# Patient Record
Sex: Female | Born: 1935 | Race: White | Hispanic: No | Marital: Married | Smoking: Never smoker
Health system: Southern US, Community
[De-identification: ages and names within clinical notes are randomized; demographics above are authoritative.]

## PROBLEM LIST (undated history)

## (undated) DIAGNOSIS — J449 Chronic obstructive pulmonary disease, unspecified: Secondary | ICD-10-CM

## (undated) DIAGNOSIS — C4490 Unspecified malignant neoplasm of skin, unspecified: Secondary | ICD-10-CM

## (undated) DIAGNOSIS — K649 Unspecified hemorrhoids: Secondary | ICD-10-CM

## (undated) DIAGNOSIS — K8050 Calculus of bile duct without cholangitis or cholecystitis without obstruction: Secondary | ICD-10-CM

## (undated) DIAGNOSIS — C50919 Malignant neoplasm of unspecified site of unspecified female breast: Secondary | ICD-10-CM

## (undated) DIAGNOSIS — D696 Thrombocytopenia, unspecified: Secondary | ICD-10-CM

## (undated) DIAGNOSIS — D649 Anemia, unspecified: Secondary | ICD-10-CM

## (undated) DIAGNOSIS — I4891 Unspecified atrial fibrillation: Secondary | ICD-10-CM

## (undated) DIAGNOSIS — Z923 Personal history of irradiation: Secondary | ICD-10-CM

## (undated) DIAGNOSIS — E538 Deficiency of other specified B group vitamins: Secondary | ICD-10-CM

## (undated) DIAGNOSIS — R2681 Unsteadiness on feet: Secondary | ICD-10-CM

## (undated) DIAGNOSIS — K225 Diverticulum of esophagus, acquired: Secondary | ICD-10-CM

## (undated) DIAGNOSIS — H269 Unspecified cataract: Secondary | ICD-10-CM

## (undated) DIAGNOSIS — M810 Age-related osteoporosis without current pathological fracture: Secondary | ICD-10-CM

## (undated) DIAGNOSIS — D72810 Lymphocytopenia: Secondary | ICD-10-CM

## (undated) DIAGNOSIS — C50911 Malignant neoplasm of unspecified site of right female breast: Secondary | ICD-10-CM

## (undated) DIAGNOSIS — D61818 Other pancytopenia: Secondary | ICD-10-CM

## (undated) DIAGNOSIS — J383 Other diseases of vocal cords: Secondary | ICD-10-CM

## (undated) DIAGNOSIS — M1990 Unspecified osteoarthritis, unspecified site: Secondary | ICD-10-CM

## (undated) HISTORY — DX: Unspecified cataract: H26.9

## (undated) HISTORY — DX: Diverticulum of esophagus, acquired: K22.5

## (undated) HISTORY — DX: Other diseases of vocal cords: J38.3

## (undated) HISTORY — DX: Malignant neoplasm of unspecified site of unspecified female breast: C50.919

## (undated) HISTORY — PX: BREAST EXCISIONAL BIOPSY: SUR124

## (undated) HISTORY — DX: Age-related osteoporosis without current pathological fracture: M81.0

## (undated) HISTORY — DX: Unspecified malignant neoplasm of skin, unspecified: C44.90

## (undated) HISTORY — DX: Thrombocytopenia, unspecified: D69.6

## (undated) HISTORY — DX: Other pancytopenia: D61.818

## (undated) HISTORY — PX: ROTATOR CUFF REPAIR: SHX139

## (undated) HISTORY — DX: Unspecified atrial fibrillation: I48.91

## (undated) HISTORY — PX: REPLACEMENT TOTAL KNEE: SUR1224

## (undated) HISTORY — DX: Deficiency of other specified B group vitamins: E53.8

## (undated) HISTORY — DX: Unspecified osteoarthritis, unspecified site: M19.90

## (undated) HISTORY — DX: Unspecified hemorrhoids: K64.9

## (undated) HISTORY — DX: Lymphocytopenia: D72.810

## (undated) HISTORY — DX: Malignant neoplasm of unspecified site of right female breast: C50.911

## (undated) HISTORY — DX: Anemia, unspecified: D64.9

---

## 2003-10-20 ENCOUNTER — Other Ambulatory Visit: Payer: Self-pay

## 2004-11-07 ENCOUNTER — Ambulatory Visit: Payer: Self-pay | Admitting: Unknown Physician Specialty

## 2005-07-04 ENCOUNTER — Ambulatory Visit: Payer: Self-pay | Admitting: Internal Medicine

## 2005-10-08 ENCOUNTER — Ambulatory Visit: Payer: Self-pay | Admitting: Unknown Physician Specialty

## 2006-07-31 ENCOUNTER — Ambulatory Visit: Payer: Self-pay | Admitting: Internal Medicine

## 2006-08-18 ENCOUNTER — Ambulatory Visit: Payer: Self-pay | Admitting: Ophthalmology

## 2006-09-17 ENCOUNTER — Ambulatory Visit: Payer: Self-pay | Admitting: Ophthalmology

## 2006-12-04 ENCOUNTER — Ambulatory Visit: Payer: Self-pay | Admitting: Internal Medicine

## 2006-12-06 ENCOUNTER — Emergency Department: Payer: Self-pay | Admitting: Emergency Medicine

## 2006-12-06 ENCOUNTER — Other Ambulatory Visit: Payer: Self-pay

## 2006-12-11 ENCOUNTER — Ambulatory Visit: Payer: Self-pay | Admitting: Internal Medicine

## 2007-01-01 ENCOUNTER — Other Ambulatory Visit: Payer: Self-pay

## 2007-01-01 ENCOUNTER — Inpatient Hospital Stay: Payer: Self-pay | Admitting: Rheumatology

## 2007-06-22 DIAGNOSIS — C4492 Squamous cell carcinoma of skin, unspecified: Secondary | ICD-10-CM

## 2007-06-22 HISTORY — DX: Squamous cell carcinoma of skin, unspecified: C44.92

## 2007-08-03 ENCOUNTER — Ambulatory Visit: Payer: Self-pay | Admitting: Internal Medicine

## 2007-09-01 ENCOUNTER — Ambulatory Visit: Payer: Self-pay

## 2007-12-14 ENCOUNTER — Ambulatory Visit: Payer: Self-pay | Admitting: Internal Medicine

## 2007-12-31 ENCOUNTER — Ambulatory Visit: Payer: Self-pay | Admitting: General Practice

## 2007-12-31 ENCOUNTER — Other Ambulatory Visit: Payer: Self-pay

## 2008-01-13 ENCOUNTER — Ambulatory Visit: Payer: Self-pay | Admitting: General Practice

## 2008-04-08 ENCOUNTER — Ambulatory Visit: Payer: Self-pay | Admitting: General Practice

## 2008-05-17 DIAGNOSIS — L570 Actinic keratosis: Secondary | ICD-10-CM

## 2008-05-17 HISTORY — DX: Actinic keratosis: L57.0

## 2008-08-03 ENCOUNTER — Ambulatory Visit: Payer: Self-pay | Admitting: Internal Medicine

## 2009-02-06 ENCOUNTER — Ambulatory Visit: Payer: Self-pay | Admitting: Pain Medicine

## 2009-02-21 ENCOUNTER — Ambulatory Visit: Payer: Self-pay | Admitting: Pain Medicine

## 2009-03-07 ENCOUNTER — Ambulatory Visit: Payer: Self-pay | Admitting: Physician Assistant

## 2009-04-26 ENCOUNTER — Ambulatory Visit: Payer: Self-pay | Admitting: Unknown Physician Specialty

## 2009-05-30 ENCOUNTER — Ambulatory Visit: Payer: Self-pay | Admitting: Pain Medicine

## 2009-06-14 ENCOUNTER — Ambulatory Visit: Payer: Self-pay | Admitting: Physician Assistant

## 2009-08-08 DIAGNOSIS — C4491 Basal cell carcinoma of skin, unspecified: Secondary | ICD-10-CM

## 2009-08-08 HISTORY — DX: Basal cell carcinoma of skin, unspecified: C44.91

## 2009-09-06 ENCOUNTER — Ambulatory Visit: Payer: Self-pay | Admitting: Internal Medicine

## 2009-09-19 ENCOUNTER — Ambulatory Visit: Payer: Self-pay | Admitting: Internal Medicine

## 2009-10-02 ENCOUNTER — Ambulatory Visit: Payer: Self-pay | Admitting: Pain Medicine

## 2009-10-17 ENCOUNTER — Ambulatory Visit: Payer: Self-pay | Admitting: Pain Medicine

## 2009-10-24 ENCOUNTER — Ambulatory Visit: Payer: Self-pay | Admitting: Pain Medicine

## 2009-11-07 ENCOUNTER — Ambulatory Visit: Payer: Self-pay | Admitting: Pain Medicine

## 2009-11-07 ENCOUNTER — Other Ambulatory Visit: Payer: Self-pay | Admitting: Pain Medicine

## 2009-11-27 ENCOUNTER — Ambulatory Visit: Payer: Self-pay | Admitting: Pain Medicine

## 2010-01-21 HISTORY — PX: OTHER SURGICAL HISTORY: SHX169

## 2010-02-09 ENCOUNTER — Encounter: Payer: Self-pay | Admitting: Internal Medicine

## 2010-02-14 ENCOUNTER — Ambulatory Visit: Payer: Self-pay | Admitting: Internal Medicine

## 2010-02-21 ENCOUNTER — Encounter: Payer: Self-pay | Admitting: Internal Medicine

## 2010-03-02 ENCOUNTER — Emergency Department: Payer: Self-pay | Admitting: Emergency Medicine

## 2010-03-06 ENCOUNTER — Ambulatory Visit: Payer: Self-pay | Admitting: Internal Medicine

## 2010-03-14 ENCOUNTER — Emergency Department: Payer: Self-pay | Admitting: Emergency Medicine

## 2010-03-23 ENCOUNTER — Encounter: Payer: Self-pay | Admitting: Internal Medicine

## 2010-04-23 ENCOUNTER — Encounter: Payer: Self-pay | Admitting: Internal Medicine

## 2010-09-26 ENCOUNTER — Ambulatory Visit: Payer: Self-pay | Admitting: Internal Medicine

## 2010-12-17 ENCOUNTER — Encounter: Payer: Self-pay | Admitting: Internal Medicine

## 2010-12-23 ENCOUNTER — Encounter: Payer: Self-pay | Admitting: Internal Medicine

## 2011-01-23 ENCOUNTER — Ambulatory Visit: Payer: Self-pay | Admitting: Unknown Physician Specialty

## 2011-03-21 ENCOUNTER — Ambulatory Visit: Payer: Self-pay

## 2011-10-29 DIAGNOSIS — I4891 Unspecified atrial fibrillation: Secondary | ICD-10-CM | POA: Insufficient documentation

## 2011-12-05 ENCOUNTER — Ambulatory Visit: Payer: Self-pay | Admitting: Internal Medicine

## 2012-04-16 ENCOUNTER — Ambulatory Visit: Payer: Self-pay | Admitting: General Practice

## 2012-06-04 ENCOUNTER — Ambulatory Visit: Payer: Self-pay | Admitting: General Practice

## 2012-06-04 DIAGNOSIS — I499 Cardiac arrhythmia, unspecified: Secondary | ICD-10-CM

## 2012-06-04 LAB — BASIC METABOLIC PANEL
BUN: 22 mg/dL — ABNORMAL HIGH (ref 7–18)
Calcium, Total: 9.2 mg/dL (ref 8.5–10.1)
Chloride: 110 mmol/L — ABNORMAL HIGH (ref 98–107)
Co2: 27 mmol/L (ref 21–32)
EGFR (Non-African Amer.): 55 — ABNORMAL LOW
Glucose: 93 mg/dL (ref 65–99)
Osmolality: 288 (ref 275–301)
Potassium: 3.9 mmol/L (ref 3.5–5.1)
Sodium: 143 mmol/L (ref 136–145)

## 2012-06-04 LAB — CBC WITH DIFFERENTIAL/PLATELET
Basophil %: 0.4 %
Eosinophil #: 0.1 10*3/uL (ref 0.0–0.7)
Eosinophil %: 2.4 %
Lymphocyte #: 0.9 10*3/uL — ABNORMAL LOW (ref 1.0–3.6)
MCH: 31.2 pg (ref 26.0–34.0)
MCHC: 33 g/dL (ref 32.0–36.0)
MCV: 94 fL (ref 80–100)
Monocyte #: 0.5 x10 3/mm (ref 0.2–0.9)
Neutrophil #: 2.6 10*3/uL (ref 1.4–6.5)
Neutrophil %: 61.8 %
Platelet: 151 10*3/uL (ref 150–440)
RDW: 12.9 % (ref 11.5–14.5)

## 2012-06-19 ENCOUNTER — Ambulatory Visit: Payer: Self-pay | Admitting: General Practice

## 2012-06-19 LAB — PROTIME-INR: Prothrombin Time: 13.4 secs (ref 11.5–14.7)

## 2012-12-07 ENCOUNTER — Ambulatory Visit: Payer: Self-pay | Admitting: Internal Medicine

## 2012-12-28 ENCOUNTER — Ambulatory Visit: Payer: Self-pay | Admitting: General Practice

## 2012-12-28 LAB — CBC
HCT: 38.3 % (ref 35.0–47.0)
MCH: 31.1 pg (ref 26.0–34.0)
MCHC: 33.2 g/dL (ref 32.0–36.0)
MCV: 94 fL (ref 80–100)
Platelet: 178 10*3/uL (ref 150–440)
RDW: 13.6 % (ref 11.5–14.5)

## 2012-12-28 LAB — URINALYSIS, COMPLETE
Bacteria: NONE SEEN
Bilirubin,UR: NEGATIVE
Blood: NEGATIVE
Glucose,UR: NEGATIVE mg/dL (ref 0–75)
Ketone: NEGATIVE
Leukocyte Esterase: NEGATIVE
Ph: 6 (ref 4.5–8.0)
RBC,UR: NONE SEEN /HPF (ref 0–5)

## 2012-12-28 LAB — BASIC METABOLIC PANEL
BUN: 17 mg/dL (ref 7–18)
Calcium, Total: 8.6 mg/dL (ref 8.5–10.1)
Chloride: 106 mmol/L (ref 98–107)
Co2: 29 mmol/L (ref 21–32)
Creatinine: 0.9 mg/dL (ref 0.60–1.30)
EGFR (Non-African Amer.): >60
Glucose: 78 mg/dL (ref 65–99)
Osmolality: 278 (ref 275–301)
Sodium: 139 mmol/L (ref 136–145)

## 2012-12-28 LAB — PROTIME-INR: Prothrombin Time: 29 secs — ABNORMAL HIGH (ref 11.5–14.7)

## 2012-12-28 LAB — MRSA PCR SCREENING

## 2013-01-11 ENCOUNTER — Inpatient Hospital Stay: Payer: Self-pay | Admitting: General Practice

## 2013-01-11 LAB — PROTIME-INR
INR: 0.9
Prothrombin Time: 12.6 secs (ref 11.5–14.7)

## 2013-01-12 LAB — BASIC METABOLIC PANEL
Anion Gap: 4 — ABNORMAL LOW (ref 7–16)
Calcium, Total: 7.9 mg/dL — ABNORMAL LOW (ref 8.5–10.1)
Chloride: 105 mmol/L (ref 98–107)
Co2: 28 mmol/L (ref 21–32)
Creatinine: 0.92 mg/dL (ref 0.60–1.30)
EGFR (African American): >60
EGFR (Non-African Amer.): 59 — ABNORMAL LOW
Glucose: 91 mg/dL (ref 65–99)
Osmolality: 273 (ref 275–301)
Sodium: 137 mmol/L (ref 136–145)

## 2013-01-12 LAB — PROTIME-INR: Prothrombin Time: 14.4 secs (ref 11.5–14.7)

## 2013-01-12 LAB — PLATELET COUNT: Platelet: 157 10*3/uL (ref 150–440)

## 2013-01-13 LAB — BASIC METABOLIC PANEL
BUN: 10 mg/dL (ref 7–18)
Creatinine: 0.85 mg/dL (ref 0.60–1.30)
EGFR (African American): >60
EGFR (Non-African Amer.): >60
Glucose: 90 mg/dL (ref 65–99)

## 2013-01-13 LAB — PLATELET COUNT: Platelet: 170 10*3/uL (ref 150–440)

## 2013-01-13 LAB — HEMOGLOBIN: HGB: 10.8 g/dL — ABNORMAL LOW (ref 12.0–16.0)

## 2013-01-13 LAB — PROTIME-INR: INR: 2.1

## 2013-09-23 DIAGNOSIS — C50919 Malignant neoplasm of unspecified site of unspecified female breast: Secondary | ICD-10-CM

## 2013-09-23 HISTORY — DX: Malignant neoplasm of unspecified site of unspecified female breast: C50.919

## 2013-09-23 HISTORY — PX: BREAST LUMPECTOMY: SHX2

## 2013-09-23 HISTORY — PX: BREAST EXCISIONAL BIOPSY: SUR124

## 2014-01-07 ENCOUNTER — Ambulatory Visit: Payer: Self-pay | Admitting: Internal Medicine

## 2014-01-10 ENCOUNTER — Other Ambulatory Visit: Payer: Self-pay | Admitting: Internal Medicine

## 2014-01-14 ENCOUNTER — Ambulatory Visit: Payer: Self-pay | Admitting: Internal Medicine

## 2014-01-15 LAB — CULTURE, BLOOD (SINGLE)

## 2014-01-27 ENCOUNTER — Ambulatory Visit: Payer: Self-pay | Admitting: Internal Medicine

## 2014-01-27 HISTORY — PX: OTHER SURGICAL HISTORY: SHX169

## 2014-02-02 LAB — PATHOLOGY REPORT

## 2014-02-08 ENCOUNTER — Ambulatory Visit: Payer: Self-pay | Admitting: Internal Medicine

## 2014-02-09 ENCOUNTER — Ambulatory Visit: Payer: Self-pay | Admitting: Surgery

## 2014-02-17 ENCOUNTER — Ambulatory Visit: Payer: Self-pay | Admitting: Surgery

## 2014-02-17 LAB — PROTIME-INR
INR: 1
Prothrombin Time: 13.4 secs (ref 11.5–14.7)

## 2014-02-21 ENCOUNTER — Ambulatory Visit: Payer: Self-pay | Admitting: Internal Medicine

## 2014-02-22 LAB — PATHOLOGY REPORT

## 2014-03-11 ENCOUNTER — Ambulatory Visit: Payer: Self-pay | Admitting: Internal Medicine

## 2014-03-23 ENCOUNTER — Ambulatory Visit: Payer: Self-pay | Admitting: Internal Medicine

## 2014-03-23 DIAGNOSIS — M810 Age-related osteoporosis without current pathological fracture: Secondary | ICD-10-CM

## 2014-03-23 HISTORY — DX: Age-related osteoporosis without current pathological fracture: M81.0

## 2014-03-31 LAB — CBC CANCER CENTER
Basophil #: 0 x10 3/mm (ref 0.0–0.1)
Basophil %: 0.5 %
Eosinophil #: 0.1 x10 3/mm (ref 0.0–0.7)
Eosinophil %: 3 %
HCT: 37.1 % (ref 35.0–47.0)
HGB: 11.9 g/dL — AB (ref 12.0–16.0)
LYMPHS ABS: 0.8 x10 3/mm — AB (ref 1.0–3.6)
LYMPHS PCT: 20.9 %
MCH: 28.1 pg (ref 26.0–34.0)
MCHC: 32.2 g/dL (ref 32.0–36.0)
MCV: 87 fL (ref 80–100)
Monocyte #: 0.4 x10 3/mm (ref 0.2–0.9)
Monocyte %: 10.3 %
Neutrophil #: 2.5 x10 3/mm (ref 1.4–6.5)
Neutrophil %: 65.3 %
Platelet: 166 x10 3/mm (ref 150–440)
RBC: 4.25 10*6/uL (ref 3.80–5.20)
RDW: 15.5 % — AB (ref 11.5–14.5)
WBC: 3.8 x10 3/mm (ref 3.6–11.0)

## 2014-04-14 LAB — CBC CANCER CENTER
BASOS PCT: 0.7 %
Basophil #: 0 x10 3/mm (ref 0.0–0.1)
EOS ABS: 0.1 x10 3/mm (ref 0.0–0.7)
Eosinophil %: 3.7 %
HCT: 38.2 % (ref 35.0–47.0)
HGB: 12.4 g/dL (ref 12.0–16.0)
LYMPHS ABS: 0.7 x10 3/mm — AB (ref 1.0–3.6)
Lymphocyte %: 20.9 %
MCH: 28.6 pg (ref 26.0–34.0)
MCHC: 32.5 g/dL (ref 32.0–36.0)
MCV: 88 fL (ref 80–100)
MONOS PCT: 10.5 %
Monocyte #: 0.3 x10 3/mm (ref 0.2–0.9)
Neutrophil #: 2.1 x10 3/mm (ref 1.4–6.5)
Neutrophil %: 64.2 %
Platelet: 167 x10 3/mm (ref 150–440)
RBC: 4.35 10*6/uL (ref 3.80–5.20)
RDW: 15.2 % — ABNORMAL HIGH (ref 11.5–14.5)
WBC: 3.3 x10 3/mm — ABNORMAL LOW (ref 3.6–11.0)

## 2014-04-21 LAB — CBC CANCER CENTER
Basophil #: 0 x10 3/mm (ref 0.0–0.1)
Basophil %: 0.6 %
Eosinophil #: 0.1 x10 3/mm (ref 0.0–0.7)
Eosinophil %: 2.6 %
HCT: 38.5 % (ref 35.0–47.0)
HGB: 12.3 g/dL (ref 12.0–16.0)
LYMPHS PCT: 15.9 %
Lymphocyte #: 0.6 x10 3/mm — ABNORMAL LOW (ref 1.0–3.6)
MCH: 28.4 pg (ref 26.0–34.0)
MCHC: 32 g/dL (ref 32.0–36.0)
MCV: 89 fL (ref 80–100)
Monocyte #: 0.3 x10 3/mm (ref 0.2–0.9)
Monocyte %: 8.6 %
Neutrophil #: 2.6 x10 3/mm (ref 1.4–6.5)
Neutrophil %: 72.3 %
Platelet: 168 x10 3/mm (ref 150–440)
RBC: 4.35 10*6/uL (ref 3.80–5.20)
RDW: 15.4 % — ABNORMAL HIGH (ref 11.5–14.5)
WBC: 3.6 x10 3/mm (ref 3.6–11.0)

## 2014-04-23 ENCOUNTER — Ambulatory Visit: Payer: Self-pay | Admitting: Internal Medicine

## 2014-05-03 LAB — CBC CANCER CENTER
Basophil #: 0 x10 3/mm (ref 0.0–0.1)
Basophil %: 0.6 %
Eosinophil #: 0.1 x10 3/mm (ref 0.0–0.7)
Eosinophil %: 3 %
HCT: 37.7 % (ref 35.0–47.0)
HGB: 12.3 g/dL (ref 12.0–16.0)
LYMPHS PCT: 14.9 %
Lymphocyte #: 0.6 x10 3/mm — ABNORMAL LOW (ref 1.0–3.6)
MCH: 29 pg (ref 26.0–34.0)
MCHC: 32.6 g/dL (ref 32.0–36.0)
MCV: 89 fL (ref 80–100)
MONOS PCT: 10.1 %
Monocyte #: 0.4 x10 3/mm (ref 0.2–0.9)
NEUTROS PCT: 71.4 %
Neutrophil #: 2.7 x10 3/mm (ref 1.4–6.5)
Platelet: 151 x10 3/mm (ref 150–440)
RBC: 4.25 10*6/uL (ref 3.80–5.20)
RDW: 15.4 % — AB (ref 11.5–14.5)
WBC: 3.8 x10 3/mm (ref 3.6–11.0)

## 2014-05-03 LAB — HEPATIC FUNCTION PANEL A (ARMC)
ALK PHOS: 70 U/L
AST: 26 U/L (ref 15–37)
Albumin: 3 g/dL — ABNORMAL LOW (ref 3.4–5.0)
Bilirubin,Total: 0.4 mg/dL (ref 0.2–1.0)
SGPT (ALT): 20 U/L
Total Protein: 6.7 g/dL (ref 6.4–8.2)

## 2014-05-03 LAB — CREATININE, SERUM
CREATININE: 1.21 mg/dL (ref 0.60–1.30)
EGFR (Non-African Amer.): 42 — ABNORMAL LOW
GFR CALC AF AMER: 49 — AB

## 2014-05-24 ENCOUNTER — Ambulatory Visit: Payer: Self-pay | Admitting: Internal Medicine

## 2014-08-13 ENCOUNTER — Inpatient Hospital Stay: Payer: Self-pay | Admitting: Internal Medicine

## 2014-08-13 LAB — COMPREHENSIVE METABOLIC PANEL
ALBUMIN: 3.2 g/dL — AB (ref 3.4–5.0)
ANION GAP: 11 (ref 7–16)
AST: 32 U/L (ref 15–37)
Alkaline Phosphatase: 50 U/L
BUN: 12 mg/dL (ref 7–18)
Bilirubin,Total: 0.4 mg/dL (ref 0.2–1.0)
Calcium, Total: 8 mg/dL — ABNORMAL LOW (ref 8.5–10.1)
Chloride: 110 mmol/L — ABNORMAL HIGH (ref 98–107)
Co2: 25 mmol/L (ref 21–32)
Creatinine: 1.08 mg/dL (ref 0.60–1.30)
EGFR (African American): >60
EGFR (Non-African Amer.): 52 — ABNORMAL LOW
Glucose: 123 mg/dL — ABNORMAL HIGH (ref 65–99)
Osmolality: 292 (ref 275–301)
POTASSIUM: 3.3 mmol/L — AB (ref 3.5–5.1)
SGPT (ALT): 22 U/L
SODIUM: 146 mmol/L — AB (ref 136–145)
Total Protein: 6.9 g/dL (ref 6.4–8.2)

## 2014-08-13 LAB — URINALYSIS, COMPLETE
Glucose,UR: NEGATIVE mg/dL (ref 0–75)
KETONE: NEGATIVE
LEUKOCYTE ESTERASE: NEGATIVE
Nitrite: NEGATIVE
Ph: 7 (ref 4.5–8.0)
Protein: NEGATIVE
RBC,UR: <1 /HPF (ref 0–5)
SPECIFIC GRAVITY: 1.003 (ref 1.003–1.030)
Squamous Epithelial: NONE SEEN

## 2014-08-13 LAB — PROTIME-INR
INR: 1.4
Prothrombin Time: 17.3 secs — ABNORMAL HIGH (ref 11.5–14.7)

## 2014-08-13 LAB — APTT: Activated PTT: 33.4 secs (ref 23.6–35.9)

## 2014-08-13 LAB — CBC
HCT: 41.7 % (ref 35.0–47.0)
HGB: 13.6 g/dL (ref 12.0–16.0)
MCH: 30.2 pg (ref 26.0–34.0)
MCHC: 32.7 g/dL (ref 32.0–36.0)
MCV: 93 fL (ref 80–100)
PLATELETS: 141 10*3/uL — AB (ref 150–440)
RBC: 4.5 10*6/uL (ref 3.80–5.20)
RDW: 13.5 % (ref 11.5–14.5)
WBC: 4 10*3/uL (ref 3.6–11.0)

## 2014-08-13 LAB — TSH: THYROID STIMULATING HORM: 5.26 u[IU]/mL — AB

## 2014-08-13 LAB — MAGNESIUM: Magnesium: 1.8 mg/dL

## 2014-08-13 LAB — TROPONIN I

## 2014-08-14 LAB — BASIC METABOLIC PANEL
ANION GAP: 8 (ref 7–16)
BUN: 9 mg/dL (ref 7–18)
CREATININE: 0.9 mg/dL (ref 0.60–1.30)
Calcium, Total: 8 mg/dL — ABNORMAL LOW (ref 8.5–10.1)
Chloride: 114 mmol/L — ABNORMAL HIGH (ref 98–107)
Co2: 25 mmol/L (ref 21–32)
EGFR (African American): >60
EGFR (Non-African Amer.): >60
Glucose: 91 mg/dL (ref 65–99)
Osmolality: 291 (ref 275–301)
Potassium: 3.9 mmol/L (ref 3.5–5.1)
Sodium: 147 mmol/L — ABNORMAL HIGH (ref 136–145)

## 2014-08-14 LAB — CBC WITH DIFFERENTIAL/PLATELET
BASOS ABS: 0 10*3/uL (ref 0.0–0.1)
Basophil %: 0.9 %
Eosinophil #: 0.1 10*3/uL (ref 0.0–0.7)
Eosinophil %: 2.1 %
HCT: 35.6 % (ref 35.0–47.0)
HGB: 11.8 g/dL — AB (ref 12.0–16.0)
LYMPHS PCT: 27.1 %
Lymphocyte #: 0.8 10*3/uL — ABNORMAL LOW (ref 1.0–3.6)
MCH: 30.3 pg (ref 26.0–34.0)
MCHC: 33.2 g/dL (ref 32.0–36.0)
MCV: 92 fL (ref 80–100)
Monocyte #: 0.3 x10 3/mm (ref 0.2–0.9)
Monocyte %: 10 %
NEUTROS ABS: 1.9 10*3/uL (ref 1.4–6.5)
Neutrophil %: 59.9 %
Platelet: 120 10*3/uL — ABNORMAL LOW (ref 150–440)
RBC: 3.89 10*6/uL (ref 3.80–5.20)
RDW: 13.7 % (ref 11.5–14.5)
WBC: 3.1 10*3/uL — ABNORMAL LOW (ref 3.6–11.0)

## 2014-08-14 LAB — TROPONIN I: Troponin-I: 0.02 ng/mL

## 2014-08-14 LAB — PROTIME-INR
INR: 1.6
PROTHROMBIN TIME: 19.1 s — AB (ref 11.5–14.7)

## 2014-08-14 LAB — T4, FREE: Free Thyroxine: 1.12 ng/dL (ref 0.76–1.46)

## 2014-08-15 LAB — PROTIME-INR
INR: 1.8
PROTHROMBIN TIME: 20.5 s — AB (ref 11.5–14.7)

## 2014-08-29 DIAGNOSIS — Z853 Personal history of malignant neoplasm of breast: Secondary | ICD-10-CM | POA: Insufficient documentation

## 2014-11-09 ENCOUNTER — Ambulatory Visit: Payer: Self-pay | Admitting: Internal Medicine

## 2014-11-22 ENCOUNTER — Ambulatory Visit
Admit: 2014-11-22 | Disposition: A | Discharge status: Home / Self Care | Payer: Self-pay | Attending: Internal Medicine | Admitting: Internal Medicine

## 2014-11-23 ENCOUNTER — Ambulatory Visit: Payer: Self-pay | Admitting: Internal Medicine

## 2015-01-09 ENCOUNTER — Ambulatory Visit
Admit: 2015-01-09 | Disposition: A | Discharge status: Home / Self Care | Payer: Self-pay | Attending: Internal Medicine | Admitting: Internal Medicine

## 2015-01-10 NOTE — Op Note (Signed)
PATIENT NAME:  Kelly Bishop, Kelly Bishop MR#:  [id number] DATE OF BIRTH:  March 25, 1932  DATE OF PROCEDURE:  06/19/2012  PREOPERATIVE DIAGNOSIS: Internal derangement of left knee.   POSTOPERATIVE DIAGNOSES:  1. Tear of the posterior horn medial meniscus, left knee.  2. Tear of the anterior and posterior horns of the lateral meniscus, left knee.  3. 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  4. 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   PROCEDURES PERFORMED: Left knee arthroscopy, partial medial and lateral meniscectomies and chondroplasty of all three compartments.   SURGEON: Laurice Record. Holley Bouche., MD  ANESTHESIA: General.   ESTIMATED BLOOD LOSS: Minimal.   TOURNIQUET TIME: Not used.   DRAINS: None.   INDICATIONS FOR SURGERY: The patient is a 81 year old female who has been seen for complaints of left knee pain and swelling. MRI demonstrated findings consistent with meniscal pathology. After discussion of the risks and benefits of surgical intervention, the patient expressed her understanding of the risks and benefits and agreed with plans for surgical intervention.   PROCEDURE IN DETAIL: Patient was brought into the Operating Room and, after adequate general anesthesia was achieved, tourniquet was placed on patient's left thigh and leg was placed in a leg holder. All bony prominences were well padded. Patient's left knee and leg were cleaned and prepped with alcohol and DuraPrep, draped in the usual sterile fashion. A "timeout" was performed as per usual protocol. The anticipated portal sites were injected with 0.25% Marcaine with epinephrine. An anterolateral portal was created and a cannula was inserted. A moderate effusion was evacuated. The scope was inserted and the knee was distended with fluid using the DePuy Mitek pump. The scope was advanced down the medial gutter into the medial compartment of the knee. Under visualization with the scope, an anteromedial  portal was created and a hook probe was inserted. Inspection of the knee demonstrated a tear of the posterior horn of the medial meniscus which was degenerative in nature. The tear was debrided using meniscal punches and a 4.5 mm shaver. Final contouring was performed using the 50 degree ArthroCare wand. Anterior horn of the medial meniscus was visualized and probed and felt to be stable. There were grade 3 changes of chondromalacia involving primarily the medial femoral condyle. These were debrided and contoured using the 50 degree ArthroCare wand. Scope was then advanced into the intercondylar region. Anterior cruciate ligament was visualized and probed and felt to stable. The scope was removed from the anterolateral portal and reinserted via the anteromedial portal so as to better visualize the lateral compartment. There was a complex degenerative tear involving both the posterior and anterior horns of the lateral meniscus. The tears were debrided using meniscal punches and a 4.5 mm shaver. Final contouring was performed using the ArthroCare wand. The rim of meniscus was visualized and probed and felt to be stable. Of note there was a large area of grade 4 chondromalacia involving the majority of lateral tibial plateau and lateral femoral condyle. The margins were debrided and contoured using the ArthroCare wand. The scope was then positioned so as to visualize the patellofemoral articulation. Grade 3 changes of chondromalacia were noted to the patella as well as along the trochlear groove. These areas were debrided and contoured using the ArthroCare wand. Good patellar tracking was noted.   The knee was irrigated with copious amounts of fluid and then suctioned dry. The anterolateral portal was reapproximated using 3-0 nylon. A combination of 0.25% Marcaine with epinephrine and  4 mg of morphine was injected via the scope. The scope was removed and the anteromedial portal was reapproximated using 3-0 nylon. A  sterile dressing was applied followed by application of an ice wrap.   Patient tolerated procedure well. She was transported to the recovery room in stable condition.    ____________________________ Laurice Record. Holley Bouche., MD jph:cms D: 06/19/2012 82:70:78 ET T: 06/19/2012 17:45:58 ET JOB#: [id number]  cc: Jeneen Rinks P. Holley Bouche., MD, <Dictator>  Laurice Record Holley Bouche MD ELECTRONICALLY SIGNED 06/24/2012 18:25

## 2015-01-13 NOTE — Op Note (Signed)
PATIENT NAME:  Kelly Bishop, Kelly Bishop MR#:  [id number] DATE OF BIRTH:  10-24-1931  DATE OF PROCEDURE:  01/11/2013  PREOPERATIVE DIAGNOSIS: Degenerative arthrosis of the left knee.   POSTOPERATIVE DIAGNOSIS: Degenerative arthrosis of the left knee.   PROCEDURE PERFORMED: Left total knee arthroplasty using computer-assisted navigation.   SURGEON: Laurice Record. Holley Bouche., MD  ASSISTANT: Vance Peper, PA  ANESTHESIA: Femoral nerve block and spinal.   ESTIMATED BLOOD LOSS: 50 mL.   FLUIDS REPLACED: 1600 mL of crystalloid.   TOURNIQUET TIME: 79 minutes.   DRAINS: Two medium drains to reinfusion system.   SOFT TISSUE RELEASES: Anterior cruciate ligament, posterior cruciate ligament, deep medial collateral ligament, posterolateral corner and patellofemoral ligament.   IMPLANTS UTILIZED: DePuy PFC Sigma size 2.5 posterior stabilized femoral component (cemented), size 2 MBT tibial component (cemented), 32 mm 3 peg oval dome patella (cemented), and a 10 mm stabilized rotating platform polyethylene insert.   INDICATIONS FOR SURGERY: The patient is a 81 year old female who has been seen for complaints of progressive left knee pain. X-rays demonstrated severe degenerative changes in tricompartmental fashion with relative valgus deformity. After discussion of the risks and benefits of surgical intervention, the patient expressed understanding of the risks and benefits and agreed with plans for surgical intervention.   PROCEDURE IN DETAIL: The patient was brought into the operating room, and after adequate femoral nerve block and spinal anesthesia was achieved, a tourniquet was placed on the patient's upper left thigh. The patient's left knee and leg were cleaned and prepped with alcohol and DuraPrep and draped in the usual sterile fashion. A "timeout" was performed as per usual protocol. The left lower extremity was exsanguinated using an Esmarch, and the tourniquet was inflated to 300 mmHg. An anterior  longitudinal incision was made followed by a standard mid vastus approach. A large effusion was evacuated. The deep fibers of the medial collateral ligament were elevated in a subperiosteal fashion off the medial flare of the tibia so as to maintain a continuous soft tissue sleeve. The patella was subluxed laterally, and the patellofemoral ligament was incised. Inspection of the knee demonstrated severe degenerative changes most notably to the lateral compartment. Anterior and posterior cruciate ligaments were excised. Prominent osteophytes were debrided using a rongeur. Two 4.0 mm Schanz pins were inserted into the femur and into the tibia for attachment of the array of trackers used for computer-assisted navigation. Hip center was identified using circumduction technique. Distal landmarks were mapped using the computer. The distal femur and proximal tibia were mapped using the computer. Distal femoral cutting guide was positioned using computer-assisted navigation so as to achieve 5 degree distal valgus alignment. Cut was performed and verified using the computer. Distal femur was then sized, and it was felt that a size 2.5 femoral component was appropriate. A size 2.5 cutting guide was positioned, and anterior cut was performed and verified using the computer. This was followed by completion of the posterior and chamfer cuts. The cutting guide for the central box was then positioned, and the central box cut was performed.   Attention was then directed to the proximal tibia. Medial and lateral menisci were excised. The extramedullary tibial cutting guide was positioned using computer-assisted navigation so as to achieve 0 degree varus and valgus alignment and 0 degree posterior slope. Cut was performed and verified using the computer. The proximal tibia was sized, and it was felt that a size 2 tibial tray was appropriate. Tibial and femoral trials were inserted, followed by insertion of a  10 mm polyethylene  insert. The knee was felt to be tight laterally. Trial components were removed, and the knee was placed in extension with Moreland retractors in place. The posterolateral corner was carefully released using a combination of electrocautery and Metzenbaum scissors. This allowed for good medial and lateral soft tissue balancing. Trial components were reinserted, followed by insertion of a 10 mm polyethylene trial. Again, excellent medial and lateral soft tissue balancing was appreciated. Finally, the patella was cut and prepared so as to accommodate a 32 mm 3 peg oval dome patella. Patellar trial was placed, and the knee was placed through a range of motion, with excellent patellar tracking appreciated.   The trial components were removed from the femoral side after debridement of posterior osteophytes. Central posthole for the tibial component was reamed, followed by insertion of a keel punch. Tibial trial was then removed. The cut surfaces of bone were irrigated with copious amounts of normal saline with antibiotic solution using pulsatile lavage and then suctioned dry. Polymethylmethacrylate cement was prepared in the usual fashion using a vacuum mixer. Cement was applied to the cut surface of the proximal tibia as well as along the undersurface of a size 2 MBT tibial component. The tibial component was positioned and impacted into place. Excess cement was removed using [employer] fast streamer. Cement was then applied to the cut surface of the femur as well as along the posterior flanges of a size 2.5 posterior stabilized femoral component. Femoral component was positioned and impacted into place. Excess cement was removed using [employer] fast streamer. A 10 mm polyethylene trial was inserted, and the knee was brought into full extension with steady axial pressure applied. Finally, cement was applied to the backside of a 32 mm 3 peg oval dome patella, and the patellar component was positioned and patellar clamp applied. Excess  cement was removed using [employer] fast streamer.   After adequate curing of cement, the tourniquet was deflated after a total tourniquet time of 79 minutes. Hemostasis was achieved using electrocautery. The knee was irrigated with copious amounts of normal saline with antibiotic solution using pulsatile lavage and then suctioned dry. The knee was inspected for any residual cement debris. Then, 30 mL of 0.25% Marcaine with epinephrine was injected along the posterior capsule. A 10 mm stabilized rotating platform polyethylene insert was inserted, and the knee was placed through range of motion. Excellent medial and lateral soft tissue balancing was appreciated both in full extension and in flexion. Excellent patellar tracking was appreciated. Two medium drains were placed in the wound bed and brought out through a separate stab incision to be attached to a reinfusion system. The medial parapatellar portion of the incision was reapproximated using interrupted sutures of #1 Vicryl. The subcutaneous tissue was approximated in layers using first #0 Vicryl, followed by #2-0 Vicryl. Skin was closed with skin staples. A sterile dressing was applied.   The patient tolerated the procedure well. She was transported to the recovery room in stable condition.    ____________________________ Laurice Record. Holley Bouche., MD jph:OSi D: 01/11/2013 21:32:25 ET T: 01/12/2013 07:31:07 ET JOB#: [id number]  cc: Laurice Record. Holley Bouche., MD, <Dictator> Laurice Record Holley Bouche MD ELECTRONICALLY SIGNED 01/17/2013 18:48

## 2015-01-13 NOTE — Discharge Summary (Signed)
PATIENT NAME:  Kelly Bishop, Kelly Bishop MR#:  [id number] DATE OF BIRTH:  Apr 01, 1932  DATE OF ADMISSION:  01/11/2013 DATE OF DISCHARGE:  01/13/2013   ADMITTING DIAGNOSIS: Degenerative arthrosis of the left knee.   DISCHARGE DIAGNOSIS: Degenerative arthrosis of the left knee.   HISTORY OF PRESENT ILLNESS:  The patient is a pleasant 81 year old female who has been followed at [facility] for progression of left knee pain. She had reported a several year history of progressive left knee pain. She had previously undergone a left knee arthroscopy for partial medial and lateral meniscectomy as well as chondroplasty in October 2013. At that time she was noted to have evidence of grade III chondromalacia to the medial patellofemoral compartment as well as grade IV chondromalacia to the lateral compartment. She has continued to have significant lateral joint line pain with weight-bearing activities. The patient had reported some giving way of the knee as well as some mild swelling. She has also noted some crepitus with range of motion. At the time of surgery, she was not using any ambulatory aid. The patient had not seen any significant improvement in her condition despite the use of Synvisc injections. She has been unable to tolerate anti-inflammatory medications due to anticoagulation with Coumadin. She has attempted to continue exercise program but states the pain is limiting her ability to exercise on a regular basis. The patient states the knee pain had progressed to the point that it was significantly interfering with her activities of daily living. X-rays taken in [place] showed narrowing of the lateral compartment space with associated valgus alignment. Osteophyte formation as well as subchondral sclerosis was noted. After discussion of the risks and benefits of surgical intervention, the patient expressed her understanding of the risks and benefits and agreed for plans for surgical  intervention.   PROCEDURE: Left total knee arthroplasty using computer-assisted navigation.   ANESTHESIA: Femoral nerve block with spinal.   SOFT TISSUE RELEASE:  Anterior cruciate ligament, posterior cruciate ligament, deep medial collateral ligaments, as well as the posterolateral corner and the patellofemoral ligament.   IMPLANTS UTILIZED:  DePuy PFC Sigma size 2.5 posterior stabilized femoral component (cemented), size 2 MBT tibial component (cemented), 32 mm 3-pegged oval dome patella (cemented), and a 10 mm stabilized rotating platform polyethylene insert.   HOSPITAL COURSE: The patient tolerated the procedure very well. She had no complications. She was then taken to the PAC-U where she was stabilized and then transferred to the orthopedic floor. The patient began receiving anticoagulation therapy of Lovenox 30 mg subcutaneous every 12 hours per anesthesia protocol as well as Coumadin 10 mg.  Her INR was followed on a daily basis. The INR on the day of discharge was 2.1. Subsequently the Lovenox was discontinued. She was placed back on her regular doses of 6 mg per day. The patient was fitted with TED stockings bilaterally. These are allowed to be removed 1 hour per 8 hour shift. She was also fitted with the AV-I compression foot pumps bilaterally set at 80 mmHg.  Her calves have been nontender. There has been no evidence of any DVTs. Negative Homans sign. Heels were elevated off the bed using rolled towels.   The patient has denied any chest pain or shortness of breath. Vital signs have been stable. She has been afebrile. Hemodynamically she was stable. No transfusions were given other than the Autovac transfusion given the first 6 hours postoperatively.   Physical therapy was initiated on day one for gait training and transfers.  She has done extremely well.  On day 2 she had a range of motion from 0 to 95 with patient ambulating greater than 200 feet. She was able to go up and down 4 sets of  steps. She was independent with bed to chair transfers. Occupational therapy was also initiated on day one for activities of daily living and assistive devices.   The patient's IV, Foley and Hemovac were discontinued on day 2 along with a dressing change. The wound was free of any drainage or signs of infection. There was very minimal swelling. Neurovascular and neurosensory was intact and within normal limits.   DRUG ALLERGIES:  BETA ADRENERGIC AGENTS, SAGE WIPES, which caused a rash, ACE inhibitors, codeine, morphine, peanuts.  MEDICATIONS:  Amiodarone 100 mg daily.  Vitamin D3 2000 units daily.  Aspirin 81 mg one enteric-coated aspirin per day. Claritin 10 mg 1 tablet b.i.d.  Coumadin 6 mg at bedtime.  Dulcolax laxatives 5 mg 2 tablets daily. Lasix 20 mg q.a.m.  Potassium chloride 20 mEq daily. Peri-Colace 50 mg 8.6, 1 tablet daily.  Tramadol 50 to 100 mg every 4 to 6 hours p.r.n. for pain.  Oxycodone 1 to 2 tablets every 4 to 6 hours p.r.n. for pain. Tylenol 500 mg 1 tablet every 4 hours p.r.n. for temperatures and pain p.r.n.  CONDITION ON DISCHARGE:  The patient is being discharged to home in improved stable condition.   DISCHARGE INSTRUCTIONS: 1.  She is to continue weight-bearing as tolerated.  2.  Continue wearing TED stockings the day but may be removed at night.  3.  Continue Polar Care maintaining a temperature of 40 to 50 degrees Fahrenheit pretty much around-the-clock for the first 2 weeks.  4.  She has appointment in the clinic on 01/26/2013 at 10:15.  5.  She is to call the clinic sooner if any temperatures of 101.5 or greater or excessively. 6.  She is to continue using a walker to continue further physical therapy to go to a quad cane.  7.  She will receive home health physical therapy.  8.  She is placed on a regular diet.  9.  She is to resume her regular medication that she was on prior to admission.   PAST MEDICAL HISTORY:  Seasonal allergies, arthritis, cataracts,  osteoporosis, cervical disk disease, probable fibrocystic breast disease, spastic dysphoria, colon adenomas, esophagitis, recurrent atrial fibrillation, right frontal meningioma, hyperlipidemia.     ____________________________ Vance Peper, PA jrw:ct D: 01/13/2013 11:08:01 ET T: 01/13/2013 11:29:00 ET JOB#: [id number]  cc: Vance Peper, PA, <Dictator> Laurice Record. Holley Bouche., MD Marland Reine PA ELECTRONICALLY SIGNED 01/13/2013 14:02

## 2015-01-14 NOTE — H&P (Signed)
PATIENT NAME:  Kelly Bishop, Kelly Bishop MR#:  [id number] DATE OF BIRTH:  June 11, 1932  DATE OF ADMISSION:  08/13/2014  REFERRING PHYSICIAN: Yetta Numbers. Karma Greaser, MD  PRIMARY CARE PHYSICIAN: [name], MD, [facility].   CARDIOLOGIST: Javier Docker Ubaldo Glassing, MD, [facility].   CHIEF COMPLAINT: Palpitations, shortness of breath.   HISTORY OF PRESENT ILLNESS: An 81 year old Caucasian female with a past medical history of paroxysmal atrial fibrillation, hypertension, hyperlipidemia, breast cancer, presenting with palpitations, shortness of breath. She describes the acute onset of palpitations today with associated shortness of breath as well as some associated chest tightness, although she mainly complained of neck tightness over her left neck. No frank chest pain, no further symptomatology. Brought to the hospital for further workup and evaluation. Found to be in atrial fibrillation with rapid ventricular response, heart rate between 150 and 160. Of note, she was taken off amiodarone, her rate-controlling agent, back in August 2015 secondary to an interaction with tamoxifen. She is on no other rate-controlling agents at this time and had no issues until now. She has no further complaints at this time. She has received 15 mg of IV Cardizem x 2 as well as 120 mg p.o. Cardizem. Rate is still around 120 or so; thus, she was started on a Cardizem drip in the Emergency Department.  REVIEW OF SYSTEMS: CONSTITUTIONAL: Denies fevers, chills, fatigue, weakness.  EYES: Denies blurred vision, double vision, eye pain.  EARS, NOSE, THROAT: Denies tinnitus, ear pain, hearing loss.  RESPIRATORY: Denies cough, wheeze. Positive shortness of breath as described above.  CARDIOVASCULAR: Positive for chest tightness as well as palpitations. Denies any orthopnea. Positive for edema, as well.  GASTROINTESTINAL: Denies nausea, vomiting, diarrhea, abdominal pain.  GENITOURINARY: Denies dysuria, hematuria.  ENDOCRINE: Denies nocturia  or thyroid problems. HEMATOLOGY AND LYMPHATIC: Denies easy bruising and bleeding.  SKIN: Denies rashes or lesions.  MUSCULOSKELETAL: Denies pain in neck, back, shoulder, knees, hips, or arthritic symptoms.  NEUROLOGIC: Denies paralysis, paresthesias.  PSYCHIATRIC: Denies anxiety or depressive symptoms.  Otherwise, full review of systems performed by me is negative.   PAST MEDICAL HISTORY: Paroxysmal atrial fibrillation, history of breast cancer, hyperlipidemia, essential hypertension.   SOCIAL HISTORY: Denies any alcohol, tobacco, or drug usage.  FAMILY HISTORY: Positive for coronary artery disease.   ALLERGIES: BETA ADRENERGIC AGENTS, ACE INHIBITORS, CODEINE, MORPHINE, AND PEANUTS.   HOME MEDICATIONS: Include Advil PM 25/200 mg 2 capsules p.o. at bedtime, aspirin 81 mg p.o. at bedtime, warfarin 6 mg p.o. daily, Allegra 180 mg p.o. daily, tamoxifen 20 mg p.o. daily, Ambien 5 mg p.o. at bedtime as needed for sleep, Lasix 40 mg p.o. daily as needed for edema, Senokot-S 50/8.6 mg 2 tabs p.o. at bedtime, potassium 10 mEq p.o. daily while taking the Lasix, Cipro 500 mg p.o. b.i.d. which she states she is on chronically, vitamin D3 2000 international units p.o. daily.   PHYSICAL EXAMINATION:  VITAL SIGNS: Temperature 97.9; heart rate 151, currently 116; respirations 18; blood pressure 110/80; saturating 100% on supplemental oxygen. Weight 65.8 kg, BMI 25.7.  GENERAL: Well-nourished, well-developed, Caucasian female currently in no acute distress.  HEAD: Normocephalic, atraumatic.  EYES: Pupils equal, round, reactive to light. Extraocular muscles intact. No scleral icterus.  MOUTH: Moist mucosal membranes. Dentition intact. No abscess noted. EARS, NOSE, THROAT: Clear without exudates. No external lesions.  NECK: Supple. No thyromegaly. No nodules. No JVD.  PULMONARY: Clear to auscultation bilaterally without wheezes, rales, or rhonchi. No use of accessory muscles. Good respiratory effort.   CHEST:  Nontender to palpation.  CARDIOVASCULAR: S1, S2, irregular rate, irregular rhythm. Tachycardic. No murmurs, rubs, or gallops. 1+ edema to shins bilaterally. Pedal pulses 2+ bilaterally. GASTROINTESTINAL: Soft, nontender, nondistended. No masses. Positive bowel sounds. No hepatosplenomegaly.  MUSCULOSKELETAL: No swelling, clubbing, or edema. Range of motion full in all extremities.  NEUROLOGIC: Cranial nerves II through XII intact. No gross focal neurological deficits. Sensation intact. Reflexes intact.  SKIN: No ulceration, lesions, rash, cyanosis. Skin warm, dry. Turgor intact.  PSYCHIATRIC: Mood and affect within normal limits. Patient awake, alert, oriented x 3. Insight and judgment are intact.   LABORATORY DATA: EKG performed reveals atrial fibrillation with rapid ventricular response, heart rate 150s. Remainder of laboratory data: Sodium 146, potassium 3.3, chloride 110, bicarbonate of 25, BUN 12, creatinine 1.08, glucose 123. LFTs: Albumin 3.2, otherwise within normal limits. Troponin less than 0.02, WBC 4, hemoglobin 13.6, platelets of 141,000. INR of 1.4.    ASSESSMENT AND PLAN: An 81 year old Caucasian female with a history of paroxysmal atrial fibrillation presenting with palpitations, shortness of breath, atrial fibrillation with rapid ventricular response.  1.  Atrial fibrillation with rapid ventricular response. She received Cardizem 50 mg IV x 2 in the Emergency Department as well as 120 mg p.o. She was then subsequently started on Cardizem drip. Goal heart rate will be less than 120 and taken off her rate-control agent, amiodarone, back in August 2015 secondary to interaction with tamoxifen. She will likely need a more chronic agent. She is on warfarin but subtherapeutic. She has received a dose of Lovenox. As well, cardiology consult has been placed. She follows with Dr. Ubaldo Glassing on a regular basis.  2.  Hypokalemia. Replace to goal of 4 to 5.  3.  Breast cancer. Continue with  tamoxifen.  4.  Venous thromboembolism prophylaxis on warfarin; however, subtherapeutic. She received a dosage of Lovenox.  CODE STATUS: Patient is full code.   TIME SPENT: 45 minutes.   ____________________________ Aaron Mose. Tanveer Brammer, MD dkh:ST D: 08/13/2014 01:56:15 ET T: 08/13/2014 23:25:13 ET JOB#: [id number]  cc: Aaron Mose. Adriene Padula, MD, <Dictator> Brennen Gardiner Woodfin Ganja MD ELECTRONICALLY SIGNED 08/14/2014 20:31

## 2015-01-14 NOTE — Consult Note (Signed)
Reason for Visit: This 81 year old Female patient presents to the clinic for initial evaluation of  breast cancer .   Referred by Dr. Tamala Julian.  Diagnosis:  Chief Complaint/Diagnosis   a 81 year old femalewith stage I  (T1 A. N0 M0) well differentiated invasive mammary carcinoma ER/PR positive HER-2/neu not over expressed.of the right breast says is wide local excision and sentinel node biopsy.  Pathology Report pathology report reviewed   Imaging Report mammograms reviewed   Referral Report clinical notes reviewed   Planned Treatment Regimen whole breast radiation plus aromatase inhibitor   HPI   patient is an 81 year old female who presents with an abnormal mammogram of the right breast showing an 8 mm ill-defined mass confirmed on ultrasound suspicious for malignancy.patient underwent ultrasound-guided biopsy which was positive for invasive mammary carcinoma. She underwent a wide local excision for a 4 mm well-differentiated grade 1 invasive mammary carcinoma ER/PR positive HER-2/neu not over expressed. Margins were clear at 2 mm. 2 sentinel lymph nodes were negative for malignancy. Patient has done well postoperatively. 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 She is seen today for consideration of treatment. She will be a candidate for aromatase inhibitor and not a candidate for systemic chemotherapy by opinion of medical oncology. She specifically denies breast tenderness cough or bone pain.  Past Hx:    High cholesterol:    Hypertension:    irregular HB:    back surgery: Dec 2010   Rotator cuff repair  Bilateral:    R knee replacement:    Disk fusion: 2009  Past, Family and Social History:  Past Medical History positive   Cardiovascular hyperlipidemia; hypertension; U. regular rhythm   Past Surgical History rotator cuff repair  bilaterally, right knee replacement, instrumentation and back surgery with disc effusion   Past Medical History Comments spastic dysphonia, osteoporosis, hemorrhoids   Family History noncontributory   Social History noncontributory   Additional Past Medical and Surgical History seen by herself today   Allergies:   Ace Inhibitors: Cough  Codeine: N/V/Diarrhea  Peanuts: Hives  Morphine: Unknown  Beta Adrenergic agents: Unknown  Home Meds:  Home Medications: Medication Instructions Status  tramadol 50 mg oral tablet 1-2 tab(s) orally every 4 hours, As needed, pain for mild to moderate pain Active  aspirin 81 mg oral enteric coated tablet 1 tab(s) orally once a day (at bedtime) Active  warfarin 2.5 mg oral tablet 1 tab(s) orally once a day Active  warfarin 2 mg oral tablet 1 tab(s) orally once a day along with 2.29m tab Active  zolpidem 5 mg oral tablet 1 tab(s) orally once a day (at bedtime), As Needed Active  Senokot S 50 mg-8.6 mg oral tablet 2 tab(s) orally once a day (at bedtime) Active  furosemide 40 mg oral tablet 1 tab(s) orally once a day, As Needed Active  Klor-Con 10 10 mEq oral tablet, extended release 1 tab(s) orally once a day, As Needed, when taking furosemide Active  Advil PM 25 mg-200 mg oral capsule 2 cap(s) orally once a day (at bedtime) Active  Allegra 180 mg oral tablet 1 tab(s) orally once a day (in the morning) Active  Vitamin D3 2000 intl units oral tablet 1 tab(s) orally once a day (in the morning) Active   Review of Systems:  General negative   Performance Status (ECOG) 0   Skin negative  Breast see HPI   Ophthalmologic negative   ENMT negative   Respiratory and Thorax negative   Cardiovascular negative   Gastrointestinal negative   Genitourinary negative   Musculoskeletal negative   Neurological negative   Psychiatric negative   Hematology/Lymphatics negative   Endocrine negative   Allergic/Immunologic negative   Review of  Systems   review of systems obtained from nurses notes  Nursing Notes:  Nursing Vital Signs and Chemo Nursing Nursing Notes: *CC Vital Signs Flowsheet:   22-Jun-15 10:36  Temp Temperature 98  Pulse Pulse 69  Respirations Respirations 21  SBP SBP 153  DBP DBP 80  Current Weight (kg) (kg) 65.9  Height (cm) centimeters 160  BSA (m2) 1.6   Physical Exam:  General/Skin/HEENT:  General normal   Skin normal   Eyes normal   ENMT normal   Head and Neck normal   Additional PE well-developed female in NAD. Right breast is a wide local excision scar which is healing well It is close to the nipple area complex. No dominant mass or nodularity is noted in either breast in 2 positions examined. No axillary or supraclavicular adenopathy is appreciated. Lungs are clear to A&P cardiac examination shows regular rate and rhythm.   Breasts/Resp/CV/GI/GU:  Respiratory and Thorax normal   Cardiovascular normal   Gastrointestinal normal   Genitourinary normal   MS/Neuro/Psych/Lymph:  Musculoskeletal normal   Neurological normal   Lymphatics normal   Other Results:  Radiology Results: LabUnknown:    17-Apr-15 13:52, Screening Digital Mammogram  PACS Image     24-Apr-15 14:19, Digital Additional Views Rt Breast (SCR)  PACS Image   [facility]:  Digital Additional Views Rt Breast (SCR)   REASON FOR EXAM:    av rt mass  COMMENTS:       PROCEDURE: MAM - MAM DIG ADDVIEWS RT SCR  - Jan 14 2014  2:19PM     CLINICAL DATA:  Possible right breast mass    EXAM:  DIGITAL DIAGNOSTIC  RIGHT MAMMOGRAM    ULTRASOUND RIGHT BREAST    COMPARISON:  01/07/2014 and prior studies    ACR Breast Density Category b: There are scattered areas of  fibroglandular density.    FINDINGS:  There is a persistent round ill-defined hyper attenuating 8 mm mass  in the 4 o'clock position of the right breast, associated with or  adjacent to a few benign-appearing calcifications.    On physical exam,  there are no palpable abnormalities.    Ultrasound is performed, showing an irregular anti parallel  hypoechoic shadowing mass 1 cm from the nipple in the 4 o'clock  position of the right breast. There are a few calcifications  associated with the mass. It causes posterior acoustic shadowing. It  measures 5 x 5 x 7 mm. Ultrasound of the right axilla reveals normal  lymph nodes, the largest measuring 2.3 cm and all of which  demonstrate normal fatty hila.     IMPRESSION:  Suspicious right breast mass    RECOMMENDATION:  Ultrasound-guided core needle biopsy has been scheduled    I have discussed the findings and recommendations with the patient.  Results were also provided in writing at the conclusion of the  visit. If applicable, a reminder letter will be sent to the patient  regarding the next appointment.    BI-RADS CATEGORY  4: Suspicious.  Electronically Signed    By: Skipper Cliche M.D.    On: 01/14/2014 15:41  Verified By: Rachael Fee, M.D.,   Relevent Results:   Relevant Scans and Labs mammogram and ultrasound reviewed   Assessment and Plan: Impression:   stage I invasive mammary carcinoma the right breast status post wide local excision and sentinel biopsy ER/PR positive HER-2/neu negative in 81 year old female Plan:   the stomach to go ahead with hypofractionated radiation therapy to her right breast. We'll treat in 16 fractions and then posterior scar a mother and 1600 cGy based on the close margin. Risks and benefits of treatment were reviewed with the patient and she seems to comprehend my treatment plan well. Side effects such as redness of the skin, alteration blood counts, possible occlusion of superficial lung, all were explained in detail to the patient. She seems to comprehend my treatment plan well. I have set her up for CT simulation later this week. Patient will also be a candidate for aromatase inhibitor therapy after completion of  radiation.  I would like to take this opportunity for allowing me to participate in the care of your patient..  CC Referral:  cc: Dr. Tamala Julian, Dr. Emily Filbert   Electronic Signatures: Baruch Gouty, Roda Shutters (MD)  (Signed 22-Jun-15 14:07)  Authored: HPI, Diagnosis, Past Hx, PFSH, Allergies, Home Meds, ROS, Nursing Notes, Physical Exam, Other Results, Relevent Results, Encounter Assessment and Plan, CC Referring Physician   Last Updated: 22-Jun-15 14:07 by Armstead Peaks (MD)

## 2015-01-14 NOTE — Discharge Summary (Signed)
PATIENT NAME:  Kelly Bishop, Kelly Bishop MR#:  [id number] DATE OF BIRTH:  Apr 02, 1932  DATE OF ADMISSION:  08/13/2014 DATE OF DISCHARGE:  08/15/2014  DISCHARGE DIAGNOSES: 1.  Rapid atrial fibrillation, converted to normal sinus rhythm.  2.  Breast cancer.  3.  History of osteomyelitis of spine.  4.  Hypertension.  5.  Hyperlipidemia.   DISCHARGE MEDICATIONS: 1.  Aspirin 81 mg daily. 2.  Ambien 5 mg at bedtime p.r.n.  3.  Senokot daily.  4.  Lasix 40 mg daily p.r.n. 5.  Potassium 10 mEq daily p.r.n.  6.  Allegra 180 mg daily.  7.  Vitamin D3 2000 units daily.  8.  Tamoxifen 20 mg daily. 9.  Warfarin 6 mg at bedtime. 10.  Cipro 500 mg b.i.d. 11.  Diltiazem ER 120 mg daily.   REASON FOR ADMISSION: An 81 year old female who presents with rapid A-fib.  Please see H and P for HPI, past medical history, and physical exam.   HOSPITAL COURSE: The patient was admitted, placed on IV diltiazem drip. She auto converted back to normal rhythm. She is unable to take amiodarone because of her chronic Cipro with her history of osteo of the spine. She was given Cardizem CD 120 mg daily and stayed in normal sinus rhythm on ambulation for 24 hours. She will be discharged home on the above medications for follow up with Dr. Sabra Heck in 1 to 2 weeks.   ____________________________ [name], MD mfm:sb D: 08/15/2014 08:02:54 ET T: 08/15/2014 11:10:17 ET JOB#: [id number]  cc: [name], MD, <Dictator> [name] MD ELECTRONICALLY SIGNED 08/16/2014 8:25

## 2015-01-14 NOTE — Consult Note (Signed)
PATIENT NAME:  Kelly Bishop, Kelly Bishop MR#:  [id number] DATE OF BIRTH:  [birth year]  DATE OF CONSULTATION:  08/14/2014  REFERRING PHYSICIAN:  Aaron Mose. Hower, MD CONSULTING PHYSICIAN:  Corey Skains, MD  REASON FOR CONSULTATION:  1.  Paroxysmal, nonvalvular atrial fibrillation.  2.  Essential hypertension.  3.  Mixed hyperlipidemia needing further treatment options.   CHIEF COMPLAINT: "I have a rapid rate."   HISTORY OF PRESENT ILLNESS: This is an 81 year old female with known nonvalvular paroxysmal atrial fibrillation with an episode of rapid ventricular rate of her atrial fibrillation and palpitations and irregular heartbeat with shortness of breath. This increased, so she was seen in the Emergency Room for these symptoms and, therefore, was placed on a diltiazem drip. At that time, she had electrical cardioversion to normal sinus rhythm and has had no further symptoms with a normal troponin and no evidence of myocardial infarction with better blood pressure control and heart rate control. The patient does have essential hypertension, for which she is on appropriate medication and mixed hyperlipidemia at low risk for cardiovascular issues in the future. The patient has had no current symptoms since conversion to normal sinus rhythm. She has had amiodarone use in the past for this, although with chronic ciprofloxacin use due to osteomyelitis, the patient will have a higher risk of rhythm disturbances as well as interactions of amiodarone and ciprofloxacin causing long QT interval and rhythm disturbances. Therefore, would not use this medication. She has had success with diltiazem and may need a longer daily use of this.   Remainder review of systems negative for vision change, ringing in the ears, hearing loss, cough, congestion, heartburn, nausea, vomiting, diarrhea, bloody stool, stomach pain, extremity pain, leg weakness, cramping of the buttocks, known blood clots, headaches, blackouts, dizzy spells,  nosebleeds, congestion, trouble swallowing, frequent urination, urination at night, muscle weakness, numbness, anxiety, depression, skin lesions, or skin rashes.   PAST MEDICAL HISTORY:  1.  Paroxysmal nonvalvular atrial fibrillation.  2.  Essential hypertension.  3.  Mixed hyperlipidemia.   FAMILY HISTORY: No apparent family members with early onset of cardiovascular disease or hypertension.   SOCIAL HISTORY: She currently denies alcohol or tobacco use.   ALLERGIES: As listed.   MEDICATIONS: As listed.   PHYSICAL EXAMINATION:  VITAL SIGNS: Blood pressure is 110/68 bilaterally. Heart rate is 72 upright, reclining, and regular.  GENERAL: She is a well-appearing female in no acute distress.  HEENT: No icterus, thyromegaly, ulcers, hemorrhage, or xanthelasma.  CARDIOVASCULAR: Regular rate and rhythm. Normal S1 and S2, inferior PMI, but no murmurs. Carotid upstroke normal without bruit. Jugular venous pressure is normal.  LUNGS: Clear to auscultation with normal respirations.  ABDOMEN: Soft, nontender, without hepatosplenomegaly or masses. Abdominal aorta is normal size without bruit.  EXTREMITIES: 2+ radial, femoral, dorsal pedal pulses, with no lower extremity edema, cyanosis, clubbing or ulcers.  NEUROLOGIC: She is oriented to time, place, and person, with normal mood and affect.   ASSESSMENT: An 81 year old female with paroxysmal nonvalvular atrial fibrillation, essential hypertension, mixed hyperlipidemia now converted to normal sinus rhythm, needing further treatment options.   RECOMMENDATIONS:  1.  Continue diltiazem at low dose for maintenance of normal sinus rhythm and heart rate and essential hypertension control. 2.  Consider anticoagulation if the patient would tolerate for intermittent atrial fibrillation due to higher CHADS score, following for other risks of bleeding.  3.  No further cardiac diagnostics necessary at this time.  4.  Avoid amiodarone with Cipro use due to  concerns of QT interval issues and interactions.  5.  Ambulation and possible discharge to home.    ____________________________ Corey Skains, MD bjk:ah D: 08/14/2014 07:46:03 ET T: 08/14/2014 13:01:20 ET JOB#: [id number]  cc: Corey Skains, MD, <Dictator> Corey Skains MD ELECTRONICALLY SIGNED 08/17/2014 8:22

## 2015-01-14 NOTE — Op Note (Signed)
PATIENT NAME:  Kelly Bishop, Kelly Bishop MR#:  [id number] DATE OF BIRTH:  1932-06-15  DATE OF PROCEDURE:  02/17/2014  PREOPERATIVE DIAGNOSIS: Carcinoma of the right breast.   POSTOPERATIVE DIAGNOSIS: Carcinoma of the right breast.   PROCEDURE: Right partial mastectomy with axillary sentinel lymph node biopsy.   SURGEON: Rochel Brome, M.D.   ANESTHESIA: General.   INDICATIONS: This 81 year old female recently had a mammogram depicting a density in the right breast at approximately the 4-o'clock position. Ultrasound also demonstrated this density. She has had needle biopsy demonstrating invasive mammary carcinoma and surgery was recommended for definitive treatment.   DESCRIPTION OF PROCEDURE: The patient was placed on the operating table in the supine position under general anesthesia. The right arm was placed on a lateral arm support. The dressing was removed from the lower aspect of the right breast exposing the Kopans wire, which entered the breast at approximately 4-o'clock position. I could see the thick portion of the Kopans wire protruding out approximately 2 mm. The wire was cut 2 cm from the skin. The right breast, axilla and surrounding chest wall were prepared with ChloraPrep and draped in a sterile manner.   A curvilinear incision was made at made in the inferior medial aspect of the right breast from approximately 3-o'clock to 6 o'clock position and removed an ellipse of skin, which was approximately 8 mm in width in continuity with the underlying tissue. The dissection was carried down to encounter the wire. A portion of tissue surrounding the wire was excised. There was palpable firmness within the tissue, which helped to guide the dissection. The specimen was marked so that the 6-o'clock end of the skin ellipse was tagged with a nylon stitch and also margin maps were attached to the specimen to mark the medial, lateral, cranial, caudal and deep margins for the pathologist's orientation. The  specimen was submitted for specimen mammogram and pathology to inspect for margins. The wound was inspected. Several small bleeding points were cauterized. Hemostasis was subsequently intact. There was no remaining palpable mass within the wound.   Attention was turned to the inferior aspect of the right axilla. The gamma counter was used to demonstrate the location of radioactivity in the inferior aspect of the axilla. An oblique 4 cm incision was made, carried down through subcutaneous tissues, through superficial fascia and dissected down adjacent to the rib cage to encounter radioactivity. The first lymph node encountered was approximately 6 mm in dimension and was dissected free from surrounding structures using electrocautery for hemostasis. The ex vivo count was approximately 50 to 55 counts per second and submitted as sentinel lymph node #1. The gamma counter was again used to further probe the axilla demonstrating another lymph node somewhat deeper to the first adjacent to the rib cage. This deeper lymph node was approximately 1 cm in dimension and was smooth and slightly firm. It was resected with electrocautery with some surrounding fatty tissue. The ex vivo count was approximately 80 to 90 counts per second and this was submitted as sentinel lymph node #2. The background count was less than 5. There was no remaining palpable mass within the axilla.   Both wounds were inspected and hemostasis appeared to be intact. Both wounds were infiltrated with 1% Xylocaine with epinephrine and the subcutaneous tissues. The axillary wound was closed initially placing 4-0 chromic simple sutures in the subcutaneous tissues and then the skin was closed with a running 4-0 Monocryl subcuticular suture. Next, the partial mastectomy wound was further inspected. Subcutaneous  tissues were approximated with interrupted 4-0 chromic. The skin was closed with a running 4-0 Monocryl subcuticular suture.   The radiologist  called to report that the mass in the biopsy marker was seen in satisfactory position within the specimen. Later the pathologist called to report that the margins appeared clear. The closest margin was the skin margin. Next, the wounds were treated with Dermabond and allowed to dry. The patient appeared to be in satisfactory condition and was then prepared for transfer to the recovery room.   ____________________________ Lenna Sciara. Rochel Brome, MD jws:aw D: 02/17/2014 14:07:30 ET T: 02/17/2014 14:46:24 ET JOB#: [id number]  cc: Loreli Dollar, MD, <Dictator> Loreli Dollar MD ELECTRONICALLY SIGNED 02/17/2014 19:30

## 2015-02-01 ENCOUNTER — Other Ambulatory Visit: Payer: Self-pay

## 2015-02-06 ENCOUNTER — Other Ambulatory Visit: Payer: Self-pay

## 2015-02-06 DIAGNOSIS — C50911 Malignant neoplasm of unspecified site of right female breast: Secondary | ICD-10-CM

## 2015-02-07 ENCOUNTER — Inpatient Hospital Stay: Payer: PPO | Attending: Internal Medicine

## 2015-02-07 DIAGNOSIS — Z170 Estrogen receptor positive status [ER+]: Secondary | ICD-10-CM | POA: Insufficient documentation

## 2015-02-07 DIAGNOSIS — C50911 Malignant neoplasm of unspecified site of right female breast: Secondary | ICD-10-CM | POA: Insufficient documentation

## 2015-02-07 DIAGNOSIS — Z79811 Long term (current) use of aromatase inhibitors: Secondary | ICD-10-CM | POA: Diagnosis not present

## 2015-02-07 LAB — CBC WITH DIFFERENTIAL/PLATELET
Basophils Absolute: 0 10*3/uL (ref 0–0.1)
Basophils Relative: 1 %
EOS PCT: 3 %
Eosinophils Absolute: 0.1 10*3/uL (ref 0–0.7)
HCT: 38.6 % (ref 35.0–47.0)
Hemoglobin: 12.6 g/dL (ref 12.0–16.0)
LYMPHS PCT: 26 %
Lymphs Abs: 0.7 10*3/uL — ABNORMAL LOW (ref 1.0–3.6)
MCH: 29.7 pg (ref 26.0–34.0)
MCHC: 32.6 g/dL (ref 32.0–36.0)
MCV: 91 fL (ref 80.0–100.0)
Monocytes Absolute: 0.3 10*3/uL (ref 0.2–0.9)
Monocytes Relative: 9 %
NEUTROS ABS: 1.7 10*3/uL (ref 1.4–6.5)
NEUTROS PCT: 61 %
Platelets: 132 10*3/uL — ABNORMAL LOW (ref 150–440)
RBC: 4.25 MIL/uL (ref 3.80–5.20)
RDW: 13.8 % (ref 11.5–14.5)
WBC: 2.8 10*3/uL — AB (ref 3.6–11.0)

## 2015-02-07 LAB — IRON AND TIBC
Iron: 73 ug/dL (ref 28–170)
Saturation Ratios: 22 % (ref 10.4–31.8)
TIBC: 334 ug/dL (ref 250–450)
UIBC: 261 ug/dL

## 2015-02-07 LAB — FOLATE: FOLATE: 10.7 ng/mL (ref >5.9)

## 2015-02-07 LAB — VITAMIN B12: VITAMIN B 12: 604 pg/mL (ref 180–914)

## 2015-02-08 LAB — INTRINSIC FACTOR ANTIBODIES: Intrinsic Factor: 0.9 AU/mL (ref 0.0–1.1)

## 2015-05-22 ENCOUNTER — Ambulatory Visit: Payer: PPO | Attending: Radiation Oncology | Admitting: Radiation Oncology

## 2015-07-11 ENCOUNTER — Encounter: Payer: Self-pay | Admitting: *Deleted

## 2015-07-11 ENCOUNTER — Other Ambulatory Visit: Payer: Self-pay | Admitting: *Deleted

## 2015-07-11 DIAGNOSIS — C50911 Malignant neoplasm of unspecified site of right female breast: Secondary | ICD-10-CM

## 2015-07-12 ENCOUNTER — Inpatient Hospital Stay: Payer: PPO

## 2015-07-12 ENCOUNTER — Encounter: Payer: Self-pay | Admitting: Internal Medicine

## 2015-07-12 ENCOUNTER — Inpatient Hospital Stay: Payer: PPO | Attending: Internal Medicine | Admitting: Internal Medicine

## 2015-07-12 DIAGNOSIS — C50311 Malignant neoplasm of lower-inner quadrant of right female breast: Secondary | ICD-10-CM

## 2015-07-12 DIAGNOSIS — Z79899 Other long term (current) drug therapy: Secondary | ICD-10-CM

## 2015-07-12 DIAGNOSIS — M810 Age-related osteoporosis without current pathological fracture: Secondary | ICD-10-CM

## 2015-07-12 DIAGNOSIS — I4891 Unspecified atrial fibrillation: Secondary | ICD-10-CM

## 2015-07-12 DIAGNOSIS — Z808 Family history of malignant neoplasm of other organs or systems: Secondary | ICD-10-CM | POA: Insufficient documentation

## 2015-07-12 DIAGNOSIS — Z806 Family history of leukemia: Secondary | ICD-10-CM | POA: Diagnosis not present

## 2015-07-12 DIAGNOSIS — Z923 Personal history of irradiation: Secondary | ICD-10-CM | POA: Diagnosis not present

## 2015-07-12 DIAGNOSIS — Z803 Family history of malignant neoplasm of breast: Secondary | ICD-10-CM | POA: Insufficient documentation

## 2015-07-12 DIAGNOSIS — C50911 Malignant neoplasm of unspecified site of right female breast: Secondary | ICD-10-CM | POA: Diagnosis not present

## 2015-07-12 DIAGNOSIS — Z7982 Long term (current) use of aspirin: Secondary | ICD-10-CM

## 2015-07-12 DIAGNOSIS — Z79810 Long term (current) use of selective estrogen receptor modulators (SERMs): Secondary | ICD-10-CM | POA: Diagnosis not present

## 2015-07-12 DIAGNOSIS — M25552 Pain in left hip: Secondary | ICD-10-CM | POA: Diagnosis not present

## 2015-07-12 DIAGNOSIS — M1990 Unspecified osteoarthritis, unspecified site: Secondary | ICD-10-CM

## 2015-07-12 DIAGNOSIS — Z801 Family history of malignant neoplasm of trachea, bronchus and lung: Secondary | ICD-10-CM | POA: Diagnosis not present

## 2015-07-12 DIAGNOSIS — Z7901 Long term (current) use of anticoagulants: Secondary | ICD-10-CM | POA: Diagnosis not present

## 2015-07-12 DIAGNOSIS — Z85828 Personal history of other malignant neoplasm of skin: Secondary | ICD-10-CM | POA: Diagnosis not present

## 2015-07-12 DIAGNOSIS — Z170 Estrogen receptor positive status [ER+]: Secondary | ICD-10-CM

## 2015-07-12 LAB — CBC WITH DIFFERENTIAL/PLATELET
BASOS ABS: 0 10*3/uL (ref 0–0.1)
BASOS PCT: 1 %
EOS PCT: 1 %
Eosinophils Absolute: 0.1 10*3/uL (ref 0–0.7)
HEMATOCRIT: 37.9 % (ref 35.0–47.0)
Hemoglobin: 12.8 g/dL (ref 12.0–16.0)
Lymphocytes Relative: 20 %
Lymphs Abs: 0.7 10*3/uL — ABNORMAL LOW (ref 1.0–3.6)
MCH: 30.4 pg (ref 26.0–34.0)
MCHC: 33.7 g/dL (ref 32.0–36.0)
MCV: 90.4 fL (ref 80.0–100.0)
MONO ABS: 0.3 10*3/uL (ref 0.2–0.9)
MONOS PCT: 8 %
Neutro Abs: 2.5 10*3/uL (ref 1.4–6.5)
Neutrophils Relative %: 70 %
PLATELETS: 153 10*3/uL (ref 150–440)
RBC: 4.19 MIL/uL (ref 3.80–5.20)
RDW: 13.3 % (ref 11.5–14.5)
WBC: 3.6 10*3/uL (ref 3.6–11.0)

## 2015-07-12 LAB — HEPATIC FUNCTION PANEL
ALT: 18 U/L (ref 14–54)
AST: 29 U/L (ref 15–41)
Albumin: 3.5 g/dL (ref 3.5–5.0)
Alkaline Phosphatase: 36 U/L — ABNORMAL LOW (ref 38–126)
BILIRUBIN DIRECT: 0.2 mg/dL (ref 0.1–0.5)
BILIRUBIN INDIRECT: 0.5 mg/dL (ref 0.3–0.9)
BILIRUBIN TOTAL: 0.7 mg/dL (ref 0.3–1.2)
Total Protein: 6.3 g/dL — ABNORMAL LOW (ref 6.5–8.1)

## 2015-07-12 LAB — CREATININE, SERUM
Creatinine, Ser: 1.16 mg/dL — ABNORMAL HIGH (ref 0.44–1.00)
GFR calc non Af Amer: 43 mL/min — ABNORMAL LOW (ref >60)
GFR, EST AFRICAN AMERICAN: 50 mL/min — AB (ref >60)

## 2015-07-12 NOTE — Progress Notes (Signed)
Pt f/u for breast cancer. Does her own self breast exam and has calcifications that come and go.  Sometimes she has pain in her surgery site and was sent back to sugeon because of it and sometimes she feels the breast is swollen and then it goes away. She states for about 2 weeks and she did not fall and hurt herself. Has appt with PA at orthopedics next week.  assted md with breast exam.

## 2015-07-12 NOTE — Progress Notes (Signed)
[place] OFFICE PROGRESS NOTE  Patient Care Team: No Pcp Per Patient as PCP - General (General Practice)   SUMMARY OF ONCOLOGIC HISTORY:  # 2015- RIGHT BREAST CA STAGE I [T1aN0]ER/PR- POS; Her-2 NEU-NEG; s/p Lumpec & RT [Dr.Crystal] on tamoxifen [given osteoporosis]  # A.fib on Coumadin;   # Osteoporosis   INTERVAL HISTORY:  A very pleasant 81 year old female patient with above history of stage I breast cancer of the right side currently on adjuvant tamoxifen/given history of osteoporosis is here for follow-up.  Patient denies any unusual chest pain or shortness of breath or cough. She has worsening left hip pain; awaiting to see orthopedics the next few days. She has intermittent discomfort in the right breast- swelling/stinging sensation since she had the breast surgery/radiation. This is not getting any worse. This is intermittent. Her last mammogram in April 2016 within normal limits.  REVIEW OF SYSTEMS:  A complete 10 point review of system is done which is negative except mentioned above/history of present illness.   PAST MEDICAL HISTORY :  Past Medical History  Diagnosis Date  . Breast cancer, right breast ([name])     right breast Invasive mammary carcinoma grade 1  . Lymphopenia   . Anemia   . Thrombocytopenia ([place])   . B12 deficiency   . Osteoporosis July 2015    seen on DEXA scan-T score of -2.9 in the left neck femur  . Atrial fibrillation ([place])   . Osteoarthritis   . Spastic dysphonia   . Zenker's diverticulum   . Cataracts, bilateral   . Hemorrhoids   . Skin cancer   . Pancytopenia ([place])   . Breast cancer ([place])     PAST SURGICAL HISTORY :   Past Surgical History  Procedure Laterality Date  . L1-l4 laminectomy and t6 fusion  May 4742    complicated by postoperative infection, more recently had recurrent pseudomonas infection at scar site March 2015  . Ultrasound guided biopsy of breast  Jan 27, 2014    FAMILY HISTORY :   Family History   Problem Relation Age of Onset  . Throat cancer Mother   . Prostate cancer Brother   . Breast cancer Brother   . Leukemia Brother   . Lung cancer Father     SOCIAL HISTORY:   Social History  Substance Use Topics  . Smoking status: Never Smoker   . Smokeless tobacco: Never Used  . Alcohol Use: No    ALLERGIES:  is allergic to ace inhibitors; beta adrenergic blockers; morphine and related; peanut-containing drug products; and codeine.  MEDICATIONS:  Current Outpatient Prescriptions  Medication Sig Dispense Refill  . ALPRAZolam (XANAX) 0.5 MG tablet once daily as needed.     Marland Kitchen amoxicillin (AMOXIL) 500 MG capsule Take by mouth.    . bisacodyl (DULCOLAX) 5 MG EC tablet Take 5 mg by mouth at bedtime. 2 tablets at night    . diltiazem (CARDIZEM) 30 MG tablet Take 30 mg by mouth daily.    . furosemide (LASIX) 20 MG tablet Take by mouth.    . warfarin (COUMADIN) 5 MG tablet Take by mouth.    . warfarin (COUMADIN) 6 MG tablet Take by mouth.    Marland Kitchen aspirin 81 MG chewable tablet Chew by mouth.    . Cholecalciferol (VITAMIN D-1000 MAX ST) 1000 UNITS tablet Take by mouth.    . ciprofloxacin (CIPRO) 500 MG tablet Take by mouth.    . Cyanocobalamin (RA VITAMIN B-12 TR) 1000 MCG TBCR  Take by mouth.    . loratadine (CLARITIN) 10 MG tablet Take by mouth.    . potassium chloride (K-DUR) 10 MEQ tablet Take by mouth.    . tamoxifen (NOLVADEX) 20 MG tablet Take by mouth.    . zolpidem (AMBIEN) 5 MG tablet Take by mouth.     No current facility-administered medications for this visit.    PHYSICAL EXAMINATION: ECOG PERFORMANCE STATUS: 0 - Asymptomatic  There were no vitals taken for this visit.  There were no vitals filed for this visit.  GENERAL: Well-nourished well-developed; Alert, no distress and comfortable.  She is alone. She needs help getting on the exam table. EYES: no pallor or icterus OROPHARYNX: no thrush or ulceration; good dentition  NECK: supple, no masses felt LYMPH:  no  palpable lymphadenopathy in the cervical, axillary or inguinal regions LUNGS: clear to auscultation and  No wheeze or crackles HEART/CVS: regular rate & rhythm and no murmurs; No lower extremity edema ABDOMEN:abdomen soft, non-tender and normal bowel sounds Musculoskeletal:no cyanosis of digits and no clubbing  PSYCH: alert & oriented x 3 with fluent speech NEURO: no focal motor/sensory deficits SKIN:  no rashes or significant lesions Right breast- lumpectomy scar noted; telangiectasias noted from radiation. Otherwise no skin changes noted. Left breast- normal limits.   LABORATORY DATA:  I have reviewed the data as listed    Component Value Date/Time   NA 147* 08/14/2014 0416   K 3.9 08/14/2014 0416   CL 114* 08/14/2014 0416   CO2 25 08/14/2014 0416   GLUCOSE 91 08/14/2014 0416   BUN 9 08/14/2014 0416   CREATININE 1.16* 07/12/2015 1401   CREATININE 0.90 08/14/2014 0416   CALCIUM 8.0* 08/14/2014 0416   PROT 6.3* 07/12/2015 1401   PROT 6.9 08/13/2014 2058   ALBUMIN 3.5 07/12/2015 1401   ALBUMIN 3.2* 08/13/2014 2058   AST 29 07/12/2015 1401   AST 32 08/13/2014 2058   ALT 18 07/12/2015 1401   ALT 22 08/13/2014 2058   ALKPHOS 36* 07/12/2015 1401   ALKPHOS 50 08/13/2014 2058   BILITOT 0.7 07/12/2015 1401   BILITOT 0.4 08/13/2014 2058   GFRNONAA 43* 07/12/2015 1401   GFRNONAA >60 08/14/2014 0416   GFRNONAA 42* 05/03/2014 1105   GFRAA 50* 07/12/2015 1401   GFRAA >60 08/14/2014 0416   GFRAA 49* 05/03/2014 1105    No results found for: SPEP, UPEP  Lab Results  Component Value Date   WBC 3.6 07/12/2015   NEUTROABS 2.5 07/12/2015   HGB 12.8 07/12/2015   HCT 37.9 07/12/2015   MCV 90.4 07/12/2015   PLT 153 07/12/2015      Chemistry      Component Value Date/Time   NA 147* 08/14/2014 0416   K 3.9 08/14/2014 0416   CL 114* 08/14/2014 0416   CO2 25 08/14/2014 0416   BUN 9 08/14/2014 0416   CREATININE 1.16* 07/12/2015 1401   CREATININE 0.90 08/14/2014 0416       Component Value Date/Time   CALCIUM 8.0* 08/14/2014 0416   ALKPHOS 36* 07/12/2015 1401   ALKPHOS 50 08/13/2014 2058   AST 29 07/12/2015 1401   AST 32 08/13/2014 2058   ALT 18 07/12/2015 1401   ALT 22 08/13/2014 2058   BILITOT 0.7 07/12/2015 1401   BILITOT 0.4 08/13/2014 2058       RADIOGRAPHIC STUDIES: I have personally reviewed the radiological images as listed and agreed with the findings in the report. No results found.   ASSESSMENT & PLAN:   #  Right-sided breast cancer stage I ER/PR positive HER-2/neu negative. Patient is currently on adjuvant tamoxifen [given history of osteoporosis]. Tolerating tamoxifen fairly well. Clinically no evidence of recurrence noted.  # A. fib on Coumadin; interaction with tamoxifen with amiodarone. Patient is currently off amiodarone. Patient was educated that if she has issues with current antiarrhythmics/diltiazem; we'll could try putting her aromatase inhibitor. For now defer management for A. fib to cardiology.   # History of osteoporosis; hence currently on tamoxifen not AI. We will contact PCPs office regarding starting patient on oral bisphosphonate; if not responding; then Reclast could be offered.  Patient was asked to call if she had any concerns in the interim.  All questions were answered. The patient knows to call the clinic with any problems, questions or concerns. No barriers to learning was detected.  I spent 15 minutes counseling the patient face to face. The total time spent in the appointment was 30 minutes and more than 50% was on counseling and review of test results     Cammie Sickle, MD 07/12/2015 3:03 PM

## 2015-10-05 DIAGNOSIS — I2510 Atherosclerotic heart disease of native coronary artery without angina pectoris: Secondary | ICD-10-CM | POA: Diagnosis not present

## 2015-10-05 DIAGNOSIS — E785 Hyperlipidemia, unspecified: Secondary | ICD-10-CM | POA: Diagnosis not present

## 2015-10-05 DIAGNOSIS — R079 Chest pain, unspecified: Secondary | ICD-10-CM | POA: Diagnosis not present

## 2015-10-05 DIAGNOSIS — I25119 Atherosclerotic heart disease of native coronary artery with unspecified angina pectoris: Secondary | ICD-10-CM | POA: Diagnosis not present

## 2015-10-05 DIAGNOSIS — Z9861 Coronary angioplasty status: Secondary | ICD-10-CM | POA: Diagnosis not present

## 2015-10-05 DIAGNOSIS — I509 Heart failure, unspecified: Secondary | ICD-10-CM | POA: Diagnosis not present

## 2015-10-05 DIAGNOSIS — I5022 Chronic systolic (congestive) heart failure: Secondary | ICD-10-CM | POA: Diagnosis not present

## 2015-10-05 DIAGNOSIS — I719 Aortic aneurysm of unspecified site, without rupture: Secondary | ICD-10-CM | POA: Diagnosis not present

## 2015-10-05 DIAGNOSIS — I700 Atherosclerosis of aorta: Secondary | ICD-10-CM | POA: Diagnosis not present

## 2015-10-09 DIAGNOSIS — I509 Heart failure, unspecified: Secondary | ICD-10-CM | POA: Diagnosis not present

## 2015-10-09 DIAGNOSIS — E119 Type 2 diabetes mellitus without complications: Secondary | ICD-10-CM | POA: Diagnosis not present

## 2015-10-09 DIAGNOSIS — I2510 Atherosclerotic heart disease of native coronary artery without angina pectoris: Secondary | ICD-10-CM | POA: Diagnosis not present

## 2015-10-09 DIAGNOSIS — N183 Chronic kidney disease, stage 3 (moderate): Secondary | ICD-10-CM | POA: Diagnosis not present

## 2015-10-09 DIAGNOSIS — R0789 Other chest pain: Secondary | ICD-10-CM | POA: Diagnosis not present

## 2015-10-09 DIAGNOSIS — I129 Hypertensive chronic kidney disease with stage 1 through stage 4 chronic kidney disease, or unspecified chronic kidney disease: Secondary | ICD-10-CM | POA: Diagnosis not present

## 2015-10-09 DIAGNOSIS — J449 Chronic obstructive pulmonary disease, unspecified: Secondary | ICD-10-CM | POA: Diagnosis not present

## 2015-10-09 DIAGNOSIS — R079 Chest pain, unspecified: Secondary | ICD-10-CM | POA: Diagnosis not present

## 2015-10-10 DIAGNOSIS — I482 Chronic atrial fibrillation: Secondary | ICD-10-CM | POA: Diagnosis not present

## 2015-10-11 DIAGNOSIS — Z85828 Personal history of other malignant neoplasm of skin: Secondary | ICD-10-CM | POA: Diagnosis not present

## 2015-10-11 DIAGNOSIS — L820 Inflamed seborrheic keratosis: Secondary | ICD-10-CM | POA: Diagnosis not present

## 2015-10-11 DIAGNOSIS — D485 Neoplasm of uncertain behavior of skin: Secondary | ICD-10-CM | POA: Diagnosis not present

## 2015-10-11 DIAGNOSIS — L578 Other skin changes due to chronic exposure to nonionizing radiation: Secondary | ICD-10-CM | POA: Diagnosis not present

## 2015-10-11 DIAGNOSIS — C44619 Basal cell carcinoma of skin of left upper limb, including shoulder: Secondary | ICD-10-CM | POA: Diagnosis not present

## 2015-11-06 DIAGNOSIS — C44619 Basal cell carcinoma of skin of left upper limb, including shoulder: Secondary | ICD-10-CM | POA: Diagnosis not present

## 2015-11-09 DIAGNOSIS — Z79899 Other long term (current) drug therapy: Secondary | ICD-10-CM | POA: Diagnosis not present

## 2015-11-09 DIAGNOSIS — E782 Mixed hyperlipidemia: Secondary | ICD-10-CM | POA: Diagnosis not present

## 2015-11-09 DIAGNOSIS — E538 Deficiency of other specified B group vitamins: Secondary | ICD-10-CM | POA: Diagnosis not present

## 2015-11-09 DIAGNOSIS — I482 Chronic atrial fibrillation: Secondary | ICD-10-CM | POA: Diagnosis not present

## 2015-11-16 ENCOUNTER — Other Ambulatory Visit: Payer: Self-pay | Admitting: Internal Medicine

## 2015-11-16 DIAGNOSIS — G453 Amaurosis fugax: Secondary | ICD-10-CM | POA: Diagnosis not present

## 2015-11-16 DIAGNOSIS — E538 Deficiency of other specified B group vitamins: Secondary | ICD-10-CM | POA: Diagnosis not present

## 2015-11-16 DIAGNOSIS — M810 Age-related osteoporosis without current pathological fracture: Secondary | ICD-10-CM | POA: Diagnosis not present

## 2015-11-16 DIAGNOSIS — I482 Chronic atrial fibrillation: Secondary | ICD-10-CM | POA: Diagnosis not present

## 2015-11-17 ENCOUNTER — Other Ambulatory Visit: Payer: Self-pay | Admitting: Internal Medicine

## 2015-11-20 ENCOUNTER — Other Ambulatory Visit: Payer: Self-pay | Admitting: Internal Medicine

## 2015-11-20 DIAGNOSIS — Z853 Personal history of malignant neoplasm of breast: Secondary | ICD-10-CM

## 2015-11-21 ENCOUNTER — Ambulatory Visit
Admission: RE | Admit: 2015-11-21 | Discharge: 2015-11-21 | Disposition: A | Discharge status: Home / Self Care | Payer: PPO | Source: Ambulatory Visit | Attending: Internal Medicine | Admitting: Internal Medicine

## 2015-11-21 DIAGNOSIS — G453 Amaurosis fugax: Secondary | ICD-10-CM | POA: Diagnosis not present

## 2015-11-21 DIAGNOSIS — I2510 Atherosclerotic heart disease of native coronary artery without angina pectoris: Secondary | ICD-10-CM | POA: Insufficient documentation

## 2015-11-21 DIAGNOSIS — I6523 Occlusion and stenosis of bilateral carotid arteries: Secondary | ICD-10-CM | POA: Diagnosis not present

## 2015-11-23 DIAGNOSIS — I4891 Unspecified atrial fibrillation: Secondary | ICD-10-CM | POA: Diagnosis not present

## 2015-11-23 DIAGNOSIS — I10 Essential (primary) hypertension: Secondary | ICD-10-CM | POA: Diagnosis not present

## 2015-11-23 DIAGNOSIS — E782 Mixed hyperlipidemia: Secondary | ICD-10-CM | POA: Diagnosis not present

## 2015-12-13 DIAGNOSIS — Z85828 Personal history of other malignant neoplasm of skin: Secondary | ICD-10-CM | POA: Diagnosis not present

## 2015-12-13 DIAGNOSIS — L821 Other seborrheic keratosis: Secondary | ICD-10-CM | POA: Diagnosis not present

## 2015-12-13 DIAGNOSIS — L578 Other skin changes due to chronic exposure to nonionizing radiation: Secondary | ICD-10-CM | POA: Diagnosis not present

## 2015-12-13 DIAGNOSIS — L820 Inflamed seborrheic keratosis: Secondary | ICD-10-CM | POA: Diagnosis not present

## 2015-12-26 DIAGNOSIS — Z961 Presence of intraocular lens: Secondary | ICD-10-CM | POA: Diagnosis not present

## 2016-01-10 ENCOUNTER — Inpatient Hospital Stay: Payer: PPO | Attending: Internal Medicine | Admitting: Internal Medicine

## 2016-01-10 ENCOUNTER — Encounter: Payer: Self-pay | Admitting: Internal Medicine

## 2016-01-10 VITALS — BP 182/82 | HR 58 | Temp 97.5°F | Resp 18 | Ht 63.0 in | Wt 144.8 lb

## 2016-01-10 DIAGNOSIS — Z79810 Long term (current) use of selective estrogen receptor modulators (SERMs): Secondary | ICD-10-CM | POA: Diagnosis not present

## 2016-01-10 DIAGNOSIS — G8928 Other chronic postprocedural pain: Secondary | ICD-10-CM | POA: Diagnosis not present

## 2016-01-10 DIAGNOSIS — Z923 Personal history of irradiation: Secondary | ICD-10-CM | POA: Insufficient documentation

## 2016-01-10 DIAGNOSIS — N63 Unspecified lump in breast: Secondary | ICD-10-CM | POA: Diagnosis not present

## 2016-01-10 DIAGNOSIS — Z170 Estrogen receptor positive status [ER+]: Secondary | ICD-10-CM | POA: Diagnosis not present

## 2016-01-10 DIAGNOSIS — Z79899 Other long term (current) drug therapy: Secondary | ICD-10-CM | POA: Insufficient documentation

## 2016-01-10 DIAGNOSIS — M1990 Unspecified osteoarthritis, unspecified site: Secondary | ICD-10-CM | POA: Diagnosis not present

## 2016-01-10 DIAGNOSIS — M25552 Pain in left hip: Secondary | ICD-10-CM | POA: Diagnosis not present

## 2016-01-10 DIAGNOSIS — N644 Mastodynia: Secondary | ICD-10-CM | POA: Diagnosis not present

## 2016-01-10 DIAGNOSIS — G8929 Other chronic pain: Secondary | ICD-10-CM | POA: Insufficient documentation

## 2016-01-10 DIAGNOSIS — Z7982 Long term (current) use of aspirin: Secondary | ICD-10-CM | POA: Diagnosis not present

## 2016-01-10 DIAGNOSIS — M810 Age-related osteoporosis without current pathological fracture: Secondary | ICD-10-CM | POA: Insufficient documentation

## 2016-01-10 DIAGNOSIS — C50911 Malignant neoplasm of unspecified site of right female breast: Secondary | ICD-10-CM | POA: Insufficient documentation

## 2016-01-10 DIAGNOSIS — Z85828 Personal history of other malignant neoplasm of skin: Secondary | ICD-10-CM | POA: Insufficient documentation

## 2016-01-10 DIAGNOSIS — C50011 Malignant neoplasm of nipple and areola, right female breast: Secondary | ICD-10-CM

## 2016-01-10 DIAGNOSIS — I4891 Unspecified atrial fibrillation: Secondary | ICD-10-CM | POA: Insufficient documentation

## 2016-01-10 DIAGNOSIS — Z7901 Long term (current) use of anticoagulants: Secondary | ICD-10-CM | POA: Diagnosis not present

## 2016-01-10 NOTE — Progress Notes (Signed)
[facility] OFFICE PROGRESS NOTE  Patient Care Team: [name], MD as PCP - General (Internal Medicine)   SUMMARY OF ONCOLOGIC HISTORY:  # 2015- RIGHT BREAST CA STAGE I [T1aN0]ER/PR- POS; Her-2 NEU-NEG; s/p Lumpec & RT [Dr.Crystal] on tamoxifen [given osteoporosis]  # A.fib on Coumadin;   # Osteoporosis   INTERVAL HISTORY:  A very pleasant 82 year old female patient with above history of stage I breast cancer of the right side currently on adjuvant tamoxifen/given history of osteoporosis is here for follow-up.  Patient noted to have a small lump in the left breast 3:00 position she notices this morning. She is due for mammogram tomorrow. Otherwise denies any lumps or bumps anywhere else.   She has intermittent discomfort in the right breast- swelling/stinging sensation since she had the breast surgery/radiation. This is not getting any worse. She has chronic left hip pain not any better or worse.  REVIEW OF SYSTEMS:  A complete 10 point review of system is done which is negative except mentioned above/history of present illness.   PAST MEDICAL HISTORY :  Past Medical History  Diagnosis Date  . Breast cancer, right breast ([name])     right breast Invasive mammary carcinoma grade 1  . Lymphopenia   . Anemia   . Thrombocytopenia ([place])   . B12 deficiency   . Osteoporosis July 2015    seen on DEXA scan-T score of -2.9 in the left neck femur  . Atrial fibrillation ([place])   . Osteoarthritis   . Spastic dysphonia   . Zenker's diverticulum   . Cataracts, bilateral   . Hemorrhoids   . Skin cancer   . Pancytopenia ([place])   . Breast cancer ([place])     PAST SURGICAL HISTORY :   Past Surgical History  Procedure Laterality Date  . L1-l4 laminectomy and t6 fusion  May 5883    complicated by postoperative infection, more recently had recurrent pseudomonas infection at scar site March 2015  . Ultrasound guided biopsy of breast  Jan 27, 2014    FAMILY HISTORY :    Family History  Problem Relation Age of Onset  . Throat cancer Mother   . Prostate cancer Brother   . Breast cancer Brother   . Leukemia Brother   . Lung cancer Father     SOCIAL HISTORY:   Social History  Substance Use Topics  . Smoking status: Never Smoker   . Smokeless tobacco: Never Used  . Alcohol Use: No    ALLERGIES:  is allergic to ace inhibitors; beta adrenergic blockers; morphine and related; peanut-containing drug products; and codeine.  MEDICATIONS:  Current Outpatient Prescriptions  Medication Sig Dispense Refill  . ALPRAZolam (XANAX) 0.5 MG tablet once daily as needed.     Marland Kitchen amoxicillin (AMOXIL) 500 MG capsule Take by mouth. Reported on 01/10/2016    . aspirin 81 MG chewable tablet Chew by mouth.    . bisacodyl (DULCOLAX) 5 MG EC tablet Take 5 mg by mouth at bedtime. 2 tablets at night    . Cholecalciferol (VITAMIN D-1000 MAX ST) 1000 UNITS tablet Take by mouth.    . ciprofloxacin (CIPRO) 500 MG tablet Take by mouth.    . Cyanocobalamin (RA VITAMIN B-12 TR) 1000 MCG TBCR Take by mouth.    . diltiazem (CARDIZEM) 30 MG tablet Take 30 mg by mouth daily.    Marland Kitchen diltiazem (TAZTIA XT) 120 MG 24 hr capsule Take 1 tablet by mouth daily.    . furosemide (LASIX)  20 MG tablet Take by mouth.    . potassium chloride (K-DUR) 10 MEQ tablet Take by mouth.    . tamoxifen (NOLVADEX) 20 MG tablet Take by mouth.    . warfarin (COUMADIN) 5 MG tablet Take by mouth.    . warfarin (COUMADIN) 6 MG tablet Take by mouth.    . loratadine (CLARITIN) 10 MG tablet Take by mouth. Reported on 01/10/2016    . zolpidem (AMBIEN) 5 MG tablet Take by mouth. Reported on 01/10/2016     No current facility-administered medications for this visit.    PHYSICAL EXAMINATION: ECOG PERFORMANCE STATUS: 0 - Asymptomatic  BP 182/82 mmHg  Pulse 58  Temp(Src) 97.5 F (36.4 C) (Tympanic)  Resp 18  Ht _0  (1.6 m)  Wt 144 lb 13.5 oz (65.7 kg)  BMI 25.66 kg/m2  SpO2 98%  Filed Weights   01/10/16 1531   Weight: 144 lb 13.5 oz (65.7 kg)    GENERAL: Well-nourished well-developed; Alert, no distress and comfortable.  She is alone. She needs help getting on the exam table.She walks with a cane. EYES: no pallor or icterus OROPHARYNX: no thrush or ulceration; good dentition  NECK: supple, no masses felt LYMPH:  no palpable lymphadenopathy in the cervical, axillary or inguinal regions LUNGS: clear to auscultation and  No wheeze or crackles HEART/CVS: regular rate & rhythm and no murmurs; No lower extremity edema ABDOMEN:abdomen soft, non-tender and normal bowel sounds Musculoskeletal:no cyanosis of digits and no clubbing  PSYCH: alert & oriented x 3 with fluent speech NEURO: no focal motor/sensory deficits SKIN:  Multiple ecchymosis from Coumadin. Right breast- lumpectomy scar noted; telangiectasias noted from radiation. Otherwise no skin changes noted. Left breast-cystic question 1 cm cyst in the 3:00 position. normal limits.   LABORATORY DATA:  I have reviewed the data as listed    Component Value Date/Time   NA 147* 08/14/2014 0416   K 3.9 08/14/2014 0416   CL 114* 08/14/2014 0416   CO2 25 08/14/2014 0416   GLUCOSE 91 08/14/2014 0416   BUN 9 08/14/2014 0416   CREATININE 1.16* 07/12/2015 1401   CREATININE 0.90 08/14/2014 0416   CALCIUM 8.0* 08/14/2014 0416   PROT 6.3* 07/12/2015 1401   PROT 6.9 08/13/2014 2058   ALBUMIN 3.5 07/12/2015 1401   ALBUMIN 3.2* 08/13/2014 2058   AST 29 07/12/2015 1401   AST 32 08/13/2014 2058   ALT 18 07/12/2015 1401   ALT 22 08/13/2014 2058   ALKPHOS 36* 07/12/2015 1401   ALKPHOS 50 08/13/2014 2058   BILITOT 0.7 07/12/2015 1401   BILITOT 0.4 08/13/2014 2058   GFRNONAA 43* 07/12/2015 1401   GFRNONAA >60 08/14/2014 0416   GFRNONAA 42* 05/03/2014 1105   GFRAA 50* 07/12/2015 1401   GFRAA >60 08/14/2014 0416   GFRAA 49* 05/03/2014 1105    No results found for: SPEP, UPEP  Lab Results  Component Value Date   WBC 3.6 07/12/2015   NEUTROABS  2.5 07/12/2015   HGB 12.8 07/12/2015   HCT 37.9 07/12/2015   MCV 90.4 07/12/2015   PLT 153 07/12/2015      Chemistry      Component Value Date/Time   NA 147* 08/14/2014 0416   K 3.9 08/14/2014 0416   CL 114* 08/14/2014 0416   CO2 25 08/14/2014 0416   BUN 9 08/14/2014 0416   CREATININE 1.16* 07/12/2015 1401   CREATININE 0.90 08/14/2014 0416      Component Value Date/Time   CALCIUM 8.0* 08/14/2014 0416  ALKPHOS 36* 07/12/2015 1401   ALKPHOS 50 08/13/2014 2058   AST 29 07/12/2015 1401   AST 32 08/13/2014 2058   ALT 18 07/12/2015 1401   ALT 22 08/13/2014 2058   BILITOT 0.7 07/12/2015 1401   BILITOT 0.4 08/13/2014 2058         ASSESSMENT & PLAN:   # Right-sided breast cancer stage I ER/PR positive HER-2/neu negative. Patient is currently on adjuvant tamoxifen [given history of osteoporosis]. Tolerating tamoxifen fairly well. Clinically no evidence of recurrence noted.  # Left breast- 1 cm 3:00 position likely cyst. Patient is awaiting a mammogram tomorrow. She'll inform us if mammogram is abnormal.  # History of osteoporosis; hence currently on tamoxifen not AI.      Cammie Sickle, MD 01/10/2016 3:52 PM

## 2016-01-11 ENCOUNTER — Telehealth: Payer: Self-pay | Admitting: *Deleted

## 2016-01-11 ENCOUNTER — Ambulatory Visit
Admission: RE | Admit: 2016-01-11 | Discharge: 2016-01-11 | Disposition: A | Discharge status: Home / Self Care | Payer: PPO | Source: Ambulatory Visit | Attending: Internal Medicine | Admitting: Internal Medicine

## 2016-01-11 DIAGNOSIS — Z853 Personal history of malignant neoplasm of breast: Secondary | ICD-10-CM

## 2016-01-11 DIAGNOSIS — N63 Unspecified lump in breast: Secondary | ICD-10-CM | POA: Diagnosis not present

## 2016-01-11 NOTE — Telephone Encounter (Signed)
Patient had mammogram today. Bi-RADs 4. Test ordered under Dr. Emily Filbert. [facility] will reach back to Dr. Sabra Heck to request an order for biopsy.  [place] inquiring whether Dr. Rogue Bussing would be willing to order biopsy if order from Dr. Sabra Heck can not be obtain in sufficient time. I spoke with Dr. Rogue Bussing. Confirmed with [place] that Dr. Rogue Bussing that this will be fine with Dr. Rogue Bussing.

## 2016-01-12 ENCOUNTER — Other Ambulatory Visit: Payer: Self-pay | Admitting: Internal Medicine

## 2016-01-12 DIAGNOSIS — R921 Mammographic calcification found on diagnostic imaging of breast: Secondary | ICD-10-CM

## 2016-01-16 DIAGNOSIS — I482 Chronic atrial fibrillation: Secondary | ICD-10-CM | POA: Diagnosis not present

## 2016-01-22 DIAGNOSIS — I482 Chronic atrial fibrillation: Secondary | ICD-10-CM | POA: Diagnosis not present

## 2016-01-24 ENCOUNTER — Ambulatory Visit
Admission: RE | Admit: 2016-01-24 | Discharge: 2016-01-24 | Disposition: A | Discharge status: Home / Self Care | Payer: PPO | Source: Ambulatory Visit | Attending: Internal Medicine | Admitting: Internal Medicine

## 2016-01-24 DIAGNOSIS — N63 Unspecified lump in breast: Secondary | ICD-10-CM | POA: Diagnosis not present

## 2016-01-24 DIAGNOSIS — R921 Mammographic calcification found on diagnostic imaging of breast: Secondary | ICD-10-CM

## 2016-01-24 DIAGNOSIS — D242 Benign neoplasm of left breast: Secondary | ICD-10-CM | POA: Diagnosis not present

## 2016-01-24 DIAGNOSIS — N6012 Diffuse cystic mastopathy of left breast: Secondary | ICD-10-CM | POA: Diagnosis not present

## 2016-01-24 DIAGNOSIS — N6489 Other specified disorders of breast: Secondary | ICD-10-CM | POA: Diagnosis not present

## 2016-01-24 HISTORY — PX: BREAST EXCISIONAL BIOPSY: SUR124

## 2016-01-25 LAB — SURGICAL PATHOLOGY

## 2016-02-15 DIAGNOSIS — I482 Chronic atrial fibrillation: Secondary | ICD-10-CM | POA: Diagnosis not present

## 2016-02-26 DIAGNOSIS — N63 Unspecified lump in breast: Secondary | ICD-10-CM | POA: Diagnosis not present

## 2016-02-26 DIAGNOSIS — Z853 Personal history of malignant neoplasm of breast: Secondary | ICD-10-CM | POA: Diagnosis not present

## 2016-02-27 ENCOUNTER — Other Ambulatory Visit: Payer: Self-pay | Admitting: Surgery

## 2016-02-27 DIAGNOSIS — N63 Unspecified lump in unspecified breast: Secondary | ICD-10-CM

## 2016-03-04 ENCOUNTER — Encounter
Admission: RE | Admit: 2016-03-04 | Discharge: 2016-03-04 | Disposition: A | Discharge status: Home / Self Care | Payer: PPO | Source: Ambulatory Visit | Attending: Surgery | Admitting: Surgery

## 2016-03-04 ENCOUNTER — Other Ambulatory Visit: Payer: PPO

## 2016-03-04 DIAGNOSIS — I4891 Unspecified atrial fibrillation: Secondary | ICD-10-CM | POA: Diagnosis not present

## 2016-03-04 DIAGNOSIS — R0789 Other chest pain: Secondary | ICD-10-CM | POA: Diagnosis not present

## 2016-03-04 DIAGNOSIS — Z01812 Encounter for preprocedural laboratory examination: Secondary | ICD-10-CM | POA: Diagnosis not present

## 2016-03-04 DIAGNOSIS — E782 Mixed hyperlipidemia: Secondary | ICD-10-CM | POA: Diagnosis not present

## 2016-03-04 DIAGNOSIS — I10 Essential (primary) hypertension: Secondary | ICD-10-CM | POA: Diagnosis not present

## 2016-03-04 HISTORY — DX: Unsteadiness on feet: R26.81

## 2016-03-04 HISTORY — DX: Chronic obstructive pulmonary disease, unspecified: J44.9

## 2016-03-04 LAB — COMPREHENSIVE METABOLIC PANEL
ALT: 16 U/L (ref 14–54)
AST: 26 U/L (ref 15–41)
Albumin: 3.7 g/dL (ref 3.5–5.0)
Alkaline Phosphatase: 29 U/L — ABNORMAL LOW (ref 38–126)
Anion gap: 6 (ref 5–15)
BUN: 20 mg/dL (ref 6–20)
CHLORIDE: 105 mmol/L (ref 101–111)
CO2: 27 mmol/L (ref 22–32)
CREATININE: 1.2 mg/dL — AB (ref 0.44–1.00)
Calcium: 9 mg/dL (ref 8.9–10.3)
GFR calc Af Amer: 47 mL/min — ABNORMAL LOW (ref >60)
GFR, EST NON AFRICAN AMERICAN: 41 mL/min — AB (ref >60)
GLUCOSE: 91 mg/dL (ref 65–99)
Potassium: 3.9 mmol/L (ref 3.5–5.1)
Sodium: 138 mmol/L (ref 135–145)
Total Bilirubin: 0.7 mg/dL (ref 0.3–1.2)
Total Protein: 6.2 g/dL — ABNORMAL LOW (ref 6.5–8.1)

## 2016-03-04 LAB — CBC WITH DIFFERENTIAL/PLATELET
BASOS ABS: 0 10*3/uL (ref 0–0.1)
Basophils Relative: 1 %
Eosinophils Absolute: 0.1 10*3/uL (ref 0–0.7)
Eosinophils Relative: 3 %
HEMATOCRIT: 37.7 % (ref 35.0–47.0)
Hemoglobin: 12.6 g/dL (ref 12.0–16.0)
LYMPHS PCT: 26 %
Lymphs Abs: 1 10*3/uL (ref 1.0–3.6)
MCH: 30.8 pg (ref 26.0–34.0)
MCHC: 33.4 g/dL (ref 32.0–36.0)
MCV: 92.1 fL (ref 80.0–100.0)
Monocytes Absolute: 0.4 10*3/uL (ref 0.2–0.9)
Monocytes Relative: 9 %
NEUTROS ABS: 2.4 10*3/uL (ref 1.4–6.5)
Neutrophils Relative %: 61 %
PLATELETS: 131 10*3/uL — AB (ref 150–440)
RBC: 4.09 MIL/uL (ref 3.80–5.20)
RDW: 14.2 % (ref 11.5–14.5)
WBC: 3.8 10*3/uL (ref 3.6–11.0)

## 2016-03-04 LAB — SURGICAL PCR SCREEN
MRSA, PCR: NEGATIVE
Staphylococcus aureus: NEGATIVE

## 2016-03-04 NOTE — Patient Instructions (Addendum)
  Your procedure is scheduled on: 03/12/16 Tues Report to [facility] as instructed To find out your arrival time please call [phone] between 1PM - 3PM on 03/11/16 Mon [place]: Instructions that are not followed completely may result in serious medical risk, up to and including death, or upon the discretion of your surgeon and anesthesiologist your surgery may need to be rescheduled.    _x___ 1. Do not eat food or drink liquids after midnight. No gum chewing or hard candies.     ____ 2. No Alcohol for 24 hours before or after surgery.   ____ 3. Bring all medications with you on the day of surgery if instructed.    __x__ 4. Notify your doctor if there is any change in your medical condition     (cold, fever, infections).     Do not wear jewelry, make-up, hairpins, clips or nail polish.  Do not wear lotions, powders, or perfumes. You may wear deodorant.  Do not shave 48 hours prior to surgery. Men may shave face and neck.  Do not bring valuables to the hospital.    [facility] is not responsible for any belongings or valuables.               Contacts, dentures or bridgework may not be worn into surgery.  Leave your suitcase in the car. After surgery it may be brought to your room.  For patients admitted to the hospital, discharge time is determined by your treatment team.   Patients discharged the day of surgery will not be allowed to drive home.    Please read over the following fact sheets that you were given:   [facility] Preparing for Surgery and or MRSA Information   _x___ Take these medicines the morning of surgery with A SIP OF WATER:    1. None   2.  3.  4.  5.  6.  ____ Fleet Enema (as directed)   _x___ Use CHG Soap or sage wipes as directed on instruction sheet   ____ Use inhalers on the day of surgery and bring to hospital day of surgery  ____ Stop metformin 2 days prior to surgery    ____ Take 1/2 of usual insulin dose the night before surgery  and none on the morning of           surgery.   ____ Stop aspirin or coumadin, or plavix Stop aspirin tomorrow Ask Dr Ubaldo Glassing when to stop Coumadin  _x__ Stop Anti-inflammatories such as Advil, Aleve, Ibuprofen, Motrin, Naproxen,          Naprosyn, Goodies powders or aspirin products. Ok to take Tylenol.   ____ Stop supplements until after surgery.    ____ Bring C-Pap to the hospital.

## 2016-03-04 NOTE — Pre-Procedure Instructions (Signed)
Saw Dr Ubaldo Glassing on 03/04/16 office note on front of chart.

## 2016-03-07 DIAGNOSIS — R0789 Other chest pain: Secondary | ICD-10-CM | POA: Diagnosis not present

## 2016-03-12 ENCOUNTER — Encounter: Payer: Self-pay | Admitting: *Deleted

## 2016-03-12 ENCOUNTER — Ambulatory Visit
Admission: RE | Admit: 2016-03-12 | Discharge: 2016-03-12 | Disposition: A | Discharge status: Home / Self Care | Payer: PPO | Source: Ambulatory Visit | Attending: Surgery | Admitting: Surgery

## 2016-03-12 ENCOUNTER — Ambulatory Visit: Payer: PPO | Admitting: Anesthesiology

## 2016-03-12 ENCOUNTER — Encounter
Admission: RE | Disposition: A | Discharge status: Home / Self Care | Payer: Self-pay | Source: Ambulatory Visit | Attending: Surgery

## 2016-03-12 DIAGNOSIS — D242 Benign neoplasm of left breast: Secondary | ICD-10-CM | POA: Diagnosis not present

## 2016-03-12 DIAGNOSIS — N62 Hypertrophy of breast: Secondary | ICD-10-CM | POA: Insufficient documentation

## 2016-03-12 DIAGNOSIS — I4891 Unspecified atrial fibrillation: Secondary | ICD-10-CM | POA: Insufficient documentation

## 2016-03-12 DIAGNOSIS — I10 Essential (primary) hypertension: Secondary | ICD-10-CM | POA: Diagnosis not present

## 2016-03-12 DIAGNOSIS — R928 Other abnormal and inconclusive findings on diagnostic imaging of breast: Secondary | ICD-10-CM | POA: Diagnosis not present

## 2016-03-12 DIAGNOSIS — Z862 Personal history of diseases of the blood and blood-forming organs and certain disorders involving the immune mechanism: Secondary | ICD-10-CM | POA: Insufficient documentation

## 2016-03-12 DIAGNOSIS — N63 Unspecified lump in unspecified breast: Secondary | ICD-10-CM

## 2016-03-12 DIAGNOSIS — M1990 Unspecified osteoarthritis, unspecified site: Secondary | ICD-10-CM | POA: Diagnosis not present

## 2016-03-12 DIAGNOSIS — J449 Chronic obstructive pulmonary disease, unspecified: Secondary | ICD-10-CM | POA: Diagnosis not present

## 2016-03-12 DIAGNOSIS — Z853 Personal history of malignant neoplasm of breast: Secondary | ICD-10-CM | POA: Insufficient documentation

## 2016-03-12 DIAGNOSIS — N6082 Other benign mammary dysplasias of left breast: Secondary | ICD-10-CM | POA: Insufficient documentation

## 2016-03-12 DIAGNOSIS — D649 Anemia, unspecified: Secondary | ICD-10-CM | POA: Diagnosis not present

## 2016-03-12 HISTORY — PX: BREAST LUMPECTOMY WITH NEEDLE LOCALIZATION: SHX5759

## 2016-03-12 SURGERY — BREAST LUMPECTOMY WITH NEEDLE LOCALIZATION
Anesthesia: General | Laterality: Left | Wound class: Clean

## 2016-03-12 MED ORDER — PROPOFOL 10 MG/ML IV BOLUS
INTRAVENOUS | Status: DC | PRN
  Administered 2016-03-12: 100 mg via INTRAVENOUS

## 2016-03-12 MED ORDER — ONDANSETRON HCL 4 MG/2ML IJ SOLN
INTRAMUSCULAR | Status: DC | PRN
  Administered 2016-03-12: 4 mg via INTRAVENOUS

## 2016-03-12 MED ORDER — BUPIVACAINE-EPINEPHRINE 0.5% -1:200000 IJ SOLN
INTRAMUSCULAR | Status: DC | PRN
  Administered 2016-03-12: 10 mL

## 2016-03-12 MED ORDER — LACTATED RINGERS IV SOLN
INTRAVENOUS | Status: DC
  Administered 2016-03-12 (×2): via INTRAVENOUS

## 2016-03-12 MED ORDER — FENTANYL CITRATE (PF) 100 MCG/2ML IJ SOLN
INTRAMUSCULAR | Status: DC | PRN
  Administered 2016-03-12: 25 ug via INTRAVENOUS

## 2016-03-12 MED ORDER — EPHEDRINE SULFATE 50 MG/ML IJ SOLN
INTRAMUSCULAR | Status: DC | PRN
  Administered 2016-03-12: 10 mg via INTRAVENOUS
  Administered 2016-03-12: 5 mg via INTRAVENOUS
  Administered 2016-03-12: 10 mg via INTRAVENOUS

## 2016-03-12 MED ORDER — LIDOCAINE HCL (CARDIAC) 20 MG/ML IV SOLN
INTRAVENOUS | Status: DC | PRN
  Administered 2016-03-12: 100 mg via INTRAVENOUS

## 2016-03-12 MED ORDER — BUPIVACAINE-EPINEPHRINE (PF) 0.5% -1:200000 IJ SOLN
INTRAMUSCULAR | Status: AC
  Filled 2016-03-12: qty 30

## 2016-03-12 MED ORDER — FENTANYL CITRATE (PF) 100 MCG/2ML IJ SOLN: 25.0000 ug | INTRAMUSCULAR | Status: DC | PRN

## 2016-03-12 MED ORDER — ONDANSETRON HCL 4 MG/2ML IJ SOLN: 4.0000 mg | Freq: Once | INTRAMUSCULAR | Status: DC | PRN

## 2016-03-12 MED ORDER — FAMOTIDINE 20 MG PO TABS
ORAL_TABLET | ORAL | Status: AC
  Administered 2016-03-12: 20 mg via ORAL
  Filled 2016-03-12: qty 1

## 2016-03-12 MED ORDER — FAMOTIDINE 20 MG PO TABS
20.0000 mg | ORAL_TABLET | Freq: Once | ORAL | Status: AC
Start: 2016-03-12 — End: 2016-03-12
  Administered 2016-03-12: 20 mg via ORAL

## 2016-03-12 MED ORDER — DEXAMETHASONE SODIUM PHOSPHATE 10 MG/ML IJ SOLN
INTRAMUSCULAR | Status: DC | PRN
Start: 2016-03-12 — End: 2016-03-12
  Administered 2016-03-12: 10 mg via INTRAVENOUS

## 2016-03-12 SURGICAL SUPPLY — 25 items
BLADE SURG 15 STRL LF DISP TIS (BLADE) ×1 IMPLANT
BLADE SURG 15 STRL SS (BLADE) ×2
CANISTER SUCT 1200ML W/VALVE (MISCELLANEOUS) ×3 IMPLANT
CHLORAPREP W/TINT 26ML (MISCELLANEOUS) ×3 IMPLANT
DEVICE DUBIN SPECIMEN MAMMOGRA (MISCELLANEOUS) ×3 IMPLANT
DRAPE LAPAROTOMY 77X122 PED (DRAPES) ×3 IMPLANT
ELECT REM PT RETURN 9FT ADLT (ELECTROSURGICAL) ×3
ELECTRODE REM PT RTRN 9FT ADLT (ELECTROSURGICAL) ×1 IMPLANT
GLOVE BIO SURGEON STRL SZ7.5 (GLOVE) ×3 IMPLANT
GOWN STRL REUS W/ TWL LRG LVL3 (GOWN DISPOSABLE) ×2 IMPLANT
GOWN STRL REUS W/TWL LRG LVL3 (GOWN DISPOSABLE) ×4
KIT RM TURNOVER STRD PROC AR (KITS) ×3 IMPLANT
LABEL OR SOLS (LABEL) ×3 IMPLANT
LIQUID BAND (GAUZE/BANDAGES/DRESSINGS) ×3 IMPLANT
MARGIN MAP 10MM (MISCELLANEOUS) ×3 IMPLANT
NEEDLE HYPO 25X1 1.5 SAFETY (NEEDLE) ×3 IMPLANT
PACK BASIN MINOR ARMC (MISCELLANEOUS) ×3 IMPLANT
SUT CHROMIC 4 0 RB 1X27 (SUTURE) ×3 IMPLANT
SUT ETHILON 3-0 FS-10 30 BLK (SUTURE) ×3
SUT MNCRL 4-0 (SUTURE) ×2
SUT MNCRL 4-0 27XMFL (SUTURE) ×1
SUTURE EHLN 3-0 FS-10 30 BLK (SUTURE) ×1 IMPLANT
SUTURE MNCRL 4-0 27XMF (SUTURE) ×1 IMPLANT
SYRINGE 10CC LL (SYRINGE) ×3 IMPLANT
WATER STERILE IRR 1000ML POUR (IV SOLUTION) ×3 IMPLANT

## 2016-03-12 NOTE — Anesthesia Preprocedure Evaluation (Addendum)
Anesthesia Evaluation  Patient identified by MRN, date of birth, ID band Patient awake    Reviewed: Allergy & Precautions, NPO status , Patient's Chart, lab work & pertinent test results, reviewed documented beta blocker date and time   Airway Mallampati: II  TM Distance: >3 FB     Dental  (+) Chipped   Pulmonary           Cardiovascular hypertension, Pt. on medications + dysrhythmias Atrial Fibrillation      Neuro/Psych    GI/Hepatic   Endo/Other    Renal/GU      Musculoskeletal  (+) Arthritis ,   Abdominal   Peds  Hematology  (+) anemia ,   Anesthesia Other Findings [address].  Reproductive/Obstetrics                           Anesthesia Physical Anesthesia Plan  ASA: III  Anesthesia Plan: General   Post-op Pain Management:    Induction: Intravenous  Airway Management Planned: LMA  Additional Equipment:   Intra-op Plan:   Post-operative Plan:   Informed Consent: I have reviewed the patients History and Physical, chart, labs and discussed the procedure including the risks, benefits and alternatives for the proposed anesthesia with the patient or authorized representative who has indicated his/her understanding and acceptance.     Plan Discussed with: CRNA  Anesthesia Plan Comments:         Anesthesia Quick Evaluation

## 2016-03-12 NOTE — Transfer of Care (Signed)
2Immediate Anesthesia Transfer of Care Note  Patient: Kelly Bishop  Procedure(s) Performed: Procedure(s): BREAST LUMPECTOMY WITH NEEDLE LOCALIZATION (Left)  Patient Location: PACU  Anesthesia Type:General  Level of Consciousness: awake, alert  and oriented  Airway & Oxygen Therapy: Patient Spontanous Breathing  Post-op Assessment: Report given to RN and Post -op Vital signs reviewed and stable  Post vital signs: Reviewed and stable  Last Vitals:  Filed Vitals:   03/12/16 0912 03/12/16 1315  BP: 141/60 132/61  Pulse: 58 67  Temp: 36.6 C 36.6 C  Resp: 16 19    Last Pain: There were no vitals filed for this visit.       Complications: No apparent anesthesia complications

## 2016-03-12 NOTE — Op Note (Signed)
OPERATIVE REPORT  PREOPERATIVE  DIAGNOSIS: . Left breast mass  POSTOPERATIVE DIAGNOSIS: . Left breast mass  PROCEDURE: . Excision left breast mass  ANESTHESIA:  General  SURGEON: Rochel Brome  MD   INDICATIONS: . She recently noted a lump in the lateral aspect of her left breast. Ultrasound demonstrated a 10 mm density. Ultrasound-guided core needle biopsy demonstrated intraductal papilloma. Excision of the mass was recommended for further evaluation. She did have preoperative insertion of a Kopan's wire and mammogram images were reviewed.  The patient was placed on the operating table in the supine position under general anesthesia. The left arm was placed on a lateral arm support. The dressing was removed from the lateral aspect of the left breast exposing the Kopan's wire which was cut 2 cm from the skin. The breast was prepared with ChloraPrep and draped in a sterile manner. A curvilinear incision was made some 5 cm from the nipple from approximately 2:30 to 3:30 position of the breast and carried down through subcutaneous tissues. Electrocautery was used for hemostasis. A portion of breast tissue surrounding the wire was removed which was approximately 1.5 x 1.5 x 2.5 cm in dimension. X-ray of the specimen demonstrated the biopsy marker. The specimen was submitted for routine pathology  The wound was inspected and could see hemostasis was intact. The wound was infiltrated with 1% Xylocaine with epinephrine. The subcutaneous tissues were approximated with 4-0 chromic pursestring suture. The skin was closed with running 4-0 Monocryl subcuticular suture and LiquiBand. The patient tolerated surgery satisfactorily and was prepared for transfer to the recovery room  [facility].D.

## 2016-03-12 NOTE — H&P (Signed)
  She had recent findings of mass of the lateral aspect of the left breast. Needle biopsy demonstrated intraductal papilloma. She also has prior history of right breast cancer. Excision of this mass was recommended for further evaluation and treatment.  She reports no change in overall condition. Did have recent cardiac stress testing which was negative  The left side was marked YES. I discussed the plan for surgery

## 2016-03-12 NOTE — Anesthesia Procedure Notes (Signed)
Procedure Name: LMA Insertion Date/Time: 03/12/2016 12:22 PM Performed by: Justus [facility] Pre-anesthesia Checklist: Patient identified, Emergency Drugs available, Suction available and Patient being monitored Patient Re-evaluated:Patient Re-evaluated prior to inductionOxygen Delivery Method: Circle system utilized Preoxygenation: Pre-oxygenation with 100% oxygen Intubation Type: IV induction Ventilation: Mask ventilation without difficulty LMA: LMA inserted LMA Size: 3.5 Number of attempts: 1 Airway Equipment and Method: Stylet Placement Confirmation: positive ETCO2 and breath sounds checked- equal and bilateral Dental Injury: Teeth and Oropharynx as per pre-operative assessment

## 2016-03-12 NOTE — Anesthesia Postprocedure Evaluation (Signed)
Anesthesia Post Note  Patient: Kelly Bishop  Procedure(s) Performed: Procedure(s) (LRB): BREAST LUMPECTOMY WITH NEEDLE LOCALIZATION (Left)  Patient location during evaluation: PACU Anesthesia Type: General Level of consciousness: awake and alert Pain management: pain level controlled Vital Signs Assessment: post-procedure vital signs reviewed and stable Respiratory status: spontaneous breathing, nonlabored ventilation, respiratory function stable and patient connected to nasal cannula oxygen Cardiovascular status: blood pressure returned to baseline and stable Postop Assessment: no signs of nausea or vomiting Anesthetic complications: no    Last Vitals:  Filed Vitals:   03/12/16 1400 03/12/16 1417  BP:  141/64  Pulse:  65  Temp: 36.7 C 37 C  Resp:  17    Last Pain: There were no vitals filed for this visit.               Maloree Uplinger S

## 2016-03-12 NOTE — Discharge Instructions (Addendum)
Resume Coumadin today.  Resume aspirin tomorrow.  May shower tomorrow  Wear  bra as desired for support.          General Anesthesia, Adult General anesthesia is a sleep-like state of non-feeling produced by medicines (anesthetics). General anesthesia prevents you from being alert and feeling pain during a medical procedure. Your caregiver may recommend general anesthesia if your procedure:  Is long.  Is painful or uncomfortable.  Would be frightening to see or hear.  Requires you to be still.  Affects your breathing.  Causes significant blood loss. LET YOUR CAREGIVER KNOW ABOUT:  Allergies to food or medicine.  Medicines taken, including vitamins, herbs, eyedrops, over-the-counter medicines, and creams.  Use of steroids (by mouth or creams).  Previous problems with anesthetics or numbing medicines, including problems experienced by relatives.  History of bleeding problems or blood clots.  Previous surgeries and types of anesthetics received.  Possibility of pregnancy, if this applies.  Use of cigarettes, alcohol, or illegal drugs.  Any health condition(s), especially diabetes, sleep apnea, and high blood pressure. RISKS AND COMPLICATIONS General anesthesia rarely causes complications. However, if complications do occur, they can be life threatening. Complications include:  A lung infection.  A stroke.  A heart attack.  Waking up during the procedure. When this occurs, the patient may be unable to move and communicate that he or she is awake. The patient may feel severe pain. Older adults and adults with serious medical problems are more likely to have complications than adults who are young and healthy. Some complications can be prevented by answering all of your caregiver's questions thoroughly and by following all pre-procedure instructions. It is important to tell your caregiver if any of the pre-procedure instructions, especially those related to diet,  were not followed. Any food or liquid in the stomach can cause problems when you are under general anesthesia. BEFORE THE PROCEDURE  Ask your caregiver if you will have to spend the night at the hospital. If you will not have to spend the night, arrange to have an adult drive you and stay with you for 24 hours.  Follow your caregiver's instructions if you are taking dietary supplements or medicines. Your caregiver may tell you to stop taking them or to reduce your dosage.  Do not smoke for as long as possible before your procedure. If possible, stop smoking 3-6 weeks before the procedure.  Do not take new dietary supplements or medicines within 1 week of your procedure unless your caregiver approves them.  Do not eat within 8 hours of your procedure or as directed by your caregiver. Drink only clear liquids, such as water, black coffee (without milk or cream), and fruit juices (without pulp).  Do not drink within 3 hours of your procedure or as directed by your caregiver.  You may brush your teeth on the morning of the procedure, but make sure to spit out the toothpaste and water when finished. PROCEDURE  You will receive anesthetics through a mask, through an intravenous (IV) access tube, or through both. A doctor who specializes in anesthesia (anesthesiologist) or a nurse who specializes in anesthesia (nurse anesthetist) or both will stay with you throughout the procedure to make sure you remain unconscious. He or she will also watch your blood pressure, pulse, and oxygen levels to make sure that the anesthetics do not cause any problems. Once you are asleep, a breathing tube or mask may be used to help you breathe. AFTER THE PROCEDURE You will wake  up after the procedure is complete. You may be in the room where the procedure was performed or in a recovery area. You may have a sore throat if a breathing tube was used. You may also  feel:  Dizzy.  Weak.  Drowsy.  Confused.  Nauseous.  Cold. These are all normal responses and can be expected to last for up to 24 hours after the procedure is complete. A caregiver will tell you when you are ready to go home. This will usually be when you are fully awake and in stable condition.   This information is not intended to replace advice given to you by your health care provider. Make sure you discuss any questions you have with your health care provider.    AMBULATORY SURGERY  DISCHARGE INSTRUCTIONS   1) The drugs that you were given will stay in your system until tomorrow so for the next 24 hours you should not:  A) Drive an automobile B) Make any legal decisions C) Drink any alcoholic beverage   2) You may resume regular meals tomorrow.  Today it is better to start with liquids and gradually work up to solid foods.  You may eat anything you prefer, but it is better to start with liquids, then soup and crackers, and gradually work up to solid foods.   3) Please notify your doctor immediately if you have any unusual bleeding, trouble breathing, redness and pain at the surgery site, drainage, fever, or pain not relieved by medication.    4) Additional Instructions:  Please contact your physician with any problems or Same Day Surgery at [phone], Monday through Friday 6 am to 4 pm, or [place] at [facility] number at [phone].

## 2016-03-13 LAB — SURGICAL PATHOLOGY

## 2016-03-17 ENCOUNTER — Encounter: Payer: Self-pay | Admitting: Medical Oncology

## 2016-03-17 ENCOUNTER — Emergency Department
Admission: EM | Admit: 2016-03-17 | Discharge: 2016-03-17 | Disposition: A | Discharge status: Home / Self Care | Payer: PPO | Attending: Emergency Medicine | Admitting: Emergency Medicine

## 2016-03-17 ENCOUNTER — Emergency Department: Payer: PPO

## 2016-03-17 DIAGNOSIS — Z7982 Long term (current) use of aspirin: Secondary | ICD-10-CM | POA: Diagnosis not present

## 2016-03-17 DIAGNOSIS — I4891 Unspecified atrial fibrillation: Secondary | ICD-10-CM | POA: Insufficient documentation

## 2016-03-17 DIAGNOSIS — Y939 Activity, unspecified: Secondary | ICD-10-CM | POA: Diagnosis not present

## 2016-03-17 DIAGNOSIS — Y999 Unspecified external cause status: Secondary | ICD-10-CM | POA: Diagnosis not present

## 2016-03-17 DIAGNOSIS — S022XXA Fracture of nasal bones, initial encounter for closed fracture: Secondary | ICD-10-CM | POA: Diagnosis not present

## 2016-03-17 DIAGNOSIS — Y929 Unspecified place or not applicable: Secondary | ICD-10-CM | POA: Diagnosis not present

## 2016-03-17 DIAGNOSIS — S0101XA Laceration without foreign body of scalp, initial encounter: Secondary | ICD-10-CM | POA: Insufficient documentation

## 2016-03-17 DIAGNOSIS — Z91010 Allergy to peanuts: Secondary | ICD-10-CM | POA: Insufficient documentation

## 2016-03-17 DIAGNOSIS — Z853 Personal history of malignant neoplasm of breast: Secondary | ICD-10-CM | POA: Insufficient documentation

## 2016-03-17 DIAGNOSIS — Z85828 Personal history of other malignant neoplasm of skin: Secondary | ICD-10-CM | POA: Insufficient documentation

## 2016-03-17 DIAGNOSIS — Z79899 Other long term (current) drug therapy: Secondary | ICD-10-CM | POA: Insufficient documentation

## 2016-03-17 DIAGNOSIS — M810 Age-related osteoporosis without current pathological fracture: Secondary | ICD-10-CM | POA: Insufficient documentation

## 2016-03-17 DIAGNOSIS — J449 Chronic obstructive pulmonary disease, unspecified: Secondary | ICD-10-CM | POA: Insufficient documentation

## 2016-03-17 DIAGNOSIS — W01190A Fall on same level from slipping, tripping and stumbling with subsequent striking against furniture, initial encounter: Secondary | ICD-10-CM | POA: Diagnosis not present

## 2016-03-17 DIAGNOSIS — M1990 Unspecified osteoarthritis, unspecified site: Secondary | ICD-10-CM | POA: Diagnosis not present

## 2016-03-17 DIAGNOSIS — Z7901 Long term (current) use of anticoagulants: Secondary | ICD-10-CM | POA: Diagnosis not present

## 2016-03-17 DIAGNOSIS — S0990XA Unspecified injury of head, initial encounter: Secondary | ICD-10-CM | POA: Diagnosis not present

## 2016-03-17 DIAGNOSIS — W19XXXA Unspecified fall, initial encounter: Secondary | ICD-10-CM

## 2016-03-17 LAB — CBC WITH DIFFERENTIAL/PLATELET
BASOS ABS: 0 10*3/uL (ref 0–0.1)
Basophils Relative: 1 %
EOS ABS: 0.1 10*3/uL (ref 0–0.7)
EOS PCT: 3 %
HCT: 37.4 % (ref 35.0–47.0)
Hemoglobin: 13 g/dL (ref 12.0–16.0)
LYMPHS PCT: 22 %
Lymphs Abs: 1 10*3/uL (ref 1.0–3.6)
MCH: 31.9 pg (ref 26.0–34.0)
MCHC: 34.9 g/dL (ref 32.0–36.0)
MCV: 91.4 fL (ref 80.0–100.0)
MONO ABS: 0.3 10*3/uL (ref 0.2–0.9)
Monocytes Relative: 8 %
Neutro Abs: 3.1 10*3/uL (ref 1.4–6.5)
Neutrophils Relative %: 66 %
PLATELETS: 138 10*3/uL — AB (ref 150–440)
RBC: 4.09 MIL/uL (ref 3.80–5.20)
RDW: 14.2 % (ref 11.5–14.5)
WBC: 4.6 10*3/uL (ref 3.6–11.0)

## 2016-03-17 LAB — BASIC METABOLIC PANEL
ANION GAP: 7 (ref 5–15)
BUN: 24 mg/dL — AB (ref 6–20)
CALCIUM: 8.7 mg/dL — AB (ref 8.9–10.3)
CO2: 26 mmol/L (ref 22–32)
Chloride: 107 mmol/L (ref 101–111)
Creatinine, Ser: 1.05 mg/dL — ABNORMAL HIGH (ref 0.44–1.00)
GFR calc Af Amer: 56 mL/min — ABNORMAL LOW (ref >60)
GFR, EST NON AFRICAN AMERICAN: 48 mL/min — AB (ref >60)
GLUCOSE: 104 mg/dL — AB (ref 65–99)
POTASSIUM: 3.8 mmol/L (ref 3.5–5.1)
SODIUM: 140 mmol/L (ref 135–145)

## 2016-03-17 LAB — PROTIME-INR
INR: 1.13
PROTHROMBIN TIME: 14.7 s (ref 11.4–15.0)

## 2016-03-17 MED ORDER — LIDOCAINE-EPINEPHRINE (PF) 1 %-1:200000 IJ SOLN
INTRAMUSCULAR | Status: AC
  Administered 2016-03-17: 10:00:00
  Filled 2016-03-17: qty 30

## 2016-03-17 MED ORDER — ACETAMINOPHEN 325 MG PO TABS
650.0000 mg | ORAL_TABLET | Freq: Once | ORAL | Status: AC
  Administered 2016-03-17: 650 mg via ORAL
  Filled 2016-03-17: qty 2

## 2016-03-17 NOTE — Discharge Instructions (Signed)
Do not take Coumadin today, restart it tomorrow. Follow closely with your doctor. If you have increased pain headache numbness weakness chest pain shortness of breath or you feel worse in any way return to the emergency department

## 2016-03-17 NOTE — ED Provider Notes (Addendum)
[facility] Emergency Department Provider Note  ____________________________________________   I have reviewed the triage vital signs and the nursing notes.   HISTORY  Chief Complaint Fall and Head Laceration    HPI Kelly Bishop is a 82 y.o. female had a non-syncopal fall is warm. She tripped. Murmurs following. Bumped her head. Does take Coumadin. Has some pain to her nose and able small cut to the back of her head otherwise does not endorse any pain or injury. She denies any chest pain shortness of breath nausea vomiting abdominal pain or diarrhea. No prodrome or subsequent cardiac symptoms such as lightheadedness. She has no complaints and is eager to go home.      Past Medical History  Diagnosis Date  . Breast cancer, right breast ([name])     right breast Invasive mammary carcinoma grade 1  . Lymphopenia   . Anemia   . Thrombocytopenia ([place])   . B12 deficiency   . Osteoporosis July 2015    seen on DEXA scan-T score of -2.9 in the left neck femur  . Atrial fibrillation ([place])   . Osteoarthritis   . Spastic dysphonia   . Zenker's diverticulum   . Cataracts, bilateral   . Hemorrhoids   . Skin cancer   . Pancytopenia ([place])   . Breast cancer ([place])   . Unsteady gait   . COPD (chronic obstructive pulmonary disease) [facility])     Patient Active Problem List   Diagnosis Date Noted  . Breast cancer, right breast ([place]) 02/06/2015    Past Surgical History  Procedure Laterality Date  . L1-l4 laminectomy and t6 fusion  May [id number]    complicated by postoperative infection, more recently had recurrent pseudomonas infection at scar site March 2015  . Ultrasound guided biopsy of breast  Jan 27, 2014  . Breast excisional biopsy Left     x 20 years surgical bx  . Breast excisional biopsy Right 2015    breast cancer   . Breast excisional biopsy Left 01/24/2016    ultrasound - pathology pending  . Replacement total knee Bilateral   . Rotator cuff repair  Bilateral   . Breast lumpectomy with needle localization Left 03/12/2016    Procedure: BREAST LUMPECTOMY WITH NEEDLE LOCALIZATION;  Surgeon: Leonie Green, MD;  Location: [facility] ORS;  Service: General;  Laterality: Left;    Current Outpatient Rx  Name  Route  Sig  Dispense  Refill  . ALPRAZolam (XANAX) 0.5 MG tablet   Oral   Take 0.25 mg by mouth at bedtime.         Marland Kitchen aspirin 81 MG chewable tablet   Oral   Chew by mouth.         . bisacodyl (DULCOLAX) 5 MG EC tablet   Oral   Take 10 mg by mouth at bedtime.          . Cholecalciferol (VITAMIN D-1000 MAX ST) 1000 UNITS tablet   Oral   Take 1,000 Units by mouth daily.          . ciprofloxacin (CIPRO) 500 MG tablet   Oral   Take 500 mg by mouth 2 (two) times daily.          . Cyanocobalamin (RA VITAMIN B-12 TR) 1000 MCG TBCR   Oral   Take 1,000 mcg by mouth daily.          Marland Kitchen diltiazem (TAZTIA XT) 120 MG 24 hr capsule   Oral   Take 120  mg by mouth at bedtime.          . fexofenadine (ALLEGRA) 180 MG tablet   Oral   Take 180 mg by mouth daily.         . furosemide (LASIX) 40 MG tablet   Oral   Take 40 mg by mouth daily.         . potassium chloride SA (K-DUR,KLOR-CON) 20 MEQ tablet   Oral   Take 20 mEq by mouth daily.         . tamoxifen (NOLVADEX) 20 MG tablet   Oral   Take 20 mg by mouth daily.          Marland Kitchen warfarin (COUMADIN) 5 MG tablet   Oral   Take 5 mg by mouth daily at 6 PM. Alternates with 6mg  tablet i.e. 6,5,6,5,6,5...         . warfarin (COUMADIN) 6 MG tablet   Oral   Take 6 mg by mouth daily at 6 PM. Alternates with 5mg  tablet i.e. [id number]...           Allergies Ace inhibitors; Beta adrenergic blockers; Morphine and related; Peanut-containing drug products; and Codeine  Family History  Problem Relation Age of Onset  . Throat cancer Mother   . Prostate cancer Brother   . Breast cancer Brother   . Leukemia Brother   . Lung cancer Father     Social  History Social History  Substance Use Topics  . Smoking status: Never Smoker   . Smokeless tobacco: Never Used  . Alcohol Use: No    Review of Systems Constitutional: No fever/chills Eyes: No visual changes. ENT: No sore throat. No stiff neck no neck pain Cardiovascular: Denies chest pain. Respiratory: Denies shortness of breath. Gastrointestinal:   no vomiting.  No diarrhea.  No constipation. Genitourinary: Negative for dysuria. Musculoskeletal: Negative lower extremity swelling Skin: Negative for rash. Neurological: Negative for headaches, focal weakness or numbness. 10-point ROS otherwise negative.  ____________________________________________   PHYSICAL EXAM:  VITAL SIGNS: ED Triage Vitals  Enc Vitals Group     BP 03/17/16 0805 180/8 mmHg     Pulse Rate 03/17/16 0805 75     Resp 03/17/16 0805 20     Temp 03/17/16 0805 98 F (36.7 C)     Temp Source 03/17/16 0805 Oral     SpO2 03/17/16 0805 97 %     Weight 03/17/16 0805 142 lb (64.411 kg)     Height 03/17/16 0805 5\' 3"  (1.6 m)     Head Cir --      Peak Flow --      Pain Score 03/17/16 0806 7     Pain Loc --      Pain Edu? --      Excl. in [place]? --     Constitutional: Alert and oriented. Well appearing and in no acute distress. Eyes: Conjunctivae are normal. PERRL. EOMI. Head: Small laceration with no skull fracture palpated to the occiput. Nose: No congestion/rhinnorhea.No bleeding, Mouth/Throat: Mucous membranes are moist.  Oropharynx non-erythematous. There is no midface instability there is no jaw pain or tenderness, there is no other pathology noted aside from bruising to the proximal nose near the right eye. No entrapment. No hyphema. Neck: No stridor.   Nontender with no meningismus Cardiovascular: Normal rate, Occasionally irregular. Grossly normal heart sounds.  Good peripheral circulation. Respiratory: Normal respiratory effort.  No retractions. Lungs CTAB. Abdominal: Soft and nontender. No distention.  No guarding no rebound Back:  There is no focal tenderness or step off there is no midline tenderness there are no lesions noted. there is no CVA tenderness Musculoskeletal: No lower extremity tenderness. No joint effusions, no DVT signs strong distal pulses no edema Neurologic:  Normal speech and language. No gross focal neurologic deficits are appreciated.  Skin:  Skin is warm, dry and intact. Bruising noted to the proximal nose on the right side. See above regarding small laceration partially 1 cm bleeding controlled Psychiatric: Mood and affect are normal. Speech and behavior are normal.  ____________________________________________   LABS (all labs ordered are listed, but only abnormal results are displayed)  Labs Reviewed  CBC WITH DIFFERENTIAL/PLATELET - Abnormal; Notable for the following:    Platelets 138 (*)    All other components within normal limits  BASIC METABOLIC PANEL - Abnormal; Notable for the following:    Glucose, Bld 104 (*)    BUN 24 (*)    Creatinine, Ser 1.05 (*)    Calcium 8.7 (*)    GFR calc non Af Amer 48 (*)    GFR calc Af Amer 56 (*)    All other components within normal limits  PROTIME-INR   ____________________________________________  EKG  I personally interpreted any EKGs ordered by me or triage  ____________________________________________  RADIOLOGY  I reviewed any imaging ordered by me or triage that were performed during my shift and, if possible, patient and/or family made aware of any abnormal findings. ____________________________________________   PROCEDURES  Procedure(s) performed: LACERATION REPAIR Performed by: Schuyler Amor Authorized by: Schuyler Amor Consent: Verbal consent obtained. Risks and benefits: risks, benefits and alternatives were discussed Consent given by: patient Patient identity confirmed: provided demographic data Prepped and Draped in normal sterile fashion Wound explored  Laceration Location:  Occiput  Laceration Length: 1 cm  No Foreign Bodies seen or palpated  Anesthesia: local infiltration  Local anesthetic: lidocaine 1 % with epinephrine  Anesthetic total: 2 ml  Irrigation method: syringe Amount of cleaning: standard  Skin closure: Staple   Number of sutures: 1   Technique: Staple   Patient tolerance: Patient tolerated the procedure well with no immediate complications.   Critical Care performed: None  ____________________________________________   INITIAL IMPRESSION / ASSESSMENT AND PLAN / ED COURSE  Pertinent labs & imaging results that were available during my care of the patient were reviewed by me and considered in my medical decision making (see chart for details).  Patient with a non-syncopal fall, she does take Coumadin unfortunately today her INR is not elevated. She is also on aspirin. This reason a CT scan was performed. CT scan shows no acute intracranial pathology which is consistent with her exam.No evidence of further facial exam consistent with a requirement for visual CT. No jaw pain no maxillary pain, no evidence of slight rheumatic injury. No evidence of entrapment or orbital fracture. Patient eager to go home. We will hold her Coumadin today and have her restart it tomorrow. ____________________________________________   FINAL CLINICAL IMPRESSION(S) / ED DIAGNOSES  Final diagnoses:  None      This chart was dictated using voice recognition software.  Despite best efforts to proofread,  errors can occur which can change meaning.     Schuyler Amor, MD 03/17/16 [place], MD 03/17/16 1020

## 2016-03-17 NOTE — ED Notes (Addendum)
Pt reports she got up out of the bed around 0300 this am when her "feet got tangled up" and she fell forward onto her head then back onto another table. Pt has swelling noted to rt side of forehead and lac to back of left side of head. Pt reports she takes warfarin at home. Bleeding has stopped to head. Pt a/o x 4. Denies any other pain.

## 2016-03-20 DIAGNOSIS — S0101XD Laceration without foreign body of scalp, subsequent encounter: Secondary | ICD-10-CM | POA: Diagnosis not present

## 2016-04-03 DIAGNOSIS — L570 Actinic keratosis: Secondary | ICD-10-CM | POA: Diagnosis not present

## 2016-04-03 DIAGNOSIS — L821 Other seborrheic keratosis: Secondary | ICD-10-CM | POA: Diagnosis not present

## 2016-04-03 DIAGNOSIS — L820 Inflamed seborrheic keratosis: Secondary | ICD-10-CM | POA: Diagnosis not present

## 2016-04-03 DIAGNOSIS — L578 Other skin changes due to chronic exposure to nonionizing radiation: Secondary | ICD-10-CM | POA: Diagnosis not present

## 2016-04-18 DIAGNOSIS — I482 Chronic atrial fibrillation: Secondary | ICD-10-CM | POA: Diagnosis not present

## 2016-04-29 ENCOUNTER — Telehealth: Payer: Self-pay | Admitting: *Deleted

## 2016-04-29 NOTE — Telephone Encounter (Signed)
To report that she has a vaginal discharge times 2 yearsand asking if she can go on something other than Tamoxifen. She is having to change her panties twice a day. She has stopped taking the Tamoxifen on 04/26/16 and has noticed there is not quite as bad as when she was taking it. She complains of itching and burning feels raw in vaginal area. Please advise

## 2016-04-29 NOTE — Telephone Encounter (Signed)
Per Dr Rogue Bussing, stop Tamoxifen and see me in 1 month.  Patient agrees to stay off Tamoxifen and appt moved from October to 05/31/16

## 2016-05-09 DIAGNOSIS — M810 Age-related osteoporosis without current pathological fracture: Secondary | ICD-10-CM | POA: Diagnosis not present

## 2016-05-09 DIAGNOSIS — I482 Chronic atrial fibrillation: Secondary | ICD-10-CM | POA: Diagnosis not present

## 2016-05-09 DIAGNOSIS — E538 Deficiency of other specified B group vitamins: Secondary | ICD-10-CM | POA: Diagnosis not present

## 2016-05-15 DIAGNOSIS — E538 Deficiency of other specified B group vitamins: Secondary | ICD-10-CM | POA: Diagnosis not present

## 2016-05-15 DIAGNOSIS — I10 Essential (primary) hypertension: Secondary | ICD-10-CM | POA: Insufficient documentation

## 2016-05-15 DIAGNOSIS — E782 Mixed hyperlipidemia: Secondary | ICD-10-CM | POA: Insufficient documentation

## 2016-05-15 DIAGNOSIS — I482 Chronic atrial fibrillation: Secondary | ICD-10-CM | POA: Diagnosis not present

## 2016-05-31 ENCOUNTER — Inpatient Hospital Stay: Payer: PPO | Attending: Internal Medicine | Admitting: Internal Medicine

## 2016-05-31 ENCOUNTER — Encounter: Payer: Self-pay | Admitting: Internal Medicine

## 2016-05-31 DIAGNOSIS — M25552 Pain in left hip: Secondary | ICD-10-CM | POA: Insufficient documentation

## 2016-05-31 DIAGNOSIS — Z79899 Other long term (current) drug therapy: Secondary | ICD-10-CM | POA: Diagnosis not present

## 2016-05-31 DIAGNOSIS — M810 Age-related osteoporosis without current pathological fracture: Secondary | ICD-10-CM | POA: Diagnosis not present

## 2016-05-31 DIAGNOSIS — Z806 Family history of leukemia: Secondary | ICD-10-CM | POA: Diagnosis not present

## 2016-05-31 DIAGNOSIS — Z85828 Personal history of other malignant neoplasm of skin: Secondary | ICD-10-CM | POA: Insufficient documentation

## 2016-05-31 DIAGNOSIS — Z87440 Personal history of urinary (tract) infections: Secondary | ICD-10-CM | POA: Insufficient documentation

## 2016-05-31 DIAGNOSIS — Z923 Personal history of irradiation: Secondary | ICD-10-CM | POA: Diagnosis not present

## 2016-05-31 DIAGNOSIS — N898 Other specified noninflammatory disorders of vagina: Secondary | ICD-10-CM | POA: Diagnosis not present

## 2016-05-31 DIAGNOSIS — Z7901 Long term (current) use of anticoagulants: Secondary | ICD-10-CM

## 2016-05-31 DIAGNOSIS — E538 Deficiency of other specified B group vitamins: Secondary | ICD-10-CM | POA: Insufficient documentation

## 2016-05-31 DIAGNOSIS — Z801 Family history of malignant neoplasm of trachea, bronchus and lung: Secondary | ICD-10-CM | POA: Insufficient documentation

## 2016-05-31 DIAGNOSIS — Z8042 Family history of malignant neoplasm of prostate: Secondary | ICD-10-CM | POA: Insufficient documentation

## 2016-05-31 DIAGNOSIS — Z803 Family history of malignant neoplasm of breast: Secondary | ICD-10-CM | POA: Diagnosis not present

## 2016-05-31 DIAGNOSIS — Z79810 Long term (current) use of selective estrogen receptor modulators (SERMs): Secondary | ICD-10-CM | POA: Insufficient documentation

## 2016-05-31 DIAGNOSIS — Z170 Estrogen receptor positive status [ER+]: Secondary | ICD-10-CM | POA: Insufficient documentation

## 2016-05-31 DIAGNOSIS — D242 Benign neoplasm of left breast: Secondary | ICD-10-CM | POA: Diagnosis not present

## 2016-05-31 DIAGNOSIS — M1990 Unspecified osteoarthritis, unspecified site: Secondary | ICD-10-CM | POA: Insufficient documentation

## 2016-05-31 DIAGNOSIS — C50811 Malignant neoplasm of overlapping sites of right female breast: Secondary | ICD-10-CM | POA: Diagnosis not present

## 2016-05-31 DIAGNOSIS — I4891 Unspecified atrial fibrillation: Secondary | ICD-10-CM | POA: Insufficient documentation

## 2016-05-31 DIAGNOSIS — Z7982 Long term (current) use of aspirin: Secondary | ICD-10-CM | POA: Insufficient documentation

## 2016-05-31 DIAGNOSIS — G8929 Other chronic pain: Secondary | ICD-10-CM | POA: Insufficient documentation

## 2016-05-31 DIAGNOSIS — J449 Chronic obstructive pulmonary disease, unspecified: Secondary | ICD-10-CM | POA: Insufficient documentation

## 2016-05-31 NOTE — Progress Notes (Signed)
Pt repots she stopped taking Tomoxifen about 6 weeks ago because she thought she was perhaps getting a yeast infection related to a side effect of the medication.  However, had followed up with PCP and was told she had a bladder infection.  Pt reports she didn't know and was not sure if she had a yeast infection or if it was .

## 2016-05-31 NOTE — Assessment & Plan Note (Addendum)
#  Right-sided breast cancer stage I ER/PR positive HER-2/neu negative. Patient is currently on adjuvant tamoxifen [given history of osteoporosis]. Clinically no evidence of recurrence noted.Tam on Hold for 6 weeks sec to vaginal discharge.  # vaginal discharge/ mild bloody- recommend Gyn evaluation.   # Left breast- s/p excision s/p papilloma.  # Follow up in  7 weeks/labs.

## 2016-05-31 NOTE — Progress Notes (Signed)
[facility] OFFICE PROGRESS NOTE  Patient Care Team: [name], MD as PCP - General (Internal Medicine)   SUMMARY OF ONCOLOGIC HISTORY:  Oncology History   # 2015- RIGHT BREAST CA STAGE I [T1aN0]ER/PR- POS; Her-2 NEU-NEG; s/p Lumpec & RT [Dr.Crystal] on tamoxifen [given osteoporosis]  # Left breast papilloma-June 2017  # A.fib on Coumadin;   # Osteoporosis      Breast cancer, right breast ([place])   02/06/2015 Initial Diagnosis    Breast cancer, right breast ([place])      Malignant neoplasm of overlapping sites of right breast ([place])   05/31/2016 Initial Diagnosis    Malignant neoplasm of overlapping sites of right breast [facility])       INTERVAL HISTORY:  A very pleasant 82 year old female patient with above history of stage I breast cancer of the right side currently on adjuvant tamoxifen/given history of osteoporosis is here for follow-up. Currently off tamoxifen  appx 6 weeks ago sec to vaginal discharge.  Interim patient had a left breast cyst excision- papilloma.  Patient states that she noted to have vaginal discharge and bloody intermittently for the last few weeks. She stopped taking tamoxifen.  She was recently treated with antibiotics for UTI.  She has chronic left hip pain not any better or worse.  REVIEW OF SYSTEMS:  A complete 10 point review of system is done which is negative except mentioned above/history of present illness.   PAST MEDICAL HISTORY :  Past Medical History:  Diagnosis Date  . Anemia   . Atrial fibrillation ([place])   . B12 deficiency   . Breast cancer ([place])   . Breast cancer, right breast ([name])    right breast Invasive mammary carcinoma grade 1  . Cataracts, bilateral   . COPD (chronic obstructive pulmonary disease) ([place])   . Hemorrhoids   . Lymphopenia   . Osteoarthritis   . Osteoporosis July 2015   seen on DEXA scan-T score of -2.9 in the left neck femur  . Pancytopenia ([place])   . Skin cancer   . Spastic dysphonia   .  Thrombocytopenia ([place])   . Unsteady gait   . Zenker's diverticulum     PAST SURGICAL HISTORY :   Past Surgical History:  Procedure Laterality Date  . BREAST EXCISIONAL BIOPSY Left    x 20 years surgical bx  . BREAST EXCISIONAL BIOPSY Right 2015   breast cancer   . BREAST EXCISIONAL BIOPSY Left 01/24/2016   ultrasound - pathology pending  . BREAST LUMPECTOMY WITH NEEDLE LOCALIZATION Left 03/12/2016   Procedure: BREAST LUMPECTOMY WITH NEEDLE LOCALIZATION;  Surgeon: Leonie Green, MD;  Location: [facility] ORS;  Service: General;  Laterality: Left;  . L1-L4 laminectomy and T6 fusion  May 0175   complicated by postoperative infection, more recently had recurrent pseudomonas infection at scar site March 2015  . REPLACEMENT TOTAL KNEE Bilateral   . ROTATOR CUFF REPAIR Bilateral   . ultrasound guided biopsy of breast  Jan 27, 2014    FAMILY HISTORY :   Family History  Problem Relation Age of Onset  . Throat cancer Mother   . Prostate cancer Brother   . Breast cancer Brother   . Leukemia Brother   . Lung cancer Father     SOCIAL HISTORY:   Social History  Substance Use Topics  . Smoking status: Never Smoker  . Smokeless tobacco: Never Used  . Alcohol use No    ALLERGIES:  is allergic to ace inhibitors; beta  adrenergic blockers; morphine and related; peanut-containing drug products; and codeine.  MEDICATIONS:  Current Outpatient Prescriptions  Medication Sig Dispense Refill  . ALPRAZolam (XANAX) 0.5 MG tablet Take 0.25 mg by mouth at bedtime.    Marland Kitchen aspirin 81 MG chewable tablet Chew by mouth.    . bisacodyl (DULCOLAX) 5 MG EC tablet Take 10 mg by mouth at bedtime.     . Cholecalciferol (VITAMIN D-1000 MAX ST) 1000 UNITS tablet Take 1,000 Units by mouth daily.     . ciprofloxacin (CIPRO) 500 MG tablet Take 500 mg by mouth 2 (two) times daily.     . Cyanocobalamin (RA VITAMIN B-12 TR) 1000 MCG TBCR Take 1,000 mcg by mouth daily.     Marland Kitchen diltiazem (TAZTIA XT) 120 MG 24 hr capsule  Take 120 mg by mouth at bedtime.     . fexofenadine (ALLEGRA) 180 MG tablet Take 180 mg by mouth daily.    . furosemide (LASIX) 40 MG tablet Take 40 mg by mouth daily.    . potassium chloride SA (K-DUR,KLOR-CON) 20 MEQ tablet Take 20 mEq by mouth daily.    . tamoxifen (NOLVADEX) 20 MG tablet Take 20 mg by mouth daily.     Marland Kitchen warfarin (COUMADIN) 5 MG tablet Take 5 mg by mouth daily at 6 PM. Alternates with '6mg'$  tablet i.e. 6,5,6,5,6,5...    . warfarin (COUMADIN) 6 MG tablet Take 6 mg by mouth daily at 6 PM. Alternates with '5mg'$  tablet i.e. 5,6,5,6,5,6...     No current facility-administered medications for this visit.     PHYSICAL EXAMINATION: ECOG PERFORMANCE STATUS: 0 - Asymptomatic  BP (!) 143/82 (BP Location: Left Arm, Patient Position: Sitting)   Pulse 66   Temp 98.1 F (36.7 C) (Tympanic)   Resp 17   Ht '5\' 3"'$  (1.6 m)   Wt 150 lb 12.8 oz (68.4 kg)   BMI 26.71 kg/m   Filed Weights   05/31/16 1517  Weight: 150 lb 12.8 oz (68.4 kg)    GENERAL: Well-nourished well-developed; Alert, no distress and comfortable.  She is alone. She needs help getting on the exam table.She walks with a cane. EYES: no pallor or icterus OROPHARYNX: no thrush or ulceration; good dentition  NECK: supple, no masses felt LYMPH:  no palpable lymphadenopathy in the cervical, axillary or inguinal regions LUNGS: clear to auscultation and  No wheeze or crackles HEART/CVS: regular rate & rhythm and no murmurs; No lower extremity edema ABDOMEN:abdomen soft, non-tender and normal bowel sounds Musculoskeletal:no cyanosis of digits and no clubbing  PSYCH: alert & oriented x 3 with fluent speech NEURO: no focal motor/sensory deficits SKIN:  Multiple ecchymosis from Coumadin.    LABORATORY DATA:  I have reviewed the data as listed    Component Value Date/Time   NA 140 03/17/2016 0811   NA 147 (H) 08/14/2014 0416   K 3.8 03/17/2016 0811   K 3.9 08/14/2014 0416   CL 107 03/17/2016 0811   CL 114 (H)  08/14/2014 0416   CO2 26 03/17/2016 0811   CO2 25 08/14/2014 0416   GLUCOSE 104 (H) 03/17/2016 0811   GLUCOSE 91 08/14/2014 0416   BUN 24 (H) 03/17/2016 0811   BUN 9 08/14/2014 0416   CREATININE 1.05 (H) 03/17/2016 0811   CREATININE 0.90 08/14/2014 0416   CALCIUM 8.7 (L) 03/17/2016 0811   CALCIUM 8.0 (L) 08/14/2014 0416   PROT 6.2 (L) 03/04/2016 1331   PROT 6.9 08/13/2014 2058   ALBUMIN 3.7 03/04/2016 1331   ALBUMIN  3.2 (L) 08/13/2014 2058   AST 26 03/04/2016 1331   AST 32 08/13/2014 2058   ALT 16 03/04/2016 1331   ALT 22 08/13/2014 2058   ALKPHOS 29 (L) 03/04/2016 1331   ALKPHOS 50 08/13/2014 2058   BILITOT 0.7 03/04/2016 1331   BILITOT 0.4 08/13/2014 2058   GFRNONAA 48 (L) 03/17/2016 0811   GFRNONAA >60 08/14/2014 0416   GFRNONAA 42 (L) 05/03/2014 1105   GFRAA 56 (L) 03/17/2016 0811   GFRAA >60 08/14/2014 0416   GFRAA 49 (L) 05/03/2014 1105    No results found for: SPEP, UPEP  Lab Results  Component Value Date   WBC 4.6 03/17/2016   NEUTROABS 3.1 03/17/2016   HGB 13.0 03/17/2016   HCT 37.4 03/17/2016   MCV 91.4 03/17/2016   PLT 138 (L) 03/17/2016      Chemistry      Component Value Date/Time   NA 140 03/17/2016 0811   NA 147 (H) 08/14/2014 0416   K 3.8 03/17/2016 0811   K 3.9 08/14/2014 0416   CL 107 03/17/2016 0811   CL 114 (H) 08/14/2014 0416   CO2 26 03/17/2016 0811   CO2 25 08/14/2014 0416   BUN 24 (H) 03/17/2016 0811   BUN 9 08/14/2014 0416   CREATININE 1.05 (H) 03/17/2016 0811   CREATININE 0.90 08/14/2014 0416      Component Value Date/Time   CALCIUM 8.7 (L) 03/17/2016 0811   CALCIUM 8.0 (L) 08/14/2014 0416   ALKPHOS 29 (L) 03/04/2016 1331   ALKPHOS 50 08/13/2014 2058   AST 26 03/04/2016 1331   AST 32 08/13/2014 2058   ALT 16 03/04/2016 1331   ALT 22 08/13/2014 2058   BILITOT 0.7 03/04/2016 1331   BILITOT 0.4 08/13/2014 2058         ASSESSMENT & PLAN:   Malignant neoplasm of overlapping sites of right breast ([place]) # Right-sided  breast cancer stage I ER/PR positive HER-2/neu negative. Patient is currently on adjuvant tamoxifen [given history of osteoporosis]. Clinically no evidence of recurrence noted.Tam on Hold for 6 weeks sec to vaginal discharge.  # vaginal discharge/ mild bloody- recommend Gyn evaluation.   # Left breast- s/p excision s/p papilloma.  # Follow up in  7 weeks/labs.        Cammie Sickle, MD 05/31/2016 5:43 PM

## 2016-06-03 ENCOUNTER — Other Ambulatory Visit: Payer: Self-pay

## 2016-06-03 DIAGNOSIS — C50811 Malignant neoplasm of overlapping sites of right female breast: Secondary | ICD-10-CM

## 2016-06-03 DIAGNOSIS — N950 Postmenopausal bleeding: Secondary | ICD-10-CM | POA: Diagnosis not present

## 2016-06-03 DIAGNOSIS — N898 Other specified noninflammatory disorders of vagina: Secondary | ICD-10-CM | POA: Diagnosis not present

## 2016-06-05 DIAGNOSIS — Z23 Encounter for immunization: Secondary | ICD-10-CM | POA: Diagnosis not present

## 2016-06-17 DIAGNOSIS — N950 Postmenopausal bleeding: Secondary | ICD-10-CM | POA: Diagnosis not present

## 2016-06-17 DIAGNOSIS — R938 Abnormal findings on diagnostic imaging of other specified body structures: Secondary | ICD-10-CM | POA: Diagnosis not present

## 2016-06-17 DIAGNOSIS — I482 Chronic atrial fibrillation: Secondary | ICD-10-CM | POA: Diagnosis not present

## 2016-06-26 NOTE — H&P (Signed)
HPI:  Pt presents for a preoperative visit to schedule a D&C, hysteroscopy for postmenopausal bleeding.  She has a hx of: personal hx of breast cancer, on tamoxifen that she stopped when she started having vaginal burning/UTI sx and discharge  Workup has included: EMBx- unable to be completed due to cervical stenosis   TVUS: Thickened ES: 76mm  Hx of 2 D&C in past during infertility workup Hx of back surgery with complications, including infection. 9 rods in back. Position awake. Hx of COPD Hx of HTN  Past Medical History:  has a past medical history of Allergic state; Atrial fibrillation (CMS-[name]); Bilateral cataracts; Breast cancer (CMS-[name]) (05/04/2014); Colon polyp; COPD (chronic obstructive pulmonary disease) (CMS-[name]); Heart disease; History of colon polyps; Hyperlipidemia; Hypertension; Lumbar disc disease (05/04/2014); Meningioma (CMS-[name]); Osteoarthritis; Osteoporosis, senile; Skin cancer; and Spastic dysphonia.  Past Surgical History:  has a past surgical history that includes Cervical discectomy; Total knee arthroplasty; Cervical fusion; Lumbar laminectomy; Joint replacement; Breast surgery; Spine surgery; incision & drainage postop wound infection back (N/A, 11/18/2013); Bilateral cataract removal (2007); Left rotator cuff repair (10/26/2003); Right subacromial decompression/rotator cuff repair (01/13/08); Knee arthroscopy (Left, 06/19/2012); and Mastectomy (Right, May, 2015). Family History: family history includes Breast cancer in her brother; Hearing loss in her father and sister; Heart disease in her brother; Leukemia in her brother; Lung cancer in her father; Neurological disorder in her sister; Stomach cancer in her father; Throat cancer in her mother; Thyroid disease in her sister. Social History:  reports that she has never smoked. She has never used smokeless tobacco. She reports that she does not drink alcohol or use illicit drugs. OB/GYN History:     OB History    No  data available      Allergies: is allergic to ace inhibitors; beta-blockers (beta-adrenergic blocking agts); codeine; morphine; and peanut. Medications:  Current Outpatient Prescriptions:  .  ALPRAZolam (XANAX) 0.5 MG tablet, TAKE ONE TABLET BY MOUTH DAILY AS NEEDED, Disp: 30 tablet, Rfl: 0 .  amoxicillin (AMOXIL) 500 MG capsule, Take 500 mg by mouth as directed. Reported on 11/23/2015 , Disp: , Rfl: 1 .  aspirin 81 MG chewable tablet, Take 81 mg by mouth daily.  , Disp: , Rfl:  .  bisacodyl (DULCOLAX) 5 mg EC tablet, Take by mouth 2 (two) times daily.  , Disp: , Rfl:  .  CALCIUM-VITAMIN D3 ORAL, Take by mouth once daily., Disp: , Rfl:  .  cholecalciferol (CHOLECALCIFEROL) 1,000 unit tablet, Take 1,000 Units by mouth., Disp: , Rfl:  .  cyanocobalamin (VITAMIN B12) 1000 MCG tablet, Take 1,000 mcg by mouth once daily., Disp: , Rfl:  .  diltiazem (TIAZAC) 120 MG ER capsule, Take 1 capsule (120 mg total) by mouth once daily. Okay to dispense generic., Disp: 90 capsule, Rfl: 3 .  fexofenadine (ALLEGRA) 180 MG tablet, Take 180 mg by mouth once daily.  , Disp: , Rfl:  .  FUROsemide (LASIX) 20 MG tablet, Take 1 tablet (20 mg total) by mouth once daily as needed for Edema., Disp: 30 tablet, Rfl: 11 .  potassium chloride (KLOR-CON) 10 MEQ ER tablet, Take 1 tablet (10 mEq total) by mouth once daily as needed. When taking lasix, Disp: 30 tablet, Rfl: 11 .  tamoxifen (NOLVADEX) 20 MG tablet, Take 20 mg by mouth once daily.  , Disp: , Rfl:  .  warfarin (COUMADIN) 5 MG tablet, Take 1 tablet (5 mg total) by mouth once daily., Disp: 90 tablet, Rfl: 3 .  warfarin (COUMADIN) 6  MG tablet, Take 1 tablet (6 mg total) by mouth once daily., Disp: 90 tablet, Rfl: 3  Review of Systems: No SOB, no palpitations or chest pain, no new lower extremity edema, no nausea or vomiting or bowel or bladder complaints. See HPI for gyn specific ROS.   Exam:      Vitals:   06/17/16 1637  BP: 130/70  Pulse: 68     WDWN white female in NAD Body mass index is 26.54 kg/(m^2).  General: Patient is well-groomed, well-nourished, appears stated age in no acute distress  HEENT: head is atraumatic and normocephalic, trachea is midline, neck is supple with no palpable nodules  CV: Regular rhythm and normal heart rate, no murmur  Pulm: Clear to auscultation throughout lung fields with no wheezing, crackles, or rhonchi. No increased work of breathing  Abdomen: soft , no mass, non-tender, no rebound tenderness, no hepatomegaly  Pelvic: tanner stage 5 ,  Deferred  Impression:   The primary encounter diagnosis was Post-menopausal bleeding. A diagnosis of Increased endometrial stripe thickness was also pertinent to this visit.    Plan:   -  Preoperative visit: D&C hysteroscopy for postmenopausal bleeding with thickened endometrial stripe. Consents signed today. Risks of surgery were discussed with the patient including but not limited to: bleeding which may require transfusion; infection which may require antibiotics; injury to uterus or surrounding organs; intrauterine scarring which may impair future fertility; need for additional procedures including laparotomy or laparoscopy; and other postoperative/anesthesia complications. Written informed consent was obtained.  This is a scheduled same-day surgery. She will have a postop visit in 2 weeks to review operative findings and pathology.  -

## 2016-06-27 ENCOUNTER — Encounter
Admission: RE | Admit: 2016-06-27 | Discharge: 2016-06-27 | Disposition: A | Discharge status: Home / Self Care | Payer: PPO | Source: Ambulatory Visit | Attending: Obstetrics and Gynecology | Admitting: Obstetrics and Gynecology

## 2016-06-27 DIAGNOSIS — Z01812 Encounter for preprocedural laboratory examination: Secondary | ICD-10-CM | POA: Insufficient documentation

## 2016-06-27 LAB — TYPE AND SCREEN
ABO/RH(D): A NEG
Antibody Screen: NEGATIVE

## 2016-06-27 LAB — BASIC METABOLIC PANEL
ANION GAP: 6 (ref 5–15)
BUN: 15 mg/dL (ref 6–20)
CO2: 27 mmol/L (ref 22–32)
Calcium: 9 mg/dL (ref 8.9–10.3)
Chloride: 109 mmol/L (ref 101–111)
Creatinine, Ser: 1.05 mg/dL — ABNORMAL HIGH (ref 0.44–1.00)
GFR calc Af Amer: 56 mL/min — ABNORMAL LOW (ref >60)
GFR calc non Af Amer: 48 mL/min — ABNORMAL LOW (ref >60)
Glucose, Bld: 90 mg/dL (ref 65–99)
Potassium: 4.1 mmol/L (ref 3.5–5.1)
Sodium: 142 mmol/L (ref 135–145)

## 2016-06-27 LAB — CBC
HCT: 38.5 % (ref 35.0–47.0)
Hemoglobin: 13.1 g/dL (ref 12.0–16.0)
MCH: 31.2 pg (ref 26.0–34.0)
MCHC: 34.1 g/dL (ref 32.0–36.0)
MCV: 91.6 fL (ref 80.0–100.0)
Platelets: 128 10*3/uL — ABNORMAL LOW (ref 150–440)
RBC: 4.2 MIL/uL (ref 3.80–5.20)
RDW: 13.5 % (ref 11.5–14.5)
WBC: 3.4 10*3/uL — ABNORMAL LOW (ref 3.6–11.0)

## 2016-06-27 NOTE — Pre-Procedure Instructions (Signed)
  Component Name Value Ref Range  Vent Rate (bpm) 54   PR Interval (msec) 138   QRS Interval (msec) 88   QT Interval (msec) 508   QTc (msec) 481   Result Narrative  Sinus bradycardia Left axis deviation Low voltage QRS Nonspecific ST abnormality Abnormal ECG When compared with ECG of 23-Nov-2015 16:29, Nonspecific T wave abnormalities now evident in Lateral leads I reviewed and concur with this report. Electronically signed [id number] MD, KEN [id number]) on 03/05/2016 9:29:08 PM  Status Results Details

## 2016-06-27 NOTE — Patient Instructions (Signed)
  Your procedure is scheduled on: July 05, 2016 Report to Same Day Surgery 2nd floor medical mall To find out your arrival time please call [phone] between 1PM - 3PM on Thursday, October 12th  Remember: Instructions that are not followed completely may result in serious medical risk, up to and including death, or upon the discretion of your surgeon and anesthesiologist your surgery may need to be rescheduled.    _x___ 1. Do not eat food or drink liquids after midnight. No gum chewing or hard candies.     __x__ 2. No Alcohol for 24 hours before or after surgery.   __x__3. No Smoking for 24 prior to surgery.   ____  4. Bring all medications with you on the day of surgery if instructed.    __x__ 5. Notify your doctor if there is any change in your medical condition     (cold, fever, infections).     Do not wear jewelry, make-up, hairpins, clips or nail polish.  Do not wear lotions, powders, or perfumes. You may wear deodorant.  Do not shave 48 hours prior to surgery. Men may shave face and neck.  Do not bring valuables to the hospital.    [facility] is not responsible for any belongings or valuables.               Contacts, dentures or bridgework may not be worn into surgery.  Leave your suitcase in the car. After surgery it may be brought to your room.  For patients admitted to the hospital, discharge time is determined by your treatment team.   Patients discharged the day of surgery will not be allowed to drive home.    Please read over the following fact sheets that you were given:   [facility] Preparing for Surgery and or MRSA Information   ____ Take these medicines the morning of surgery with A SIP OF WATER:    1. NONE  2.  3.  4.  5.  6.  ____Fleets enema or Magnesium Citrate as directed.   _x___ Use CHG Soap or sage wipes as directed on instruction sheet   ____ Use inhalers on the day of surgery and bring to hospital day of surgery  ____ Stop metformin 2  days prior to surgery    ____ Take 1/2 of usual insulin dose the night before surgery and none on the morning of           surgery.   ___X_ Stop aspirin or coumadin, or plavix as instructed by your doctor.  x__ Stop Anti-inflammatories such as Advil, Aleve, Ibuprofen, Motrin, Naproxen,          Naprosyn, Goodies powders or aspirin products. Ok to take Tylenol.   ____ Stop supplements until after surgery.    ____ Bring C-Pap to the hospital.

## 2016-06-28 NOTE — Pre-Procedure Instructions (Signed)
Met B and CBC sent to Dr. Leafy Ro and Anesthesia for review.

## 2016-07-05 ENCOUNTER — Encounter: Payer: Self-pay | Admitting: *Deleted

## 2016-07-05 ENCOUNTER — Encounter
Admission: RE | Disposition: A | Discharge status: Home / Self Care | Payer: Self-pay | Source: Ambulatory Visit | Attending: Obstetrics and Gynecology

## 2016-07-05 ENCOUNTER — Ambulatory Visit: Payer: PPO | Admitting: Anesthesiology

## 2016-07-05 ENCOUNTER — Ambulatory Visit
Admission: RE | Admit: 2016-07-05 | Discharge: 2016-07-05 | Disposition: A | Discharge status: Home / Self Care | Payer: PPO | Source: Ambulatory Visit | Attending: Obstetrics and Gynecology | Admitting: Obstetrics and Gynecology

## 2016-07-05 DIAGNOSIS — I4891 Unspecified atrial fibrillation: Secondary | ICD-10-CM | POA: Diagnosis not present

## 2016-07-05 DIAGNOSIS — Z7982 Long term (current) use of aspirin: Secondary | ICD-10-CM | POA: Insufficient documentation

## 2016-07-05 DIAGNOSIS — N882 Stricture and stenosis of cervix uteri: Secondary | ICD-10-CM | POA: Insufficient documentation

## 2016-07-05 DIAGNOSIS — Z7901 Long term (current) use of anticoagulants: Secondary | ICD-10-CM | POA: Insufficient documentation

## 2016-07-05 DIAGNOSIS — I10 Essential (primary) hypertension: Secondary | ICD-10-CM | POA: Diagnosis not present

## 2016-07-05 DIAGNOSIS — Z79810 Long term (current) use of selective estrogen receptor modulators (SERMs): Secondary | ICD-10-CM | POA: Insufficient documentation

## 2016-07-05 DIAGNOSIS — N952 Postmenopausal atrophic vaginitis: Secondary | ICD-10-CM | POA: Diagnosis not present

## 2016-07-05 DIAGNOSIS — E538 Deficiency of other specified B group vitamins: Secondary | ICD-10-CM | POA: Diagnosis not present

## 2016-07-05 DIAGNOSIS — N895 Stricture and atresia of vagina: Secondary | ICD-10-CM | POA: Insufficient documentation

## 2016-07-05 DIAGNOSIS — Z96659 Presence of unspecified artificial knee joint: Secondary | ICD-10-CM | POA: Insufficient documentation

## 2016-07-05 DIAGNOSIS — R938 Abnormal findings on diagnostic imaging of other specified body structures: Secondary | ICD-10-CM | POA: Diagnosis not present

## 2016-07-05 DIAGNOSIS — Z419 Encounter for procedure for purposes other than remedying health state, unspecified: Secondary | ICD-10-CM

## 2016-07-05 DIAGNOSIS — N841 Polyp of cervix uteri: Secondary | ICD-10-CM | POA: Diagnosis not present

## 2016-07-05 DIAGNOSIS — J449 Chronic obstructive pulmonary disease, unspecified: Secondary | ICD-10-CM | POA: Insufficient documentation

## 2016-07-05 DIAGNOSIS — M810 Age-related osteoporosis without current pathological fracture: Secondary | ICD-10-CM | POA: Insufficient documentation

## 2016-07-05 DIAGNOSIS — Z79899 Other long term (current) drug therapy: Secondary | ICD-10-CM | POA: Diagnosis not present

## 2016-07-05 DIAGNOSIS — Z792 Long term (current) use of antibiotics: Secondary | ICD-10-CM | POA: Diagnosis not present

## 2016-07-05 DIAGNOSIS — N950 Postmenopausal bleeding: Secondary | ICD-10-CM | POA: Diagnosis not present

## 2016-07-05 DIAGNOSIS — D649 Anemia, unspecified: Secondary | ICD-10-CM | POA: Diagnosis not present

## 2016-07-05 DIAGNOSIS — Z853 Personal history of malignant neoplasm of breast: Secondary | ICD-10-CM | POA: Insufficient documentation

## 2016-07-05 HISTORY — PX: HYSTEROSCOPY WITH D & C: SHX1775

## 2016-07-05 LAB — CBC WITH DIFFERENTIAL/PLATELET
BASOS ABS: 0 10*3/uL (ref 0–0.1)
BASOS PCT: 1 %
EOS ABS: 0.1 10*3/uL (ref 0–0.7)
Eosinophils Relative: 3 %
HCT: 36.6 % (ref 35.0–47.0)
Hemoglobin: 12.7 g/dL (ref 12.0–16.0)
Lymphocytes Relative: 20 %
Lymphs Abs: 0.7 10*3/uL — ABNORMAL LOW (ref 1.0–3.6)
MCH: 31.6 pg (ref 26.0–34.0)
MCHC: 34.6 g/dL (ref 32.0–36.0)
MCV: 91.3 fL (ref 80.0–100.0)
MONO ABS: 0.3 10*3/uL (ref 0.2–0.9)
MONOS PCT: 9 %
Neutro Abs: 2.2 10*3/uL (ref 1.4–6.5)
Neutrophils Relative %: 67 %
Platelets: 122 10*3/uL — ABNORMAL LOW (ref 150–440)
RBC: 4.01 MIL/uL (ref 3.80–5.20)
RDW: 13.2 % (ref 11.5–14.5)
WBC: 3.3 10*3/uL — ABNORMAL LOW (ref 3.6–11.0)

## 2016-07-05 SURGERY — DILATATION AND CURETTAGE /HYSTEROSCOPY
Anesthesia: General

## 2016-07-05 MED ORDER — LACTATED RINGERS IV SOLN
INTRAVENOUS | Status: DC
  Administered 2016-07-05: 09:00:00 via INTRAVENOUS

## 2016-07-05 MED ORDER — LIDOCAINE HCL (PF) 2 % IJ SOLN
INTRAMUSCULAR | Status: DC | PRN
  Administered 2016-07-05: 40 mg

## 2016-07-05 MED ORDER — PROPOFOL 10 MG/ML IV BOLUS
INTRAVENOUS | Status: DC | PRN
  Administered 2016-07-05 (×3): 20 mg via INTRAVENOUS
  Administered 2016-07-05: 10 mg via INTRAVENOUS

## 2016-07-05 MED ORDER — ACETAMINOPHEN 10 MG/ML IV SOLN
INTRAVENOUS | Status: DC | PRN
  Administered 2016-07-05: 1000 mg via INTRAVENOUS

## 2016-07-05 MED ORDER — ONDANSETRON 4 MG PO TBDP: 4.0000 mg | ORAL_TABLET | Freq: Three times a day (TID) | ORAL | 0 refills | Status: DC | PRN

## 2016-07-05 MED ORDER — ONDANSETRON HCL 4 MG/2ML IJ SOLN
INTRAMUSCULAR | Status: DC | PRN
  Administered 2016-07-05: 4 mg via INTRAVENOUS

## 2016-07-05 MED ORDER — EPHEDRINE SULFATE 50 MG/ML IJ SOLN
INTRAMUSCULAR | Status: DC | PRN
  Administered 2016-07-05: 5 mg via INTRAVENOUS

## 2016-07-05 MED ORDER — DOCUSATE SODIUM 100 MG PO CAPS: 100.0000 mg | ORAL_CAPSULE | Freq: Every day | ORAL | 3 refills | Status: DC | PRN

## 2016-07-05 MED ORDER — IBUPROFEN 800 MG PO TABS: 800.0000 mg | ORAL_TABLET | Freq: Three times a day (TID) | ORAL | 0 refills | Status: AC | PRN

## 2016-07-05 MED ORDER — FAMOTIDINE 20 MG PO TABS
ORAL_TABLET | ORAL | Status: AC
  Administered 2016-07-05: 20 mg via ORAL
  Filled 2016-07-05: qty 1

## 2016-07-05 MED ORDER — FAMOTIDINE 20 MG PO TABS
20.0000 mg | ORAL_TABLET | Freq: Once | ORAL | Status: AC
  Administered 2016-07-05: 20 mg via ORAL

## 2016-07-05 MED ORDER — FENTANYL CITRATE (PF) 100 MCG/2ML IJ SOLN: 25.0000 ug | INTRAMUSCULAR | Status: DC | PRN

## 2016-07-05 MED ORDER — PROPOFOL 500 MG/50ML IV EMUL
INTRAVENOUS | Status: DC | PRN
  Administered 2016-07-05: 50 ug/kg/min via INTRAVENOUS

## 2016-07-05 MED ORDER — LIDOCAINE-EPINEPHRINE (PF) 1 %-1:200000 IJ SOLN
INTRAMUSCULAR | Status: AC
  Filled 2016-07-05: qty 30

## 2016-07-05 MED ORDER — MIDAZOLAM HCL 5 MG/5ML IJ SOLN
INTRAMUSCULAR | Status: DC | PRN
  Administered 2016-07-05: 1 mg via INTRAVENOUS

## 2016-07-05 MED ORDER — LIDOCAINE-EPINEPHRINE (PF) 1 %-1:200000 IJ SOLN
INTRAMUSCULAR | Status: DC | PRN
  Administered 2016-07-05: 16 mL

## 2016-07-05 MED ORDER — DEXAMETHASONE SODIUM PHOSPHATE 10 MG/ML IJ SOLN
INTRAMUSCULAR | Status: DC | PRN
  Administered 2016-07-05: 6 mg via INTRAVENOUS

## 2016-07-05 SURGICAL SUPPLY — 22 items
CANISTER SUCT 3000ML (MISCELLANEOUS) ×3 IMPLANT
CATH ROBINSON RED A/P 16FR (CATHETERS) ×3 IMPLANT
CORD URO TURP 10FT (MISCELLANEOUS) ×3 IMPLANT
CUP MEDICINE 2OZ PLAST GRAD ST (MISCELLANEOUS) ×3 IMPLANT
ELECT LOOP MED HF 24F 12D (CUTTING LOOP) ×3 IMPLANT
ELECT REM PT RETURN 9FT ADLT (ELECTROSURGICAL) ×3
ELECT RESECT POWERBALL 24F (MISCELLANEOUS) ×3 IMPLANT
ELECTRODE REM PT RTRN 9FT ADLT (ELECTROSURGICAL) ×1 IMPLANT
GLOVE BIO SURGEON STRL SZ 6.5 (GLOVE) ×2 IMPLANT
GLOVE BIO SURGEONS STRL SZ 6.5 (GLOVE) ×1
GLOVE INDICATOR 7.0 STRL GRN (GLOVE) ×3 IMPLANT
GOWN STRL REUS W/ TWL LRG LVL3 (GOWN DISPOSABLE) ×2 IMPLANT
GOWN STRL REUS W/TWL LRG LVL3 (GOWN DISPOSABLE) ×4
IV LACTATED RINGERS 1000ML (IV SOLUTION) ×3 IMPLANT
KIT RM TURNOVER CYSTO AR (KITS) ×3 IMPLANT
NEEDLE SPNL 22GX3.5 QUINCKE BK (NEEDLE) ×3 IMPLANT
PACK DNC HYST (MISCELLANEOUS) ×3 IMPLANT
PAD OB MATERNITY 4.3X12.25 (PERSONAL CARE ITEMS) ×3 IMPLANT
PAD PREP 24X41 OB/GYN DISP (PERSONAL CARE ITEMS) ×3 IMPLANT
SYR CONTROL 10ML (SYRINGE) ×3 IMPLANT
TUBING CONNECTING 10 (TUBING) ×2 IMPLANT
TUBING CONNECTING 10' (TUBING) ×1

## 2016-07-05 NOTE — Interval H&P Note (Signed)
History and Physical Interval Note:  07/05/2016 8:37 AM  Kelly Bishop  has presented today for surgery, with the diagnosis of PMB  Thickened Endometrium  The various methods of treatment have been discussed with the patient and family. After consideration of risks, benefits and other options for treatment, the patient has consented to  Procedure(s): DILATATION AND CURETTAGE /HYSTEROSCOPY (N/A) and exam under anesthesia as a surgical intervention .  The patient's history has been reviewed, patient examined, no change in status, stable for surgery.  I have reviewed the patient's chart and labs.  Questions were answered to the patient's satisfaction.     [name]

## 2016-07-05 NOTE — Progress Notes (Signed)
Awake. Denies pain. Refuses po fluids. 

## 2016-07-05 NOTE — Transfer of Care (Signed)
Immediate Anesthesia Transfer of Care Note  Patient: Kelly Bishop  Procedure(s) Performed: Procedure(s): DILATATION AND CURETTAGE /HYSTEROSCOPY (N/A)  Patient Location: PACU  Anesthesia Type:General  Level of Consciousness: awake, alert  and oriented  Airway & Oxygen Therapy: Patient Spontanous Breathing  Post-op Assessment: Report given to RN and Post -op Vital signs reviewed and stable  Post vital signs: Reviewed and stable  Last Vitals:  Vitals:   07/05/16 0819  BP: (!) 186/85  Pulse: 73  Resp: 18  Temp: 36.8 C    Last Pain:  Vitals:   07/05/16 0819  TempSrc: Tympanic         Complications: No apparent anesthesia complications

## 2016-07-05 NOTE — Op Note (Addendum)
Operative Report Hysteroscopy with Dilation and Curettage   Indications: Postmenopausal bleeding   Pre-operative Diagnosis: Uterine atrophy, vaginal stenosis  Post-operative Diagnosis: same.  Procedure: 1. Exam under anesthesia 2. Fractional D&C 3. Hysteroscopy  Surgeon: [name], MD  Assistant(s):  None  Anesthesia: IV anesthesia with native airway  Anesthesiologist: Andria Frames, MD Anesthesiologist: Andria Frames, MD CRNA: Letitia Neri, CRNA  Estimated Blood Loss:  10 mL   Total IV Fluids: 229ml  Urine Output: 21ml         Specimens: Endocervical curettings, endometrial curettings         Complications:  None; patient tolerated the procedure well.         Disposition: PACU - hemodynamically stable.         Condition: stable  Findings: Uterus measuring 9.5 cm by sound; normal cervix, vagina, perineum. Atrophic endometrium. Atrophic and stenotic introitus and vaginal lining.   Indication for procedure/Consents: 82 y.o.here for scheduled surgery for the aforementioned diagnoses.  Risks of surgery were discussed with the patient including but not limited to: bleeding which may require transfusion; infection which may require antibiotics; injury to uterus or surrounding organs; intrauterine scarring which may impair future fertility; need for additional procedures including laparotomy or laparoscopy; and other postoperative/anesthesia complications. Written informed consent was obtained.    Procedure Details:  Fractional D&C only  The patient was taken to the operating room where anesthesia was administered and was found to be adequate. After a formal and adequate timeout was performed, she was placed in the dorsal lithotomy position and examined with the above findings. She was then prepped and draped in the sterile manner. Her bladder was catheterized for an estimated amount of clear, yellow urine. A weighed speculum was then placed in the  patient's vagina and a single tooth tenaculum was applied to the anterior lip of the cervix.  Her cervix was serially dilated to 15 [place] using Hanks dilators. An ECC was performed. The hysteroscope was introduced to reveal the above findings. A sharp curettage was then performed until there was a gritty texture in all four quadrants. The tenaculum was removed from the anterior lip of the cervix and the vaginal speculum was removed after applying silver nitrate for good hemostasis.   The patient tolerated the procedure well and was taken to the recovery area awake and in stable condition. She received iv acetaminophen and Toradol prior to leaving the OR.  The patient will be discharged to home as per PACU criteria. Routine postoperative instructions given. She was prescribed Ibuprofen and Colace. She will follow up in the clinic in two weeks for postoperative evaluation.

## 2016-07-05 NOTE — Anesthesia Postprocedure Evaluation (Signed)
Anesthesia Post Note  Patient: Kelly Bishop  Procedure(s) Performed: Procedure(s) (LRB): DILATATION AND CURETTAGE /HYSTEROSCOPY (N/A)  Patient location during evaluation: PACU Anesthesia Type: General Level of consciousness: awake and alert Pain management: pain level controlled Vital Signs Assessment: post-procedure vital signs reviewed and stable Respiratory status: spontaneous breathing, nonlabored ventilation, respiratory function stable and patient connected to nasal cannula oxygen Cardiovascular status: blood pressure returned to baseline and stable Postop Assessment: no signs of nausea or vomiting Anesthetic complications: no    Last Vitals:  Vitals:   07/05/16 1043 07/05/16 1101  BP: (!) 169/65 139/63  Pulse: 77 65  Resp: 16 16  Temp: 36.4 C     Last Pain:  Vitals:   07/05/16 1101  TempSrc:   PainSc: 1                  Precious Haws Hadley Detloff

## 2016-07-05 NOTE — Discharge Instructions (Signed)
Discharge instructions after a hysteroscopy with dilation and curettage  Signs and Symptoms to Report  Call our office at [phone] if you have any of the following:    Fever over 100.4 degrees or higher  Severe stomach pain not relieved with pain medications  Bright red bleeding thats heavier than a period that does not slow with rest after the first 24 hours  To go the bathroom a lot (frequency), you cant hold your urine (urgency), or it hurts when you empty your bladder (urinate)  Chest pain  Shortness of breath  Pain in the calves of your legs  Severe nausea and vomiting not relieved with anti-nausea medications  Any concerns  What You Can Expect after Surgery  You may see some pink tinged, bloody fluid. This is normal. You may also have cramping for several days.   Activities after Your Discharge Follow these guidelines to help speed your recovery at home:  Dont drive if you are in pain or taking narcotic pain medicine. You may drive when you can safely slam on the brakes, turn the wheel forcefully, and rotate your torso comfortably. This is typically 4-7 days. Practice in a parking lot or side street prior to attempting to drive regularly.   Ask others to help with household chores for 4 weeks.  Dont do strenuous activities, exercises, or sports like vacuuming, tennis, squash, etc. until your doctor says it is safe to do so.  Walk as you feel able. Rest often since it may take a week or two for your energy level to return to normal.   You may climb stairs  Avoid constipation:   -Eat fruits, vegetables, and whole grains. Eat small meals as your appetite will take time to return to normal.   -Drink 6 to 8 glasses of water each day unless your doctor has told you to limit your fluids.   -Use a laxative or stool softener as needed if constipation becomes a problem. You may take Miralax, metamucil, Citrucil, Colace, Senekot, FiberCon, etc. If this does not  relieve the constipation, try two tablespoons of Milk Of Magnesia every 8 hours until your bowels move.   You may shower.   Do not get in a hot tub, swimming pool, etc. until your doctor agrees.  Do not douche, use tampons, or have sex until your doctor says it is okay, usually about 2 weeks.  Take your pain medicine when you need it. The medicine may not work as well if the pain is bad.  Take the medicines you were taking before surgery. Other medications you might need are pain medications (ibuprofen), medications for constipation (Colace) and nausea medications (Zofran).   General Anesthesia, Adult, Care After Refer to this sheet in the next few weeks. These instructions provide you with information on caring for yourself after your procedure. Your health care provider may also give you more specific instructions. Your treatment has been planned according to current medical practices, but problems sometimes occur. Call your health care provider if you have any problems or questions after your procedure. WHAT TO EXPECT AFTER THE PROCEDURE After the procedure, it is typical to experience:  Sleepiness.  Nausea and vomiting. HOME CARE INSTRUCTIONS  For the first 24 hours after general anesthesia:  Have a responsible person with you.  Do not drive a car. If you are alone, do not take public transportation.  Do not drink alcohol.  Do not take medicine that has not been prescribed by your health care  provider.  Do not sign important papers or make important decisions.  You may resume a normal diet and activities as directed by your health care provider.  Change bandages (dressings) as directed.  If you have questions or problems that seem related to general anesthesia, call the hospital and ask for the anesthetist or anesthesiologist on call. SEEK MEDICAL CARE IF:  You have nausea and vomiting that continue the day after anesthesia.  You develop a rash. SEEK IMMEDIATE MEDICAL  CARE IF:   You have difficulty breathing.  You have chest pain.  You have any allergic problems.   This information is not intended to replace advice given to you by your health care provider. Make sure you discuss any questions you have with your health care provider.   Document Released: 12/16/2000 Document Revised: 09/30/2014 Document Reviewed: 01/08/2012 Elsevier Interactive Patient Education [employer].

## 2016-07-05 NOTE — Anesthesia Preprocedure Evaluation (Signed)
Anesthesia Evaluation  Patient identified by MRN, date of birth, ID band Patient awake    Reviewed: Allergy & Precautions, H&P , NPO status , Patient's Chart, lab work & pertinent test results  History of Anesthesia Complications (+) PONV and history of anesthetic complications  Airway Mallampati: III  TM Distance: <3 FB Neck ROM: limited    Dental  (+) Poor Dentition, Missing, Implants   Pulmonary neg shortness of breath, COPD,    Pulmonary exam normal breath sounds clear to auscultation       Cardiovascular Exercise Tolerance: Good (-) Past MI Normal cardiovascular exam+ dysrhythmias Atrial Fibrillation  Rhythm:regular Rate:Normal     Neuro/Psych negative neurological ROS  negative psych ROS   GI/Hepatic negative GI ROS, Neg liver ROS, neg GERD  ,  Endo/Other  negative endocrine ROS  Renal/GU negative Renal ROS  negative genitourinary   Musculoskeletal  (+) Arthritis ,   Abdominal   Peds  Hematology negative hematology ROS (+)   Anesthesia Other Findings Past Medical History: No date: Anemia No date: Atrial fibrillation ([name]) No date: B12 deficiency No date: Breast cancer ([name]) No date: Breast cancer, right breast ([name])     Comment: right breast Invasive mammary carcinoma grade               1 No date: Cataracts, bilateral No date: COPD (chronic obstructive pulmonary disease) (* No date: Hemorrhoids No date: Lymphopenia No date: Osteoarthritis July 2015: Osteoporosis     Comment: seen on DEXA scan-T score of -2.9 in the left               neck femur No date: Pancytopenia ([name]) No date: Skin cancer 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 No date: Thrombocytopenia ([name]) No date: Unsteady gait No date: Zenker's diverticulum  Past Surgical History: No date: BREAST EXCISIONAL BIOPSY Left     Comment: x 20 years surgical bx 2015: BREAST EXCISIONAL BIOPSY Right     Comment: breast cancer  01/24/2016: BREAST  EXCISIONAL BIOPSY Left     Comment: ultrasound - pathology pending 03/12/2016: BREAST LUMPECTOMY WITH NEEDLE LOCALIZATION Left     Comment: Procedure: BREAST LUMPECTOMY WITH NEEDLE               LOCALIZATION;  Surgeon: Leonie Green,               MD;  Location: [facility] ORS;  Service: General;                Laterality: Left; May 2011: L1-L4 laminectomy and T6 fusion     Comment: complicated by postoperative infection, more               recently had recurrent pseudomonas infection at              scar site March 2015 No date: REPLACEMENT TOTAL KNEE Bilateral No date: ROTATOR CUFF REPAIR Bilateral Jan 27, 2014: ultrasound guided biopsy of breast  BMI    Body Mass Index:  26.57 kg/m      Reproductive/Obstetrics negative OB ROS                             Anesthesia Physical Anesthesia Plan  ASA: III  Anesthesia Plan: General   Post-op Pain Management:    Induction:   Airway Management Planned:   Additional Equipment:   Intra-op Plan:   Post-operative Plan:   Informed Consent: I have reviewed the patients History and Physical, chart,  labs and discussed the procedure including the risks, benefits and alternatives for the proposed anesthesia with the patient or authorized representative who has indicated his/her understanding and acceptance.   Dental Advisory Given  Plan Discussed with: Anesthesiologist, CRNA and Surgeon  Anesthesia Plan Comments: (Plan for TIVA with paracervical block and narcotic avoidance )        Anesthesia Quick Evaluation

## 2016-07-08 LAB — SURGICAL PATHOLOGY

## 2016-07-11 ENCOUNTER — Ambulatory Visit: Payer: PPO | Admitting: Internal Medicine

## 2016-07-11 DIAGNOSIS — I4891 Unspecified atrial fibrillation: Secondary | ICD-10-CM | POA: Diagnosis not present

## 2016-07-11 DIAGNOSIS — I10 Essential (primary) hypertension: Secondary | ICD-10-CM | POA: Diagnosis not present

## 2016-07-11 DIAGNOSIS — E782 Mixed hyperlipidemia: Secondary | ICD-10-CM | POA: Diagnosis not present

## 2016-07-19 ENCOUNTER — Other Ambulatory Visit: Payer: PPO

## 2016-07-19 ENCOUNTER — Ambulatory Visit: Payer: PPO | Admitting: Internal Medicine

## 2016-07-22 ENCOUNTER — Inpatient Hospital Stay: Payer: PPO | Attending: Internal Medicine | Admitting: Internal Medicine

## 2016-07-22 ENCOUNTER — Inpatient Hospital Stay: Payer: PPO

## 2016-07-22 VITALS — BP 122/75 | HR 61 | Temp 97.7°F | Resp 18 | Wt 150.6 lb

## 2016-07-22 DIAGNOSIS — E538 Deficiency of other specified B group vitamins: Secondary | ICD-10-CM

## 2016-07-22 DIAGNOSIS — M1990 Unspecified osteoarthritis, unspecified site: Secondary | ICD-10-CM | POA: Diagnosis not present

## 2016-07-22 DIAGNOSIS — Z79811 Long term (current) use of aromatase inhibitors: Secondary | ICD-10-CM | POA: Diagnosis not present

## 2016-07-22 DIAGNOSIS — Z803 Family history of malignant neoplasm of breast: Secondary | ICD-10-CM | POA: Insufficient documentation

## 2016-07-22 DIAGNOSIS — D242 Benign neoplasm of left breast: Secondary | ICD-10-CM | POA: Insufficient documentation

## 2016-07-22 DIAGNOSIS — M810 Age-related osteoporosis without current pathological fracture: Secondary | ICD-10-CM | POA: Diagnosis not present

## 2016-07-22 DIAGNOSIS — Z801 Family history of malignant neoplasm of trachea, bronchus and lung: Secondary | ICD-10-CM | POA: Diagnosis not present

## 2016-07-22 DIAGNOSIS — C50811 Malignant neoplasm of overlapping sites of right female breast: Secondary | ICD-10-CM | POA: Diagnosis not present

## 2016-07-22 DIAGNOSIS — I4891 Unspecified atrial fibrillation: Secondary | ICD-10-CM | POA: Insufficient documentation

## 2016-07-22 DIAGNOSIS — Z800 Family history of malignant neoplasm of digestive organs: Secondary | ICD-10-CM | POA: Diagnosis not present

## 2016-07-22 DIAGNOSIS — Z85828 Personal history of other malignant neoplasm of skin: Secondary | ICD-10-CM

## 2016-07-22 DIAGNOSIS — Z923 Personal history of irradiation: Secondary | ICD-10-CM | POA: Diagnosis not present

## 2016-07-22 DIAGNOSIS — Z7982 Long term (current) use of aspirin: Secondary | ICD-10-CM | POA: Insufficient documentation

## 2016-07-22 DIAGNOSIS — Z79810 Long term (current) use of selective estrogen receptor modulators (SERMs): Secondary | ICD-10-CM | POA: Diagnosis not present

## 2016-07-22 DIAGNOSIS — Z8042 Family history of malignant neoplasm of prostate: Secondary | ICD-10-CM | POA: Insufficient documentation

## 2016-07-22 DIAGNOSIS — Z79899 Other long term (current) drug therapy: Secondary | ICD-10-CM | POA: Diagnosis not present

## 2016-07-22 DIAGNOSIS — N898 Other specified noninflammatory disorders of vagina: Secondary | ICD-10-CM | POA: Diagnosis not present

## 2016-07-22 DIAGNOSIS — Z806 Family history of leukemia: Secondary | ICD-10-CM | POA: Insufficient documentation

## 2016-07-22 DIAGNOSIS — Z7901 Long term (current) use of anticoagulants: Secondary | ICD-10-CM

## 2016-07-22 DIAGNOSIS — J449 Chronic obstructive pulmonary disease, unspecified: Secondary | ICD-10-CM | POA: Diagnosis not present

## 2016-07-22 DIAGNOSIS — Z170 Estrogen receptor positive status [ER+]: Secondary | ICD-10-CM | POA: Insufficient documentation

## 2016-07-22 DIAGNOSIS — I482 Chronic atrial fibrillation: Secondary | ICD-10-CM | POA: Diagnosis not present

## 2016-07-22 MED ORDER — ANASTROZOLE 1 MG PO TABS: 1.0000 mg | ORAL_TABLET | Freq: Every day | ORAL | 11 refills | Status: DC

## 2016-07-22 NOTE — Assessment & Plan Note (Signed)
#  Right-sided breast cancer stage I ER/PR positive HER-2/neu negative. Patient is currently on adjuvant tamoxifen [given history of osteoporosis]. Clinically no evidence of recurrence noted.  # However, given Vaginal Discharge/ A.fib needing- amiodarone; STOP Tamoxifen; recommend Arimidex; no interaction with Amio. Discussed potential AEs.   # A.fib- on eliquis; interested in Amio.  # Vaginal discharge s/p D&C- NO evidence of malignancy [dr.Beasley]  # Follow up in  8/ BMD prior.  No labs.

## 2016-07-22 NOTE — Progress Notes (Signed)
Patient is here for follow up, She does have Afib and would like to see about getting back on amiodarone for palpitations

## 2016-07-22 NOTE — Progress Notes (Signed)
[name] OFFICE PROGRESS NOTE  Patient Care Team: Kelly Aus, MD as PCP - General (Internal Medicine)   SUMMARY OF ONCOLOGIC HISTORY:  Oncology History   # 2015- RIGHT BREAST CA STAGE I [T1aN0]ER/PR- POS; Her-2 NEU-NEG; s/p Lumpec & RT [Dr.Crystal] on tamoxifen [given osteoporosis]; NOV 2017- Start Arimidex  # Left breast papilloma-June 2017  # A.fib on anti-coag; [stop Tam sec to DI with Amiodarone]   # Osteoporosis; pct 2017-Vaginal bleeding/  D&C- Dr.Beasley- NEG     Carcinoma of overlapping sites of right breast in female, estrogen receptor positive ([place])     INTERVAL HISTORY:  A very pleasant 82 year old female patient with above history of stage I breast cancer of the right side currently on adjuvant tamoxifen/given history of osteoporosis is here for follow-up. Currently off tamoxifen Secondary to vaginal discharge.  The interim patient underwent D&C. Patient has not started back on tamoxifen yet. She is interested in going back on amiodarone for A. Fib. Concerned about interactions with tamoxifen.  Chronic mild arthritis. Not any worse.  REVIEW OF SYSTEMS:  A complete 10 point review of system is done which is negative except mentioned above/history of present illness.   PAST MEDICAL HISTORY :  Past Medical History:  Diagnosis Date  . Anemia   . Atrial fibrillation ([name])   . B12 deficiency   . Breast cancer ([name])   . Breast cancer, right breast ([name])    right breast Invasive mammary carcinoma grade 1  . Cataracts, bilateral   . COPD (chronic obstructive pulmonary disease) ([place])   . Hemorrhoids   . Lymphopenia   . Osteoarthritis   . Osteoporosis July 2015   seen on DEXA scan-T score of -2.9 in the left neck femur  . Pancytopenia ([place])   . Skin cancer   . Spastic dysphonia   . Thrombocytopenia ([place])   . Unsteady gait   . Zenker's diverticulum     PAST SURGICAL HISTORY :   Past Surgical History:  Procedure Laterality Date  . BREAST  EXCISIONAL BIOPSY Left    x 20 years surgical bx  . BREAST EXCISIONAL BIOPSY Right 2015   breast cancer   . BREAST EXCISIONAL BIOPSY Left 01/24/2016   ultrasound - pathology pending  . BREAST LUMPECTOMY WITH NEEDLE LOCALIZATION Left 03/12/2016   Procedure: BREAST LUMPECTOMY WITH NEEDLE LOCALIZATION;  Surgeon: Kelly Green, MD;  Location: [facility] ORS;  Service: General;  Laterality: Left;  . HYSTEROSCOPY W/D&C N/A 07/05/2016   Procedure: DILATATION AND CURETTAGE /HYSTEROSCOPY;  Surgeon: Kelly Kindler, MD;  Location: [facility] ORS;  Service: Gynecology;  Laterality: N/A;  . L1-L4 laminectomy and T6 fusion  May 3662   complicated by postoperative infection, more recently had recurrent pseudomonas infection at scar site March 2015  . REPLACEMENT TOTAL KNEE Bilateral   . ROTATOR CUFF REPAIR Bilateral   . ultrasound guided biopsy of breast  Jan 27, 2014    FAMILY HISTORY :   Family History  Problem Relation Age of Onset  . Throat cancer Mother   . Prostate cancer Brother   . Breast cancer Brother   . Leukemia Brother   . Lung cancer Father     SOCIAL HISTORY:   Social History  Substance Use Topics  . Smoking status: Never Smoker  . Smokeless tobacco: Never Used  . Alcohol use No    ALLERGIES:  is allergic to ace inhibitors; beta adrenergic blockers; morphine and related; peanut-containing drug products; and codeine.  MEDICATIONS:  Current  Outpatient Prescriptions  Medication Sig Dispense Refill  . ALPRAZolam (XANAX) 0.5 MG tablet Take 0.25 mg by mouth at bedtime.    Marland Kitchen aspirin 81 MG chewable tablet Chew 81 mg by mouth daily.     . bisacodyl (DULCOLAX) 5 MG EC tablet Take 10 mg by mouth at bedtime.     . Cholecalciferol 2000 units CAPS Take 1 capsule by mouth daily.    . Cyanocobalamin (RA VITAMIN B-12 TR) 1000 MCG TBCR Take 1,000 mcg by mouth daily.     Marland Kitchen diltiazem (TAZTIA XT) 120 MG 24 hr capsule Take 120 mg by mouth at bedtime.     . docusate sodium (COLACE) 100 MG capsule Take  1 capsule (100 mg total) by mouth daily as needed for mild constipation. 60 capsule 3  . fexofenadine (ALLEGRA) 180 MG tablet Take 180 mg by mouth daily.    . furosemide (LASIX) 40 MG tablet Take 40 mg by mouth daily as needed.     . potassium chloride (K-DUR) 10 MEQ tablet Take 10 mEq by mouth daily as needed.    . warfarin (COUMADIN) 5 MG tablet Take 5 mg by mouth daily at 6 PM. Alternates with 38m tablet i.e. 6,5,6,5,6,5...    . warfarin (COUMADIN) 6 MG tablet Take 6 mg by mouth daily at 6 PM. Alternates with 591mtablet i.e. 5,6,5,6,5,6...    . anastrozole (ARIMIDEX) 1 MG tablet Take 1 tablet (1 mg total) by mouth daily. 30 tablet 11  . ciprofloxacin (CIPRO) 500 MG tablet Take 500 mg by mouth 2 (two) times daily.     . ondansetron (ZOFRAN ODT) 4 MG disintegrating tablet Take 1 tablet (4 mg total) by mouth every 8 (eight) hours as needed for nausea or vomiting. (Patient not taking: Reported on 07/22/2016) 20 tablet 0   No current facility-administered medications for this visit.     PHYSICAL EXAMINATION: ECOG PERFORMANCE STATUS: 0 - Asymptomatic  BP 122/75 (BP Location: Left Arm, Patient Position: Sitting)   Pulse 61   Temp 97.7 F (36.5 C) (Tympanic)   Resp 18   Wt 150 lb 9.6 oz (68.3 kg)   BMI 26.68 kg/m   Filed Weights   07/22/16 1442  Weight: 150 lb 9.6 oz (68.3 kg)    GENERAL: Well-nourished well-developed; Alert, no distress and comfortable.  She is alone. She needs help getting on the exam table.She walks with a cane. EYES: no pallor or icterus OROPHARYNX: no thrush or ulceration; good dentition  NECK: supple, no masses felt LYMPH:  no palpable lymphadenopathy in the cervical, axillary or inguinal regions LUNGS: clear to auscultation and  No wheeze or crackles HEART/CVS: regular rate & rhythm and no murmurs; No lower extremity edema ABDOMEN:abdomen soft, non-tender and normal bowel sounds Musculoskeletal:no cyanosis of digits and no clubbing  PSYCH: alert & oriented x  3 with fluent speech NEURO: no focal motor/sensory deficits SKIN:  Multiple ecchymosis from Coumadin.    LABORATORY DATA:  I have reviewed the data as listed    Component Value Date/Time   NA 142 06/27/2016 1332   NA 147 (H) 08/14/2014 0416   K 4.1 06/27/2016 1332   K 3.9 08/14/2014 0416   CL 109 06/27/2016 1332   CL 114 (H) 08/14/2014 0416   CO2 27 06/27/2016 1332   CO2 25 08/14/2014 0416   GLUCOSE 90 06/27/2016 1332   GLUCOSE 91 08/14/2014 0416   BUN 15 06/27/2016 1332   BUN 9 08/14/2014 0416   CREATININE 1.05 (H) 06/27/2016  1332   CREATININE 0.90 08/14/2014 0416   CALCIUM 9.0 06/27/2016 1332   CALCIUM 8.0 (L) 08/14/2014 0416   PROT 6.2 (L) 03/04/2016 1331   PROT 6.9 08/13/2014 2058   ALBUMIN 3.7 03/04/2016 1331   ALBUMIN 3.2 (L) 08/13/2014 2058   AST 26 03/04/2016 1331   AST 32 08/13/2014 2058   ALT 16 03/04/2016 1331   ALT 22 08/13/2014 2058   ALKPHOS 29 (L) 03/04/2016 1331   ALKPHOS 50 08/13/2014 2058   BILITOT 0.7 03/04/2016 1331   BILITOT 0.4 08/13/2014 2058   GFRNONAA 48 (L) 06/27/2016 1332   GFRNONAA >60 08/14/2014 0416   GFRNONAA 42 (L) 05/03/2014 1105   GFRAA 56 (L) 06/27/2016 1332   GFRAA >60 08/14/2014 0416   GFRAA 49 (L) 05/03/2014 1105    No results found for: SPEP, UPEP  Lab Results  Component Value Date   WBC 3.3 (L) 07/05/2016   NEUTROABS 2.2 07/05/2016   HGB 12.7 07/05/2016   HCT 36.6 07/05/2016   MCV 91.3 07/05/2016   PLT 122 (L) 07/05/2016      Chemistry      Component Value Date/Time   NA 142 06/27/2016 1332   NA 147 (H) 08/14/2014 0416   K 4.1 06/27/2016 1332   K 3.9 08/14/2014 0416   CL 109 06/27/2016 1332   CL 114 (H) 08/14/2014 0416   CO2 27 06/27/2016 1332   CO2 25 08/14/2014 0416   BUN 15 06/27/2016 1332   BUN 9 08/14/2014 0416   CREATININE 1.05 (H) 06/27/2016 1332   CREATININE 0.90 08/14/2014 0416      Component Value Date/Time   CALCIUM 9.0 06/27/2016 1332   CALCIUM 8.0 (L) 08/14/2014 0416   ALKPHOS 29 (L)  03/04/2016 1331   ALKPHOS 50 08/13/2014 2058   AST 26 03/04/2016 1331   AST 32 08/13/2014 2058   ALT 16 03/04/2016 1331   ALT 22 08/13/2014 2058   BILITOT 0.7 03/04/2016 1331   BILITOT 0.4 08/13/2014 2058         ASSESSMENT & PLAN:   Carcinoma of overlapping sites of right breast in female, estrogen receptor positive ([place]) # Right-sided breast cancer stage I ER/PR positive HER-2/neu negative. Patient is currently on adjuvant tamoxifen [given history of osteoporosis]. Clinically no evidence of recurrence noted.  # However, given Vaginal Discharge/ A.fib needing- amiodarone; STOP Tamoxifen; recommend Arimidex; no interaction with Amio. Discussed potential AEs.   # A.fib- on eliquis; interested in Amio.  # Vaginal discharge s/p D&C- NO evidence of malignancy [dr.Beasley]  # Follow up in  8/ BMD prior.  No labs.     Cammie Sickle, MD 07/24/2016 7:59 AM

## 2016-08-13 DIAGNOSIS — I482 Chronic atrial fibrillation: Secondary | ICD-10-CM | POA: Diagnosis not present

## 2016-08-22 ENCOUNTER — Ambulatory Visit
Admission: RE | Admit: 2016-08-22 | Discharge: 2016-08-22 | Disposition: A | Discharge status: Home / Self Care | Payer: PPO | Source: Ambulatory Visit | Attending: Internal Medicine | Admitting: Internal Medicine

## 2016-08-22 DIAGNOSIS — C50811 Malignant neoplasm of overlapping sites of right female breast: Secondary | ICD-10-CM | POA: Diagnosis not present

## 2016-08-22 DIAGNOSIS — Z780 Asymptomatic menopausal state: Secondary | ICD-10-CM | POA: Diagnosis not present

## 2016-08-22 DIAGNOSIS — M810 Age-related osteoporosis without current pathological fracture: Secondary | ICD-10-CM | POA: Diagnosis not present

## 2016-08-22 DIAGNOSIS — Z170 Estrogen receptor positive status [ER+]: Secondary | ICD-10-CM | POA: Diagnosis not present

## 2016-09-12 DIAGNOSIS — I482 Chronic atrial fibrillation: Secondary | ICD-10-CM | POA: Diagnosis not present

## 2016-09-17 DIAGNOSIS — I482 Chronic atrial fibrillation: Secondary | ICD-10-CM | POA: Diagnosis not present

## 2016-09-17 DIAGNOSIS — M5010 Cervical disc disorder with radiculopathy, unspecified cervical region: Secondary | ICD-10-CM | POA: Diagnosis not present

## 2016-09-20 ENCOUNTER — Inpatient Hospital Stay: Payer: PPO

## 2016-09-27 ENCOUNTER — Ambulatory Visit: Payer: PPO | Admitting: Oncology

## 2016-10-02 DIAGNOSIS — M545 Low back pain: Secondary | ICD-10-CM | POA: Diagnosis not present

## 2016-10-02 DIAGNOSIS — I4891 Unspecified atrial fibrillation: Secondary | ICD-10-CM | POA: Diagnosis not present

## 2016-10-02 DIAGNOSIS — S3992XA Unspecified injury of lower back, initial encounter: Secondary | ICD-10-CM | POA: Diagnosis not present

## 2016-10-02 DIAGNOSIS — M546 Pain in thoracic spine: Secondary | ICD-10-CM | POA: Diagnosis not present

## 2016-10-02 DIAGNOSIS — S299XXA Unspecified injury of thorax, initial encounter: Secondary | ICD-10-CM | POA: Diagnosis not present

## 2016-10-02 DIAGNOSIS — E782 Mixed hyperlipidemia: Secondary | ICD-10-CM | POA: Diagnosis not present

## 2016-10-02 DIAGNOSIS — I10 Essential (primary) hypertension: Secondary | ICD-10-CM | POA: Diagnosis not present

## 2016-10-15 DIAGNOSIS — I482 Chronic atrial fibrillation: Secondary | ICD-10-CM | POA: Diagnosis not present

## 2016-10-21 ENCOUNTER — Inpatient Hospital Stay: Payer: PPO | Attending: Internal Medicine | Admitting: Internal Medicine

## 2016-10-21 VITALS — BP 164/73 | HR 58 | Temp 96.9°F | Wt 150.0 lb

## 2016-10-21 DIAGNOSIS — Z7901 Long term (current) use of anticoagulants: Secondary | ICD-10-CM

## 2016-10-21 DIAGNOSIS — M1990 Unspecified osteoarthritis, unspecified site: Secondary | ICD-10-CM | POA: Diagnosis not present

## 2016-10-21 DIAGNOSIS — C50811 Malignant neoplasm of overlapping sites of right female breast: Secondary | ICD-10-CM | POA: Diagnosis not present

## 2016-10-21 DIAGNOSIS — Z923 Personal history of irradiation: Secondary | ICD-10-CM | POA: Diagnosis not present

## 2016-10-21 DIAGNOSIS — E538 Deficiency of other specified B group vitamins: Secondary | ICD-10-CM

## 2016-10-21 DIAGNOSIS — Z79899 Other long term (current) drug therapy: Secondary | ICD-10-CM | POA: Diagnosis not present

## 2016-10-21 DIAGNOSIS — Z803 Family history of malignant neoplasm of breast: Secondary | ICD-10-CM

## 2016-10-21 DIAGNOSIS — Z885 Allergy status to narcotic agent status: Secondary | ICD-10-CM | POA: Diagnosis not present

## 2016-10-21 DIAGNOSIS — I4891 Unspecified atrial fibrillation: Secondary | ICD-10-CM | POA: Diagnosis not present

## 2016-10-21 DIAGNOSIS — Z85828 Personal history of other malignant neoplasm of skin: Secondary | ICD-10-CM

## 2016-10-21 DIAGNOSIS — M810 Age-related osteoporosis without current pathological fracture: Secondary | ICD-10-CM | POA: Diagnosis not present

## 2016-10-21 DIAGNOSIS — Z801 Family history of malignant neoplasm of trachea, bronchus and lung: Secondary | ICD-10-CM | POA: Diagnosis not present

## 2016-10-21 DIAGNOSIS — J449 Chronic obstructive pulmonary disease, unspecified: Secondary | ICD-10-CM

## 2016-10-21 DIAGNOSIS — Z8042 Family history of malignant neoplasm of prostate: Secondary | ICD-10-CM

## 2016-10-21 DIAGNOSIS — Z9223 Personal history of estrogen therapy: Secondary | ICD-10-CM

## 2016-10-21 DIAGNOSIS — M818 Other osteoporosis without current pathological fracture: Secondary | ICD-10-CM

## 2016-10-21 DIAGNOSIS — Z170 Estrogen receptor positive status [ER+]: Secondary | ICD-10-CM | POA: Diagnosis not present

## 2016-10-21 DIAGNOSIS — Z7982 Long term (current) use of aspirin: Secondary | ICD-10-CM | POA: Diagnosis not present

## 2016-10-21 DIAGNOSIS — D242 Benign neoplasm of left breast: Secondary | ICD-10-CM

## 2016-10-21 NOTE — Progress Notes (Signed)
[facility] OFFICE PROGRESS NOTE  Patient Care Team: [name], MD as PCP - General (Internal Medicine)   SUMMARY OF ONCOLOGIC HISTORY:  Oncology History   # 2015- RIGHT BREAST CA STAGE I [T1bN0]ER/PR- POS; Her-2 NEU-NEG; s/p Lumpec & RT [Dr.Crystal] on tamoxifen [given osteoporosis]; NOV 2017- Start Arimidex; STOPPED JAN 2018- sec to intol  # Left breast papilloma-June 2017  # A.fib on anti-coag; [stop Tam sec to DI with Amiodarone]   # Osteoporosis; pct 2017-Vaginal bleeding/  D&C- Dr.Beasley- NEG     Carcinoma of overlapping sites of right breast in female, estrogen receptor positive ([place])     INTERVAL HISTORY:  A very pleasant 83 year old female patient with above history of stage I breast cancer of the right side currently on adjuvant Arimidex [switched from tamoxifen in November 2017] is here for follow-up.  Patient stopped her Arimidex approximately 4 weeks ago because of significant hot flashes and joint pains. She is reluctant to go on any further therapy.. Joint pains improve after coming off Arimidex.  Denies any unusual cough or shortness of breath or chest pain.  REVIEW OF SYSTEMS:  A complete 10 point review of system is done which is negative except mentioned above/history of present illness.   PAST MEDICAL HISTORY :  Past Medical History:  Diagnosis Date  . Anemia   . Atrial fibrillation ([place])   . B12 deficiency   . Breast cancer ([place])   . Breast cancer, right breast ([name])    right breast Invasive mammary carcinoma grade 1  . Cataracts, bilateral   . COPD (chronic obstructive pulmonary disease) ([place])   . Hemorrhoids   . Lymphopenia   . Osteoarthritis   . Osteoporosis July 2015   seen on DEXA scan-T score of -2.9 in the left neck femur  . Pancytopenia ([place])   . Skin cancer   . Spastic dysphonia   . Thrombocytopenia ([place])   . Unsteady gait   . Zenker's diverticulum     PAST SURGICAL HISTORY :   Past Surgical History:   Procedure Laterality Date  . BREAST EXCISIONAL BIOPSY Left    x 20 years surgical bx  . BREAST EXCISIONAL BIOPSY Right 2015   breast cancer   . BREAST EXCISIONAL BIOPSY Left 01/24/2016   ultrasound - pathology pending  . BREAST LUMPECTOMY WITH NEEDLE LOCALIZATION Left 03/12/2016   Procedure: BREAST LUMPECTOMY WITH NEEDLE LOCALIZATION;  Surgeon: Leonie Green, MD;  Location: [facility] ORS;  Service: General;  Laterality: Left;  . HYSTEROSCOPY W/D&C N/A 07/05/2016   Procedure: DILATATION AND CURETTAGE /HYSTEROSCOPY;  Surgeon: [name], MD;  Location: [facility] ORS;  Service: Gynecology;  Laterality: N/A;  . L1-L4 laminectomy and T6 fusion  May 0973   complicated by postoperative infection, more recently had recurrent pseudomonas infection at scar site March 2015  . REPLACEMENT TOTAL KNEE Bilateral   . ROTATOR CUFF REPAIR Bilateral   . ultrasound guided biopsy of breast  Jan 27, 2014    FAMILY HISTORY :   Family History  Problem Relation Age of Onset  . Throat cancer Mother   . Prostate cancer Brother   . Breast cancer Brother   . Leukemia Brother   . Lung cancer Father     SOCIAL HISTORY:   Social History  Substance Use Topics  . Smoking status: Never Smoker  . Smokeless tobacco: Never Used  . Alcohol use No    ALLERGIES:  is allergic to ace inhibitors; beta adrenergic blockers; morphine and  related; peanut-containing drug products; and codeine.  MEDICATIONS:  Current Outpatient Prescriptions  Medication Sig Dispense Refill  . ALPRAZolam (XANAX) 0.5 MG tablet Take 0.25 mg by mouth at bedtime.    Marland Kitchen amiodarone (PACERONE) 200 MG tablet Take 300 mg by mouth daily.    Marland Kitchen aspirin 81 MG chewable tablet Chew 81 mg by mouth daily.     . bisacodyl (DULCOLAX) 5 MG EC tablet Take 10 mg by mouth at bedtime.     . Cholecalciferol 2000 units CAPS Take 1 capsule by mouth daily.    . ciprofloxacin (CIPRO) 500 MG tablet Take 500 mg by mouth 2 (two) times daily.     . Cyanocobalamin (RA  VITAMIN B-12 TR) 1000 MCG TBCR Take 1,000 mcg by mouth daily.     Marland Kitchen docusate sodium (COLACE) 100 MG capsule Take 1 capsule (100 mg total) by mouth daily as needed for mild constipation. 60 capsule 3  . fexofenadine (ALLEGRA) 180 MG tablet Take 180 mg by mouth daily.    . furosemide (LASIX) 40 MG tablet Take 40 mg by mouth daily as needed.     . potassium chloride (K-DUR) 10 MEQ tablet Take 10 mEq by mouth daily as needed.    Marland Kitchen anastrozole (ARIMIDEX) 1 MG tablet Take 1 tablet (1 mg total) by mouth daily. (Patient not taking: Reported on 10/21/2016) 30 tablet 11  . ondansetron (ZOFRAN ODT) 4 MG disintegrating tablet Take 1 tablet (4 mg total) by mouth every 8 (eight) hours as needed for nausea or vomiting. (Patient not taking: Reported on 07/22/2016) 20 tablet 0  . warfarin (COUMADIN) 5 MG tablet Take 5 mg by mouth daily at 6 PM. Alternates with 4m tablet i.e. 6,5,6,5,6,5...    . warfarin (COUMADIN) 6 MG tablet Take 6 mg by mouth daily at 6 PM. Alternates with 547mtablet i.e. 5,6,5,6,5,6...     No current facility-administered medications for this visit.     PHYSICAL EXAMINATION: ECOG PERFORMANCE STATUS: 0 - Asymptomatic  BP (!) 164/73 (BP Location: Right Arm, Patient Position: Sitting)   Pulse (!) 58   Temp (!) 96.9 F (36.1 C) (Tympanic)   Wt 150 lb (68 kg)   BMI 26.57 kg/m   Filed Weights   10/21/16 1112  Weight: 150 lb (68 kg)    GENERAL: Well-nourished well-developed; Alert, no distress and comfortable.  She is alone. She needs help getting on the exam table.She walks with a cane. EYES: no pallor or icterus OROPHARYNX: no thrush or ulceration; good dentition  NECK: supple, no masses felt LYMPH:  no palpable lymphadenopathy in the cervical, axillary or inguinal regions LUNGS: clear to auscultation and  No wheeze or crackles HEART/CVS: regular rate & rhythm and no murmurs; No lower extremity edema ABDOMEN:abdomen soft, non-tender and normal bowel sounds Musculoskeletal:no  cyanosis of digits and no clubbing  PSYCH: alert & oriented x 3 with fluent speech NEURO: no focal motor/sensory deficits SKIN:  Multiple ecchymosis from Coumadin.    LABORATORY DATA:  I have reviewed the data as listed    Component Value Date/Time   NA 142 06/27/2016 1332   NA 147 (H) 08/14/2014 0416   K 4.1 06/27/2016 1332   K 3.9 08/14/2014 0416   CL 109 06/27/2016 1332   CL 114 (H) 08/14/2014 0416   CO2 27 06/27/2016 1332   CO2 25 08/14/2014 0416   GLUCOSE 90 06/27/2016 1332   GLUCOSE 91 08/14/2014 0416   BUN 15 06/27/2016 1332   BUN 9 08/14/2014 0416  CREATININE 1.05 (H) 06/27/2016 1332   CREATININE 0.90 08/14/2014 0416   CALCIUM 9.0 06/27/2016 1332   CALCIUM 8.0 (L) 08/14/2014 0416   PROT 6.2 (L) 03/04/2016 1331   PROT 6.9 08/13/2014 2058   ALBUMIN 3.7 03/04/2016 1331   ALBUMIN 3.2 (L) 08/13/2014 2058   AST 26 03/04/2016 1331   AST 32 08/13/2014 2058   ALT 16 03/04/2016 1331   ALT 22 08/13/2014 2058   ALKPHOS 29 (L) 03/04/2016 1331   ALKPHOS 50 08/13/2014 2058   BILITOT 0.7 03/04/2016 1331   BILITOT 0.4 08/13/2014 2058   GFRNONAA 48 (L) 06/27/2016 1332   GFRNONAA >60 08/14/2014 0416   GFRNONAA 42 (L) 05/03/2014 1105   GFRAA 56 (L) 06/27/2016 1332   GFRAA >60 08/14/2014 0416   GFRAA 49 (L) 05/03/2014 1105    No results found for: SPEP, UPEP  Lab Results  Component Value Date   WBC 3.3 (L) 07/05/2016   NEUTROABS 2.2 07/05/2016   HGB 12.7 07/05/2016   HCT 36.6 07/05/2016   MCV 91.3 07/05/2016   PLT 122 (L) 07/05/2016      Chemistry      Component Value Date/Time   NA 142 06/27/2016 1332   NA 147 (H) 08/14/2014 0416   K 4.1 06/27/2016 1332   K 3.9 08/14/2014 0416   CL 109 06/27/2016 1332   CL 114 (H) 08/14/2014 0416   CO2 27 06/27/2016 1332   CO2 25 08/14/2014 0416   BUN 15 06/27/2016 1332   BUN 9 08/14/2014 0416   CREATININE 1.05 (H) 06/27/2016 1332   CREATININE 0.90 08/14/2014 0416      Component Value Date/Time   CALCIUM 9.0 06/27/2016  1332   CALCIUM 8.0 (L) 08/14/2014 0416   ALKPHOS 29 (L) 03/04/2016 1331   ALKPHOS 50 08/13/2014 2058   AST 26 03/04/2016 1331   AST 32 08/13/2014 2058   ALT 16 03/04/2016 1331   ALT 22 08/13/2014 2058   BILITOT 0.7 03/04/2016 1331   BILITOT 0.4 08/13/2014 2058     Site Region Measured Measured WHO Young Adult BMD Date       Age      Classification T-score Left Forearm Radius 33% 08/22/2016 82.9 Osteoporosis -3.1 0.603 g/cm2  DualFemur Neck Left 08/22/2016 82.7 Osteoporosis -3.0 0.617 g/cm2  World Health Organization [facility]) criteria for post-menopausal, Caucasian Women: Normal:       T-score at or above -1 SD Osteopenia:   T-score between -1 and -2.5 SD Osteoporosis: T-score at or below -2.5 SD    ASSESSMENT & PLAN:   Carcinoma of overlapping sites of right breast in female, estrogen receptor positive ([place]) # Right-sided breast cancer stage I ER/PR positive HER-2/neu negative. Patient is currently on adjuvant arimidex.  Clinically no evidence of recurrence noted.   # Patient stopped taking Arimidex approximately 4 weeks ago- given the hot flashes/body aches etc. At a long discussion with the patient regarding the in general recommendation for 5 years of anti-hormonal therapy. Discussed options of other anti-hormonal treatments; patient not too keen  given the similar side effect profile of other agents.  Patient approximately has been on antihormone therapy for about 3 years. She understands the risk of recurrence/metastatic disease with less than optimal duration of therapy.  # osteoporosis- bone density in November 2017 shows T score -3. Discussed my concerns of impending fractures. Patient and Vitamin D and Also Exercising. Recommend prolia every 6 months subcutaneous. Discussed hypocalcemia and rare risk of ONJ. Patient is interested.  We'll check with her insurance. We'll repeat a BMP prior   #  A.fib- on coumadin; on  Amio.  # Ordered a mammogram for May 2017 ; given  scheduling/ discussed with patient agreeable to follow up with Dr. Janese Banks.     Cammie Sickle, MD 10/21/2016 2:33 PM

## 2016-10-21 NOTE — Progress Notes (Signed)
Patient here today for follow up.  Patient stopped Arimidex about 6 weeks ago due to hot flashes, headache and nausea that she states was due to the medication

## 2016-10-21 NOTE — Assessment & Plan Note (Addendum)
#  Right-sided breast cancer stage I ER/PR positive HER-2/neu negative. Patient is currently on adjuvant arimidex.  Clinically no evidence of recurrence noted.   # Patient stopped taking Arimidex approximately 4 weeks ago- given the hot flashes/body aches etc. At a long discussion with the patient regarding the in general recommendation for 5 years of anti-hormonal therapy. Discussed options of other anti-hormonal treatments; patient not too keen  given the similar side effect profile of other agents.  Patient approximately has been on antihormone therapy for about 3 years. She understands the risk of recurrence/metastatic disease with less than optimal duration of therapy.  # osteoporosis- bone density in November 2017 shows T score -3. Discussed my concerns of impending fractures. Patient and Vitamin D and Also Exercising. Recommend prolia every 6 months subcutaneous. Discussed hypocalcemia and rare risk of ONJ. Patient is interested.  We'll check with her insurance. We'll repeat a BMP prior   #  A.fib- on coumadin; on  Amio.  # Ordered a mammogram for May 2017 ; given scheduling/ discussed with patient agreeable to follow up with Dr. Janese Banks.

## 2016-10-22 ENCOUNTER — Ambulatory Visit: Payer: PPO

## 2016-10-22 ENCOUNTER — Other Ambulatory Visit: Payer: PPO

## 2016-10-28 ENCOUNTER — Inpatient Hospital Stay: Payer: PPO

## 2016-10-28 ENCOUNTER — Inpatient Hospital Stay: Payer: PPO | Attending: Oncology

## 2016-10-28 DIAGNOSIS — M818 Other osteoporosis without current pathological fracture: Secondary | ICD-10-CM

## 2016-10-28 DIAGNOSIS — C50811 Malignant neoplasm of overlapping sites of right female breast: Secondary | ICD-10-CM

## 2016-10-28 DIAGNOSIS — Z923 Personal history of irradiation: Secondary | ICD-10-CM | POA: Insufficient documentation

## 2016-10-28 DIAGNOSIS — Z170 Estrogen receptor positive status [ER+]: Secondary | ICD-10-CM

## 2016-10-28 DIAGNOSIS — Z9223 Personal history of estrogen therapy: Secondary | ICD-10-CM | POA: Diagnosis not present

## 2016-10-28 LAB — BASIC METABOLIC PANEL
Anion gap: 5 (ref 5–15)
BUN: 18 mg/dL (ref 6–20)
CHLORIDE: 107 mmol/L (ref 101–111)
CO2: 27 mmol/L (ref 22–32)
CREATININE: 1.22 mg/dL — AB (ref 0.44–1.00)
Calcium: 9.1 mg/dL (ref 8.9–10.3)
GFR calc Af Amer: 46 mL/min — ABNORMAL LOW (ref >60)
GFR calc non Af Amer: 40 mL/min — ABNORMAL LOW (ref >60)
GLUCOSE: 82 mg/dL (ref 65–99)
POTASSIUM: 3.5 mmol/L (ref 3.5–5.1)
SODIUM: 139 mmol/L (ref 135–145)

## 2016-10-28 MED ORDER — DENOSUMAB 60 MG/ML ~~LOC~~ SOLN
60.0000 mg | Freq: Once | SUBCUTANEOUS | Status: AC
Start: 2016-10-28 — End: 2016-10-28
  Administered 2016-10-28: 60 mg via SUBCUTANEOUS
  Filled 2016-10-28: qty 1

## 2016-11-11 DIAGNOSIS — E782 Mixed hyperlipidemia: Secondary | ICD-10-CM | POA: Diagnosis not present

## 2016-11-11 DIAGNOSIS — I482 Chronic atrial fibrillation: Secondary | ICD-10-CM | POA: Diagnosis not present

## 2016-11-11 DIAGNOSIS — E538 Deficiency of other specified B group vitamins: Secondary | ICD-10-CM | POA: Diagnosis not present

## 2016-11-18 DIAGNOSIS — I482 Chronic atrial fibrillation: Secondary | ICD-10-CM | POA: Diagnosis not present

## 2016-11-18 DIAGNOSIS — Z0000 Encounter for general adult medical examination without abnormal findings: Secondary | ICD-10-CM | POA: Diagnosis not present

## 2016-11-18 DIAGNOSIS — E538 Deficiency of other specified B group vitamins: Secondary | ICD-10-CM | POA: Diagnosis not present

## 2016-11-19 ENCOUNTER — Other Ambulatory Visit: Payer: Self-pay | Admitting: Internal Medicine

## 2016-11-19 DIAGNOSIS — Z853 Personal history of malignant neoplasm of breast: Secondary | ICD-10-CM

## 2016-11-27 DIAGNOSIS — Z0000 Encounter for general adult medical examination without abnormal findings: Secondary | ICD-10-CM | POA: Diagnosis not present

## 2016-12-17 DIAGNOSIS — I482 Chronic atrial fibrillation: Secondary | ICD-10-CM | POA: Diagnosis not present

## 2016-12-26 DIAGNOSIS — I482 Chronic atrial fibrillation: Secondary | ICD-10-CM | POA: Diagnosis not present

## 2016-12-31 DIAGNOSIS — Z961 Presence of intraocular lens: Secondary | ICD-10-CM | POA: Diagnosis not present

## 2017-01-13 ENCOUNTER — Ambulatory Visit
Admission: RE | Admit: 2017-01-13 | Discharge: 2017-01-13 | Disposition: A | Discharge status: Home / Self Care | Payer: PPO | Source: Ambulatory Visit | Attending: Internal Medicine | Admitting: Internal Medicine

## 2017-01-13 DIAGNOSIS — Z9889 Other specified postprocedural states: Secondary | ICD-10-CM | POA: Diagnosis not present

## 2017-01-13 DIAGNOSIS — Z853 Personal history of malignant neoplasm of breast: Secondary | ICD-10-CM

## 2017-01-13 DIAGNOSIS — R928 Other abnormal and inconclusive findings on diagnostic imaging of breast: Secondary | ICD-10-CM | POA: Diagnosis not present

## 2017-01-13 HISTORY — DX: Personal history of irradiation: Z92.3

## 2017-02-06 DIAGNOSIS — I482 Chronic atrial fibrillation: Secondary | ICD-10-CM | POA: Diagnosis not present

## 2017-02-19 DIAGNOSIS — C44319 Basal cell carcinoma of skin of other parts of face: Secondary | ICD-10-CM | POA: Diagnosis not present

## 2017-02-19 DIAGNOSIS — L578 Other skin changes due to chronic exposure to nonionizing radiation: Secondary | ICD-10-CM | POA: Diagnosis not present

## 2017-02-19 DIAGNOSIS — D485 Neoplasm of uncertain behavior of skin: Secondary | ICD-10-CM | POA: Diagnosis not present

## 2017-02-19 DIAGNOSIS — L821 Other seborrheic keratosis: Secondary | ICD-10-CM | POA: Diagnosis not present

## 2017-02-19 DIAGNOSIS — L820 Inflamed seborrheic keratosis: Secondary | ICD-10-CM | POA: Diagnosis not present

## 2017-03-11 DIAGNOSIS — I482 Chronic atrial fibrillation: Secondary | ICD-10-CM | POA: Diagnosis not present

## 2017-04-09 DIAGNOSIS — I4891 Unspecified atrial fibrillation: Secondary | ICD-10-CM | POA: Diagnosis not present

## 2017-04-22 ENCOUNTER — Inpatient Hospital Stay: Payer: PPO | Attending: Oncology | Admitting: Oncology

## 2017-04-22 ENCOUNTER — Ambulatory Visit: Payer: PPO | Admitting: Oncology

## 2017-04-22 VITALS — BP 154/80 | HR 66 | Temp 97.6°F | Resp 18 | Wt 145.7 lb

## 2017-04-22 DIAGNOSIS — Z803 Family history of malignant neoplasm of breast: Secondary | ICD-10-CM | POA: Insufficient documentation

## 2017-04-22 DIAGNOSIS — Z853 Personal history of malignant neoplasm of breast: Secondary | ICD-10-CM

## 2017-04-22 DIAGNOSIS — Z7982 Long term (current) use of aspirin: Secondary | ICD-10-CM | POA: Diagnosis not present

## 2017-04-22 DIAGNOSIS — Z800 Family history of malignant neoplasm of digestive organs: Secondary | ICD-10-CM | POA: Diagnosis not present

## 2017-04-22 DIAGNOSIS — Z923 Personal history of irradiation: Secondary | ICD-10-CM | POA: Insufficient documentation

## 2017-04-22 DIAGNOSIS — Z7901 Long term (current) use of anticoagulants: Secondary | ICD-10-CM | POA: Insufficient documentation

## 2017-04-22 DIAGNOSIS — Z8042 Family history of malignant neoplasm of prostate: Secondary | ICD-10-CM | POA: Diagnosis not present

## 2017-04-22 DIAGNOSIS — Z79899 Other long term (current) drug therapy: Secondary | ICD-10-CM | POA: Insufficient documentation

## 2017-04-22 DIAGNOSIS — Z170 Estrogen receptor positive status [ER+]: Secondary | ICD-10-CM | POA: Diagnosis not present

## 2017-04-22 DIAGNOSIS — J449 Chronic obstructive pulmonary disease, unspecified: Secondary | ICD-10-CM | POA: Diagnosis not present

## 2017-04-22 DIAGNOSIS — Z801 Family history of malignant neoplasm of trachea, bronchus and lung: Secondary | ICD-10-CM | POA: Insufficient documentation

## 2017-04-22 DIAGNOSIS — Z9223 Personal history of estrogen therapy: Secondary | ICD-10-CM | POA: Insufficient documentation

## 2017-04-22 DIAGNOSIS — I4891 Unspecified atrial fibrillation: Secondary | ICD-10-CM

## 2017-04-22 DIAGNOSIS — K0889 Other specified disorders of teeth and supporting structures: Secondary | ICD-10-CM | POA: Diagnosis not present

## 2017-04-22 DIAGNOSIS — M810 Age-related osteoporosis without current pathological fracture: Secondary | ICD-10-CM | POA: Diagnosis not present

## 2017-04-22 DIAGNOSIS — Z806 Family history of leukemia: Secondary | ICD-10-CM | POA: Diagnosis not present

## 2017-04-22 DIAGNOSIS — M1990 Unspecified osteoarthritis, unspecified site: Secondary | ICD-10-CM | POA: Diagnosis not present

## 2017-04-22 DIAGNOSIS — C50811 Malignant neoplasm of overlapping sites of right female breast: Secondary | ICD-10-CM

## 2017-04-22 NOTE — Progress Notes (Signed)
Hematology/Oncology Consult note [facility]  Telephone:([phone] [phone]  Patient Care Team: [name], MD as PCP - General (Internal Medicine)   Name of the patient: Kelly Bishop  [id number]  13-Aug-1932   Date of visit: 04/22/17  Diagnosis- h/o right breast cancer  Chief complaint/ Reason for visit- routine f/u  Heme/Onc history:  Oncology History   # 2015- RIGHT BREAST CA STAGE I [T1bN0]ER/PR- POS; Her-2 NEU-NEG; s/p Lumpec & RT [Dr.Crystal] on tamoxifen [given osteoporosis]; NOV 2017- Start Arimidex; STOPPED JAN 2018- sec to intol  # Left breast papilloma-June 2017  # A.fib on anti-coag; [stop Tam sec to DI with Amiodarone]   # Osteoporosis; pct 2017-Vaginal bleeding/  D&C- Dr.Beasley- NEG     Carcinoma of overlapping sites of right breast in female, estrogen receptor positive ([place])   she is currently off hormone therapy. On prolia for osteoporosis   Interval history- she is getting dental implants done next week. Denies any complaints today ECOG PS- 1 Pain scale- 0   Review of systems- Review of Systems  Constitutional: Negative for chills, fever, malaise/fatigue and weight loss.  HENT: Negative for congestion, ear discharge and nosebleeds.   Eyes: Negative for blurred vision.  Respiratory: Negative for cough, hemoptysis, sputum production, shortness of breath and wheezing.   Cardiovascular: Negative for chest pain, palpitations, orthopnea and claudication.  Gastrointestinal: Negative for abdominal pain, blood in stool, constipation, diarrhea, heartburn, melena, nausea and vomiting.  Genitourinary: Negative for dysuria, flank pain, frequency, hematuria and urgency.  Musculoskeletal: Negative for back pain, joint pain and myalgias.  Skin: Negative for rash.  Neurological: Negative for dizziness, tingling, focal weakness, seizures, weakness and headaches.  Endo/Heme/Allergies: Does not bruise/bleed easily.    Psychiatric/Behavioral: Negative for depression and suicidal ideas. The patient does not have insomnia.        Allergies  Allergen Reactions  . Ace Inhibitors Cough  . Beta Adrenergic Blockers Other (See Comments)  . Morphine And Related Nausea And Vomiting  . Peanut-Containing Drug Products Hives  . Codeine Nausea Only and Rash     Past Medical History:  Diagnosis Date  . Anemia   . Atrial fibrillation ([place])   . B12 deficiency   . Breast cancer ([place]) 2015   Right  . Breast cancer, right breast ([name])    right breast Invasive mammary carcinoma grade 1  . Cataracts, bilateral   . COPD (chronic obstructive pulmonary disease) ([place])   . Hemorrhoids   . Lymphopenia   . Osteoarthritis   . Osteoporosis July 2015   seen on DEXA scan-T score of -2.9 in the left neck femur  . Pancytopenia ([place])   . Personal history of radiation therapy   . Skin cancer   . Spastic dysphonia   . Thrombocytopenia ([place])   . Unsteady gait   . Zenker's diverticulum      Past Surgical History:  Procedure Laterality Date  . BREAST EXCISIONAL BIOPSY Left    x 20 years surgical bx  . BREAST EXCISIONAL BIOPSY Right 2015   breast cancer   . BREAST EXCISIONAL BIOPSY Left 01/24/2016   ultrasound - Papaloma  . BREAST LUMPECTOMY WITH NEEDLE LOCALIZATION Left 03/12/2016   Procedure: BREAST LUMPECTOMY WITH NEEDLE LOCALIZATION;  Surgeon: Leonie Green, MD;  Location: [facility] ORS;  Service: General;  Laterality: Left;  . HYSTEROSCOPY W/D&C N/A 07/05/2016   Procedure: DILATATION AND CURETTAGE /HYSTEROSCOPY;  Surgeon: [name], MD;  Location: [facility] ORS;  Service: Gynecology;  Laterality: N/A;  . L1-L4 laminectomy and T6 fusion  May 4098   complicated by postoperative infection, more recently had recurrent pseudomonas infection at scar site March 2015  . REPLACEMENT TOTAL KNEE Bilateral   . ROTATOR CUFF REPAIR Bilateral   . ultrasound guided biopsy of breast  Jan 27, 2014    Social History   Social  History  . Marital status: Married    Spouse name: N/A  . Number of children: N/A  . Years of education: N/A   Occupational History  . Not on file.   Social History Main Topics  . Smoking status: Never Smoker  . Smokeless tobacco: Never Used  . Alcohol use No  . Drug use: No  . Sexual activity: No   Other Topics Concern  . Not on file   Social History Narrative  . No narrative on file    Family History  Problem Relation Age of Onset  . Throat cancer Mother   . Prostate cancer Brother   . Breast cancer Brother   . Leukemia Brother   . Lung cancer Father      Current Outpatient Prescriptions:  .  ALPRAZolam (XANAX) 0.5 MG tablet, Take 0.25 mg by mouth at bedtime., Disp: , Rfl:  .  amiodarone (PACERONE) 200 MG tablet, Take 300 mg by mouth daily., Disp: , Rfl:  .  anastrozole (ARIMIDEX) 1 MG tablet, Take 1 tablet (1 mg total) by mouth daily. (Patient not taking: Reported on 10/21/2016), Disp: 30 tablet, Rfl: 11 .  aspirin 81 MG chewable tablet, Chew 81 mg by mouth daily. , Disp: , Rfl:  .  bisacodyl (DULCOLAX) 5 MG EC tablet, Take 10 mg by mouth at bedtime. , Disp: , Rfl:  .  Cholecalciferol 2000 units CAPS, Take 1 capsule by mouth daily., Disp: , Rfl:  .  ciprofloxacin (CIPRO) 500 MG tablet, Take 500 mg by mouth 2 (two) times daily. , Disp: , Rfl:  .  Cyanocobalamin (RA VITAMIN B-12 TR) 1000 MCG TBCR, Take 1,000 mcg by mouth daily. , Disp: , Rfl:  .  docusate sodium (COLACE) 100 MG capsule, Take 1 capsule (100 mg total) by mouth daily as needed for mild constipation., Disp: 60 capsule, Rfl: 3 .  fexofenadine (ALLEGRA) 180 MG tablet, Take 180 mg by mouth daily., Disp: , Rfl:  .  furosemide (LASIX) 40 MG tablet, Take 40 mg by mouth daily as needed. , Disp: , Rfl:  .  ondansetron (ZOFRAN ODT) 4 MG disintegrating tablet, Take 1 tablet (4 mg total) by mouth every 8 (eight) hours as needed for nausea or vomiting. (Patient not taking: Reported on 07/22/2016), Disp: 20 tablet, Rfl:  0 .  potassium chloride (K-DUR) 10 MEQ tablet, Take 10 mEq by mouth daily as needed., Disp: , Rfl:  .  warfarin (COUMADIN) 5 MG tablet, Take 5 mg by mouth daily at 6 PM. Alternates with 48m tablet i.e. 6,5,6,5,6,5..., Disp: , Rfl:  .  warfarin (COUMADIN) 6 MG tablet, Take 6 mg by mouth daily at 6 PM. Alternates with 561mtablet i.e. 5,6,5,6,5,6..., Disp: , Rfl:   Physical exam:  Vitals:   04/22/17 1441  BP: (!) 154/80  Pulse: 66  Resp: 18  Temp: 97.6 F (36.4 C)  TempSrc: Tympanic  Weight: 145 lb 11.2 oz (66.1 kg)   Physical Exam  Constitutional: She is oriented to person, place, and time and well-developed, well-nourished, and in no distress.  HENT:  Head: Normocephalic and atraumatic.  Eyes: Pupils are equal,  round, and reactive to light. EOM are normal.  Neck: Normal range of motion.  Cardiovascular: Normal rate and normal heart sounds.   Hr irregular  Pulmonary/Chest: Effort normal and breath sounds normal.  Abdominal: Soft. Bowel sounds are normal.  Neurological: She is alert and oriented to person, place, and time.  Skin: Skin is warm and dry.  Changes on chronic hyperpigmentation   Breast exam was performed in seated and lying down position. Patient is status post right lumpectomy with a well-healed surgical scar. No evidence of any palpable masses. No evidence of axillary adenopathy. No evidence of any palpable masses or lumps in the left breast. No evidence of leftt axillary adenopathy   CMP Latest Ref Rng & Units 10/28/2016  Glucose 65 - 99 mg/dL 82  BUN 6 - 20 mg/dL 18  Creatinine 0.44 - 1.00 mg/dL 1.22(H)  Sodium 135 - 145 mmol/L 139  Potassium 3.5 - 5.1 mmol/L 3.5  Chloride 101 - 111 mmol/L 107  CO2 22 - 32 mmol/L 27  Calcium 8.9 - 10.3 mg/dL 9.1  Total Protein 6.5 - 8.1 g/dL -  Total Bilirubin 0.3 - 1.2 mg/dL -  Alkaline Phos 38 - 126 U/L -  AST 15 - 41 U/L -  ALT 14 - 54 U/L -   CBC Latest Ref Rng & Units 07/05/2016  WBC 3.6 - 11.0 K/uL 3.3(L)  Hemoglobin  12.0 - 16.0 g/dL 12.7  Hematocrit 35.0 - 47.0 % 36.6  Platelets 150 - 440 K/uL 122(L)      Assessment and plan- Patient is a 83 y.o. female h/o hormone positive right breast cancer s/p lumpectomy and RT. Hormone therapy stopped after 3 years due to intolerance  1. Clinically patient is doing well and there is no evidence of concerning these exam. Recent mammogram from April 2018 revealed no evidence of malignancy. Patient does not wish to restart any hormone therapy at this time. I will see her back in 6 months. She will get routine blood work done with pcp  2. Osteoporosis- received 1 dose of prolia in feb 2018. She is now getting dental work done and implants next week. I will hold off on prolia at this time. Given her age and benefit of prolia long term over 3-5 years, I am not sure if it will help her as much especially in the light of her dental issues. I will repeat bone density in 2019 and if it is worse, I will consider endocrinology referral at that time to discuss benefit of bisphosphonate therapy. Continue calcium and vit D   Visit Diagnosis 1. Carcinoma of overlapping sites of right breast in female, estrogen receptor positive ([place])   2. Osteoporosis without current pathological fracture, unspecified osteoporosis type      Dr. Randa Evens, MD, MPH [facility] Pager- [phone] 04/22/2017 3:07 PM

## 2017-04-22 NOTE — Progress Notes (Signed)
Here for follow up

## 2017-04-23 DIAGNOSIS — Z85828 Personal history of other malignant neoplasm of skin: Secondary | ICD-10-CM | POA: Diagnosis not present

## 2017-04-23 DIAGNOSIS — C44319 Basal cell carcinoma of skin of other parts of face: Secondary | ICD-10-CM | POA: Diagnosis not present

## 2017-04-23 DIAGNOSIS — C44719 Basal cell carcinoma of skin of left lower limb, including hip: Secondary | ICD-10-CM | POA: Diagnosis not present

## 2017-04-23 DIAGNOSIS — L578 Other skin changes due to chronic exposure to nonionizing radiation: Secondary | ICD-10-CM | POA: Diagnosis not present

## 2017-04-29 DIAGNOSIS — I4891 Unspecified atrial fibrillation: Secondary | ICD-10-CM | POA: Diagnosis not present

## 2017-05-08 DIAGNOSIS — Z0000 Encounter for general adult medical examination without abnormal findings: Secondary | ICD-10-CM | POA: Diagnosis not present

## 2017-05-08 DIAGNOSIS — I4891 Unspecified atrial fibrillation: Secondary | ICD-10-CM | POA: Diagnosis not present

## 2017-05-08 DIAGNOSIS — K225 Diverticulum of esophagus, acquired: Secondary | ICD-10-CM | POA: Diagnosis not present

## 2017-05-08 DIAGNOSIS — I482 Chronic atrial fibrillation: Secondary | ICD-10-CM | POA: Diagnosis not present

## 2017-05-08 DIAGNOSIS — E538 Deficiency of other specified B group vitamins: Secondary | ICD-10-CM | POA: Diagnosis not present

## 2017-05-08 DIAGNOSIS — R110 Nausea: Secondary | ICD-10-CM | POA: Diagnosis not present

## 2017-05-15 DIAGNOSIS — I4891 Unspecified atrial fibrillation: Secondary | ICD-10-CM | POA: Diagnosis not present

## 2017-05-15 DIAGNOSIS — I10 Essential (primary) hypertension: Secondary | ICD-10-CM | POA: Diagnosis not present

## 2017-05-15 DIAGNOSIS — R0602 Shortness of breath: Secondary | ICD-10-CM | POA: Diagnosis not present

## 2017-05-15 DIAGNOSIS — E782 Mixed hyperlipidemia: Secondary | ICD-10-CM | POA: Diagnosis not present

## 2017-05-19 DIAGNOSIS — E782 Mixed hyperlipidemia: Secondary | ICD-10-CM | POA: Diagnosis not present

## 2017-05-19 DIAGNOSIS — M810 Age-related osteoporosis without current pathological fracture: Secondary | ICD-10-CM | POA: Diagnosis not present

## 2017-05-19 DIAGNOSIS — D708 Other neutropenia: Secondary | ICD-10-CM | POA: Diagnosis not present

## 2017-05-19 DIAGNOSIS — E538 Deficiency of other specified B group vitamins: Secondary | ICD-10-CM | POA: Diagnosis not present

## 2017-05-19 DIAGNOSIS — I482 Chronic atrial fibrillation: Secondary | ICD-10-CM | POA: Diagnosis not present

## 2017-05-27 DIAGNOSIS — C44319 Basal cell carcinoma of skin of other parts of face: Secondary | ICD-10-CM | POA: Diagnosis not present

## 2017-05-27 DIAGNOSIS — L578 Other skin changes due to chronic exposure to nonionizing radiation: Secondary | ICD-10-CM | POA: Diagnosis not present

## 2017-05-27 DIAGNOSIS — Z85828 Personal history of other malignant neoplasm of skin: Secondary | ICD-10-CM | POA: Diagnosis not present

## 2017-05-27 DIAGNOSIS — L570 Actinic keratosis: Secondary | ICD-10-CM | POA: Diagnosis not present

## 2017-05-29 DIAGNOSIS — R58 Hemorrhage, not elsewhere classified: Secondary | ICD-10-CM | POA: Diagnosis not present

## 2017-05-30 DIAGNOSIS — C44319 Basal cell carcinoma of skin of other parts of face: Secondary | ICD-10-CM | POA: Diagnosis not present

## 2017-06-02 DIAGNOSIS — L820 Inflamed seborrheic keratosis: Secondary | ICD-10-CM | POA: Diagnosis not present

## 2017-06-02 DIAGNOSIS — L570 Actinic keratosis: Secondary | ICD-10-CM | POA: Diagnosis not present

## 2017-06-16 DIAGNOSIS — I4891 Unspecified atrial fibrillation: Secondary | ICD-10-CM | POA: Diagnosis not present

## 2017-06-16 DIAGNOSIS — R0602 Shortness of breath: Secondary | ICD-10-CM | POA: Diagnosis not present

## 2017-06-19 DIAGNOSIS — I482 Chronic atrial fibrillation: Secondary | ICD-10-CM | POA: Diagnosis not present

## 2017-06-23 DIAGNOSIS — I2721 Secondary pulmonary arterial hypertension: Secondary | ICD-10-CM | POA: Diagnosis not present

## 2017-06-23 DIAGNOSIS — I482 Chronic atrial fibrillation: Secondary | ICD-10-CM | POA: Diagnosis not present

## 2017-06-23 DIAGNOSIS — I10 Essential (primary) hypertension: Secondary | ICD-10-CM | POA: Diagnosis not present

## 2017-06-23 DIAGNOSIS — E782 Mixed hyperlipidemia: Secondary | ICD-10-CM | POA: Diagnosis not present

## 2017-06-24 ENCOUNTER — Other Ambulatory Visit: Payer: Self-pay | Admitting: Cardiology

## 2017-06-24 DIAGNOSIS — I2721 Secondary pulmonary arterial hypertension: Secondary | ICD-10-CM

## 2017-06-24 DIAGNOSIS — I4821 Permanent atrial fibrillation: Secondary | ICD-10-CM

## 2017-07-02 ENCOUNTER — Ambulatory Visit
Admission: RE | Admit: 2017-07-02 | Discharge: 2017-07-02 | Disposition: A | Discharge status: Home / Self Care | Payer: PPO | Source: Ambulatory Visit | Attending: Cardiology | Admitting: Cardiology

## 2017-07-02 DIAGNOSIS — J9811 Atelectasis: Secondary | ICD-10-CM | POA: Insufficient documentation

## 2017-07-02 DIAGNOSIS — I2721 Secondary pulmonary arterial hypertension: Secondary | ICD-10-CM | POA: Insufficient documentation

## 2017-07-02 DIAGNOSIS — I482 Chronic atrial fibrillation: Secondary | ICD-10-CM | POA: Insufficient documentation

## 2017-07-02 DIAGNOSIS — I517 Cardiomegaly: Secondary | ICD-10-CM | POA: Diagnosis not present

## 2017-07-02 DIAGNOSIS — I700 Atherosclerosis of aorta: Secondary | ICD-10-CM | POA: Diagnosis not present

## 2017-07-02 DIAGNOSIS — I4821 Permanent atrial fibrillation: Secondary | ICD-10-CM

## 2017-07-02 DIAGNOSIS — K8080 Other cholelithiasis without obstruction: Secondary | ICD-10-CM | POA: Insufficient documentation

## 2017-07-02 DIAGNOSIS — R0602 Shortness of breath: Secondary | ICD-10-CM | POA: Diagnosis not present

## 2017-07-02 DIAGNOSIS — Z981 Arthrodesis status: Secondary | ICD-10-CM | POA: Insufficient documentation

## 2017-07-02 LAB — POCT I-STAT CREATININE: Creatinine, Ser: 1.2 mg/dL — ABNORMAL HIGH (ref 0.44–1.00)

## 2017-07-02 MED ORDER — IOPAMIDOL (ISOVUE-370) INJECTION 76%
75.0000 mL | Freq: Once | INTRAVENOUS | Status: AC | PRN
  Administered 2017-07-02: 60 mL via INTRAVENOUS

## 2017-07-09 DIAGNOSIS — G4733 Obstructive sleep apnea (adult) (pediatric): Secondary | ICD-10-CM | POA: Diagnosis not present

## 2017-07-23 ENCOUNTER — Ambulatory Visit: Payer: PPO | Admitting: Cardiology

## 2017-07-23 DIAGNOSIS — I4891 Unspecified atrial fibrillation: Secondary | ICD-10-CM | POA: Diagnosis not present

## 2017-07-30 DIAGNOSIS — Z85828 Personal history of other malignant neoplasm of skin: Secondary | ICD-10-CM | POA: Diagnosis not present

## 2017-07-30 DIAGNOSIS — L570 Actinic keratosis: Secondary | ICD-10-CM | POA: Diagnosis not present

## 2017-07-30 DIAGNOSIS — L820 Inflamed seborrheic keratosis: Secondary | ICD-10-CM | POA: Diagnosis not present

## 2017-07-30 DIAGNOSIS — L578 Other skin changes due to chronic exposure to nonionizing radiation: Secondary | ICD-10-CM | POA: Diagnosis not present

## 2017-08-11 DIAGNOSIS — D708 Other neutropenia: Secondary | ICD-10-CM | POA: Diagnosis not present

## 2017-08-11 DIAGNOSIS — I4891 Unspecified atrial fibrillation: Secondary | ICD-10-CM | POA: Diagnosis not present

## 2017-09-03 DIAGNOSIS — I4891 Unspecified atrial fibrillation: Secondary | ICD-10-CM | POA: Diagnosis not present

## 2017-09-24 DIAGNOSIS — I4891 Unspecified atrial fibrillation: Secondary | ICD-10-CM | POA: Diagnosis not present

## 2017-10-02 DIAGNOSIS — L821 Other seborrheic keratosis: Secondary | ICD-10-CM | POA: Diagnosis not present

## 2017-10-02 DIAGNOSIS — L570 Actinic keratosis: Secondary | ICD-10-CM | POA: Diagnosis not present

## 2017-10-02 DIAGNOSIS — L812 Freckles: Secondary | ICD-10-CM | POA: Diagnosis not present

## 2017-10-02 DIAGNOSIS — L820 Inflamed seborrheic keratosis: Secondary | ICD-10-CM | POA: Diagnosis not present

## 2017-10-02 DIAGNOSIS — L578 Other skin changes due to chronic exposure to nonionizing radiation: Secondary | ICD-10-CM | POA: Diagnosis not present

## 2017-10-09 IMAGING — MG MM PLC BREAST LOC DEV 1ST LESION INC MAMMO GUIDE*L*
5 series · 5 of 5 positions shown · non-contrast
Comparison: Previous exams.

CLINICAL DATA: Preoperative needle localization for removal of left
breast lesion.

EXAM:
NEEDLE LOCALIZATION OF THE LEFT BREAST WITH MAMMO GUIDANCE

[L LM (1 of 2)]
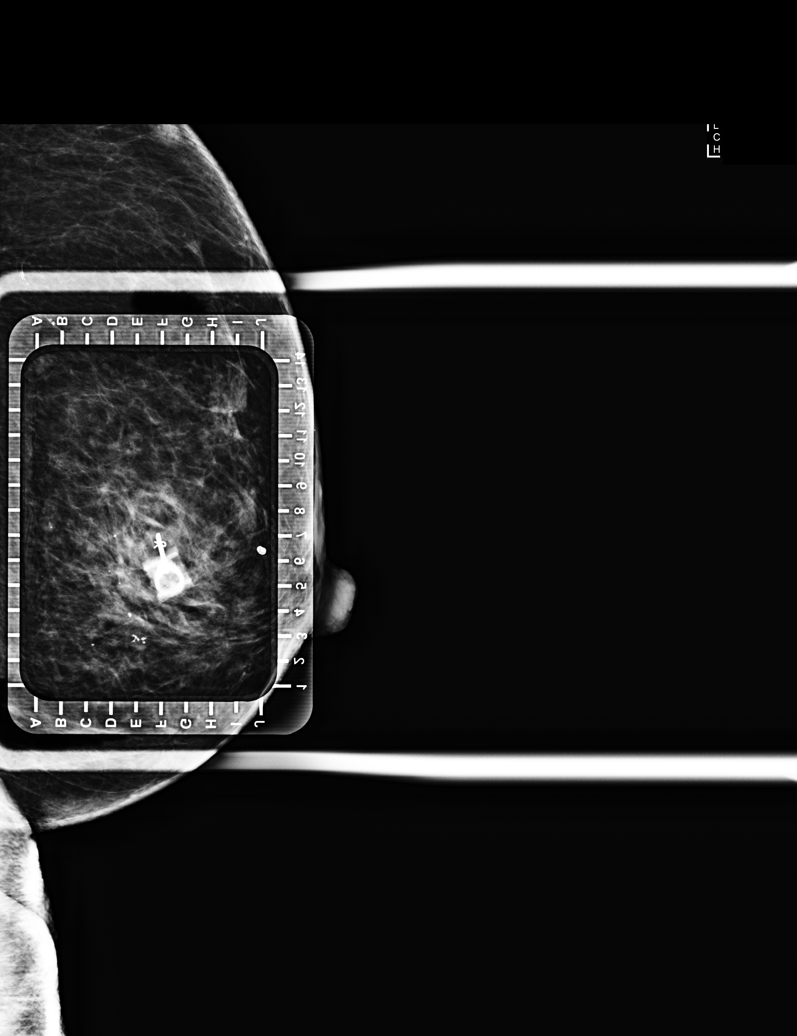

[L LM (2 of 2)]
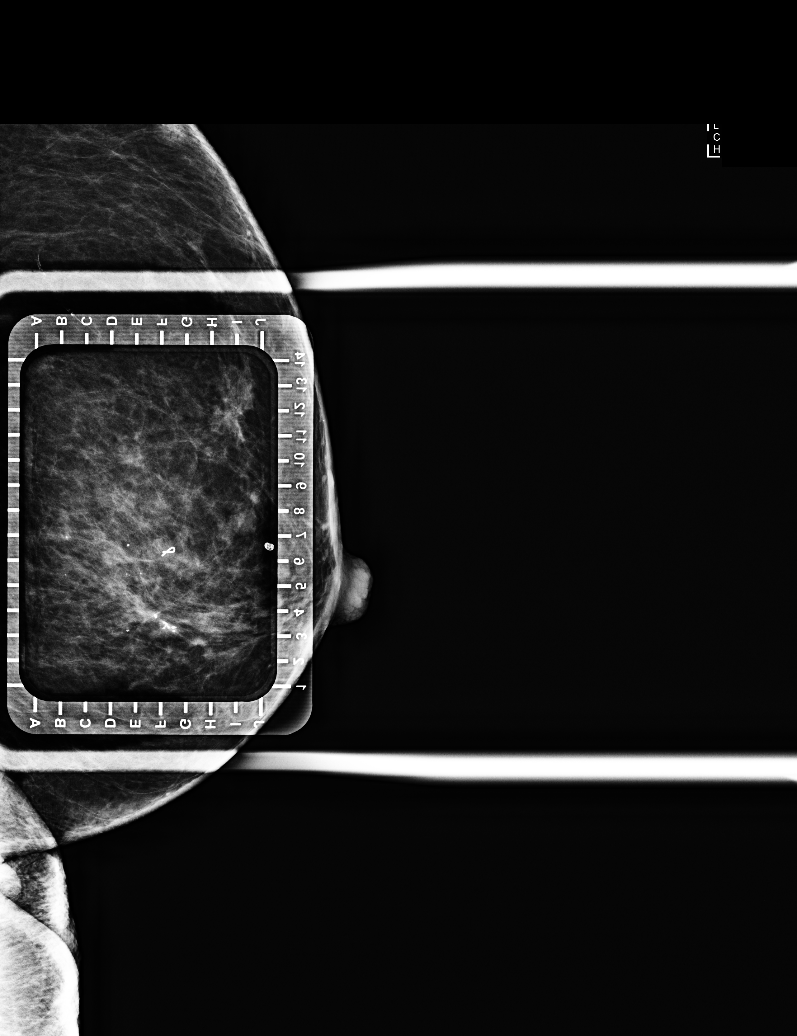

[L CC (1 of 3)]
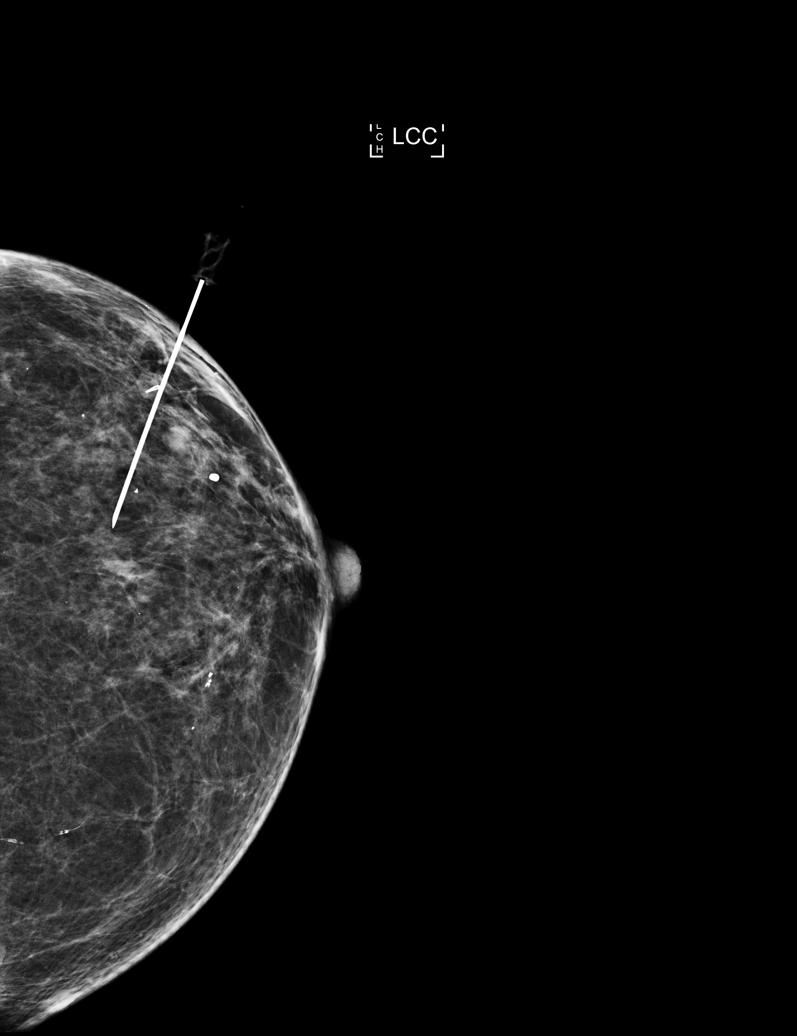

[L CC (2 of 3)]
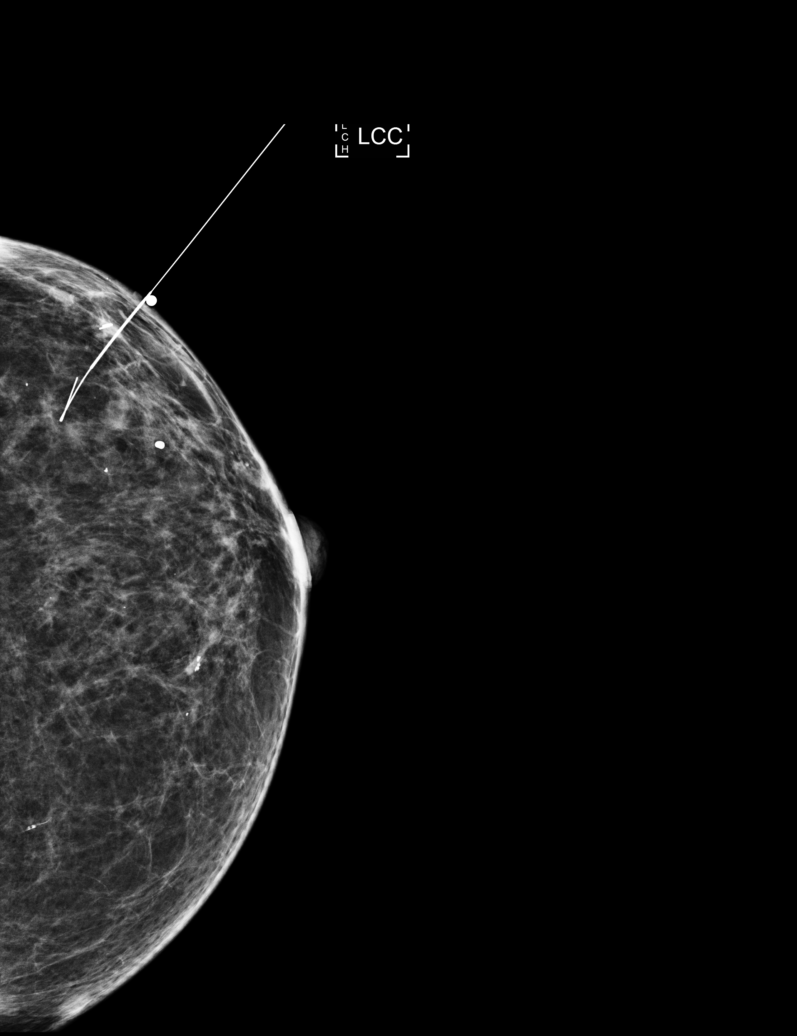

[L CC (3 of 3)]
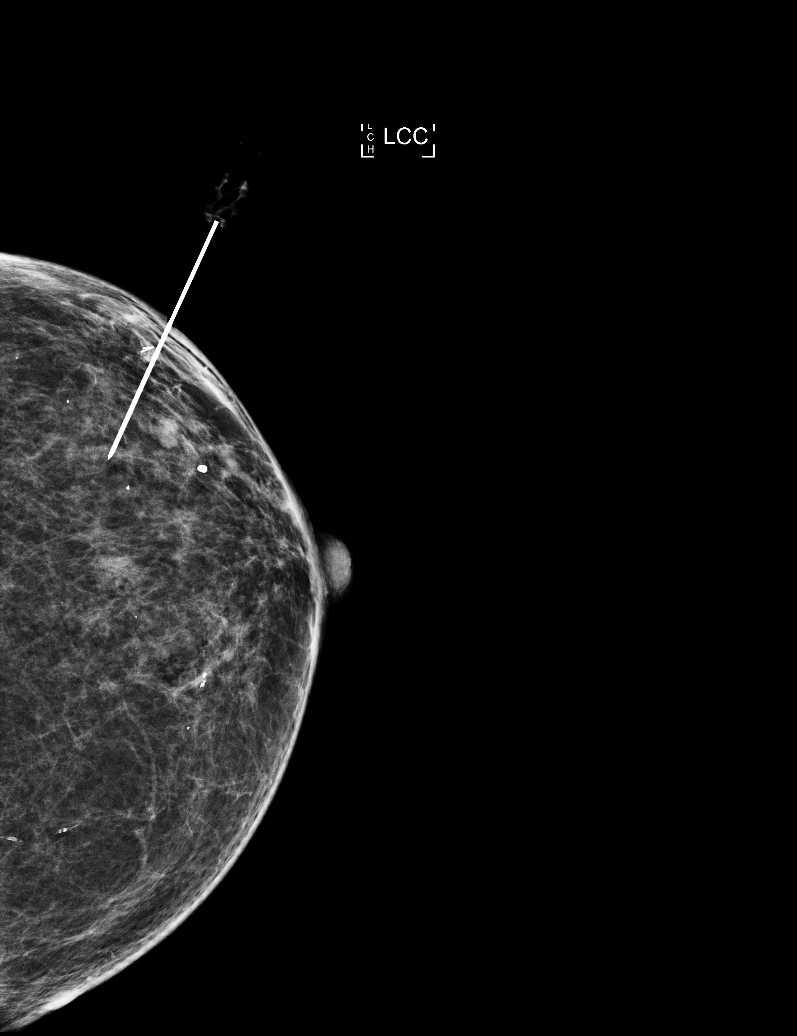

[5 of 5 positions shown; findings below may reference images not displayed]

FINDINGS: Patient presents for needle localization prior to surgical excision
of left breast lesion. I met with the patient and we discussed the
procedure of needle localization including benefits and
alternatives. We discussed the high likelihood of a successful
procedure. We discussed the risks of the procedure, including
infection, bleeding, tissue injury, and further surgery. Informed,
written consent was given. The usual time-out protocol was performed
immediately prior to the procedure.

Using mammographic guidance, sterile technique, 1% lidocaine and a 5
cm modified Kopans needle, the clip was localized using a lateral
approach. The images were marked for Dr. Latavia.
IMPRESSION: Needle localization left breast. No apparent complications.

## 2017-10-15 DIAGNOSIS — E782 Mixed hyperlipidemia: Secondary | ICD-10-CM | POA: Diagnosis not present

## 2017-10-15 DIAGNOSIS — I10 Essential (primary) hypertension: Secondary | ICD-10-CM | POA: Diagnosis not present

## 2017-10-15 DIAGNOSIS — I4891 Unspecified atrial fibrillation: Secondary | ICD-10-CM | POA: Diagnosis not present

## 2017-10-15 DIAGNOSIS — I480 Paroxysmal atrial fibrillation: Secondary | ICD-10-CM | POA: Diagnosis not present

## 2017-10-20 DIAGNOSIS — J301 Allergic rhinitis due to pollen: Secondary | ICD-10-CM | POA: Diagnosis not present

## 2017-10-20 DIAGNOSIS — H6121 Impacted cerumen, right ear: Secondary | ICD-10-CM | POA: Diagnosis not present

## 2017-10-20 DIAGNOSIS — J069 Acute upper respiratory infection, unspecified: Secondary | ICD-10-CM | POA: Diagnosis not present

## 2017-10-23 ENCOUNTER — Ambulatory Visit: Payer: PPO | Admitting: Oncology

## 2017-10-23 ENCOUNTER — Encounter: Payer: Self-pay | Admitting: Oncology

## 2017-10-23 ENCOUNTER — Inpatient Hospital Stay: Payer: PPO | Attending: Oncology | Admitting: Oncology

## 2017-10-23 VITALS — BP 151/76 | HR 58 | Temp 97.2°F | Resp 18 | Ht 63.0 in | Wt 140.9 lb

## 2017-10-23 DIAGNOSIS — C50811 Malignant neoplasm of overlapping sites of right female breast: Secondary | ICD-10-CM

## 2017-10-23 DIAGNOSIS — M818 Other osteoporosis without current pathological fracture: Secondary | ICD-10-CM | POA: Insufficient documentation

## 2017-10-23 DIAGNOSIS — Z170 Estrogen receptor positive status [ER+]: Secondary | ICD-10-CM

## 2017-10-23 DIAGNOSIS — Z08 Encounter for follow-up examination after completed treatment for malignant neoplasm: Secondary | ICD-10-CM

## 2017-10-23 DIAGNOSIS — Z853 Personal history of malignant neoplasm of breast: Secondary | ICD-10-CM | POA: Diagnosis not present

## 2017-10-23 DIAGNOSIS — I4891 Unspecified atrial fibrillation: Secondary | ICD-10-CM | POA: Diagnosis not present

## 2017-10-23 NOTE — Progress Notes (Signed)
Hematology/Oncology Consult note [facility]  [phone] [phone]  Patient Care Team: [name], MD as PCP - General (Internal Medicine)   Name of the patient: Kelly Bishop  [id number]  1931/11/29   Date of visit: 10/23/17  Diagnosis- h/o breast cancer  Chief complaint/ Reason for visit- routine f/u of breast cancer  Heme/Onc history:  Oncology History   # 2015- RIGHT BREAST CA STAGE I [T1bN0]ER/PR- POS; Her-2 NEU-NEG; s/p Lumpec & RT [Dr.Crystal] on tamoxifen [given osteoporosis]; NOV 2017- Start Arimidex; STOPPED JAN 2018- sec to intol  # Left breast papilloma-June 2017  # A.fib on anti-coag; [stop Tam sec to DI with Amiodarone]   # Osteoporosis; pct 2017-Vaginal bleeding/  D&C- Dr.Beasley- NEG     Carcinoma of overlapping sites of right breast in female, estrogen receptor positive ([place])   she is currently off hormone therapy. On prolia for osteoporosis- last dose in feb 2018   Interval history-patient did get her dental implants and there are no more plans for dental surgeries.  She will be getting mammograms through Dr. Sabra Heck in April 2019.  Her appetite is good and she denies any unintentional weight loss.  She has lost about 5 pounds over the last 7 months.  Her right breast occasionally feels tender.  She denies any new aches or pains anywhere  ECOG PS- 2 Pain scale- 2-right breast tenderness Opioid associated constipation- no  Review of systems- Review of Systems  Constitutional: Positive for malaise/fatigue. Negative for chills, fever and weight loss.  HENT: Negative for congestion, ear discharge and nosebleeds.   Eyes: Negative for blurred vision.  Respiratory: Negative for cough, hemoptysis, sputum production, shortness of breath and wheezing.   Cardiovascular: Negative for chest pain, palpitations, orthopnea and claudication.  Gastrointestinal: Negative for abdominal pain, blood in stool, constipation,  diarrhea, heartburn, melena, nausea and vomiting.  Genitourinary: Negative for dysuria, flank pain, frequency, hematuria and urgency.  Musculoskeletal: Negative for back pain, joint pain and myalgias.  Skin: Negative for rash.  Neurological: Negative for dizziness, tingling, focal weakness, seizures, weakness and headaches.  Endo/Heme/Allergies: Does not bruise/bleed easily.  Psychiatric/Behavioral: Negative for depression and suicidal ideas. The patient does not have insomnia.       Allergies  Allergen Reactions  . Ace Inhibitors Cough  . Beta Adrenergic Blockers Other (See Comments)  . Morphine And Related Nausea And Vomiting  . Peanut-Containing Drug Products Hives  . Codeine Nausea Only and Rash     Past Medical History:  Diagnosis Date  . Anemia   . Atrial fibrillation ([place])   . B12 deficiency   . Breast cancer ([place]) 2015   Right  . Breast cancer, right breast ([name])    right breast Invasive mammary carcinoma grade 1  . Cataracts, bilateral   . COPD (chronic obstructive pulmonary disease) ([place])   . Hemorrhoids   . Lymphopenia   . Osteoarthritis   . Osteoporosis July 2015   seen on DEXA scan-T score of -2.9 in the left neck femur  . Pancytopenia ([place])   . Personal history of radiation therapy   . Skin cancer   . Spastic dysphonia   . Thrombocytopenia ([place])   . Unsteady gait   . Zenker's diverticulum      Past Surgical History:  Procedure Laterality Date  . BREAST EXCISIONAL BIOPSY Left    x 20 years surgical bx  . BREAST EXCISIONAL BIOPSY Right 2015   breast cancer   . BREAST EXCISIONAL  BIOPSY Left 01/24/2016   ultrasound - Papaloma  . BREAST LUMPECTOMY WITH NEEDLE LOCALIZATION Left 03/12/2016   Procedure: BREAST LUMPECTOMY WITH NEEDLE LOCALIZATION;  Surgeon: Leonie Green, MD;  Location: [facility] ORS;  Service: General;  Laterality: Left;  . HYSTEROSCOPY W/D&C N/A 07/05/2016   Procedure: DILATATION AND CURETTAGE /HYSTEROSCOPY;  Surgeon: [name], MD;   Location: [facility] ORS;  Service: Gynecology;  Laterality: N/A;  . L1-L4 laminectomy and T6 fusion  May 8242   complicated by postoperative infection, more recently had recurrent pseudomonas infection at scar site March 2015  . REPLACEMENT TOTAL KNEE Bilateral   . ROTATOR CUFF REPAIR Bilateral   . ultrasound guided biopsy of breast  Jan 27, 2014    Social History   Socioeconomic History  . Marital status: Married    Spouse name: Not on file  . Number of children: Not on file  . Years of education: Not on file  . Highest education level: Not on file  Social Needs  . Financial resource strain: Not on file  . Food insecurity - worry: Not on file  . Food insecurity - inability: Not on file  . Transportation needs - medical: Not on file  . Transportation needs - non-medical: Not on file  Occupational History  . Not on file  Tobacco Use  . Smoking status: Never Smoker  . Smokeless tobacco: Never Used  Substance and Sexual Activity  . Alcohol use: No    Alcohol/week: 0.0 oz  . Drug use: No  . Sexual activity: No  Other Topics Concern  . Not on file  Social History Narrative  . Not on file    Family History  Problem Relation Age of Onset  . Throat cancer Mother   . Prostate cancer Brother   . Breast cancer Brother   . Leukemia Brother   . Lung cancer Father      Current Outpatient Medications:  .  ALPRAZolam (XANAX) 0.5 MG tablet, Take 0.25 mg by mouth at bedtime., Disp: , Rfl:  .  amiodarone (PACERONE) 200 MG tablet, Take 300 mg by mouth daily., Disp: , Rfl:  .  amoxicillin (AMOXIL) 500 MG capsule, , Disp: , Rfl:  .  anastrozole (ARIMIDEX) 1 MG tablet, Take 1 tablet (1 mg total) by mouth daily. (Patient not taking: Reported on 10/21/2016), Disp: 30 tablet, Rfl: 11 .  aspirin 81 MG chewable tablet, Chew 81 mg by mouth daily. , Disp: , Rfl:  .  bisacodyl (DULCOLAX) 5 MG EC tablet, Take 10 mg by mouth at bedtime. , Disp: , Rfl:  .  Calcium Carbonate-Vit D-Min (CALCIUM 600+D  PLUS MINERALS) 600-400 MG-UNIT CHEW, Chew by mouth., Disp: , Rfl:  .  Cholecalciferol 2000 units CAPS, Take 1 capsule by mouth daily., Disp: , Rfl:  .  ciprofloxacin (CIPRO) 500 MG tablet, Take 500 mg by mouth 2 (two) times daily. , Disp: , Rfl:  .  Cyanocobalamin (RA VITAMIN B-12 TR) 1000 MCG TBCR, Take 1,000 mcg by mouth daily. , Disp: , Rfl:  .  denosumab (PROLIA) 60 MG/ML SOLN injection, Inject into the skin., Disp: , Rfl:  .  docusate sodium (COLACE) 100 MG capsule, Take 1 capsule (100 mg total) by mouth daily as needed for mild constipation., Disp: 60 capsule, Rfl: 3 .  fexofenadine (ALLEGRA) 180 MG tablet, Take 180 mg by mouth daily., Disp: , Rfl:  .  furosemide (LASIX) 40 MG tablet, Take 40 mg by mouth daily as needed. , Disp: , Rfl:  .  ondansetron (ZOFRAN ODT) 4 MG disintegrating tablet, Take 1 tablet (4 mg total) by mouth every 8 (eight) hours as needed for nausea or vomiting. (Patient not taking: Reported on 07/22/2016), Disp: 20 tablet, Rfl: 0 .  potassium chloride (K-DUR) 10 MEQ tablet, Take 10 mEq by mouth daily as needed., Disp: , Rfl:  .  warfarin (COUMADIN) 5 MG tablet, Take 5 mg by mouth daily at 6 PM. Alternates with 35m tablet i.e. 6,5,6,5,6,5..., Disp: , Rfl:  .  warfarin (COUMADIN) 6 MG tablet, Take 6 mg by mouth daily at 6 PM. Alternates with 5106mtablet i.e. 5,6,5,6,5,6..., Disp: , Rfl:   Physical exam:  Vitals:   10/23/17 0951  BP: (!) 151/76  Pulse: (!) 58  Resp: 18  Temp: (!) 97.2 F (36.2 C)  TempSrc: Tympanic  Weight: 140 lb 14.4 oz (63.9 kg)  Height: 5' 3"  (1.6 m)   Physical Exam  Constitutional: She is oriented to person, place, and time.  Thin elderly female in no acute distress  HENT:  Head: Normocephalic and atraumatic.  Eyes: EOM are normal. Pupils are equal, round, and reactive to light.  Neck: Normal range of motion.  Cardiovascular: Normal rate, regular rhythm and normal heart sounds.  Pulmonary/Chest: Effort normal and breath sounds normal.    Abdominal: Soft. Bowel sounds are normal.  Neurological: She is alert and oriented to person, place, and time.  Skin: Skin is warm and dry.   Breast exam was performed in seated and lying down position. Patient is status post right lumpectomy with a well-healed surgical scar. No evidence of any palpable masses.  There is a lumpy feel to her right breast but no obvious masses noted. no evidence of axillary adenopathy. No evidence of any palpable masses or lumps in the left breast. No evidence of leftt axillary adenopathy   CMP Latest Ref Rng & Units 07/02/2017  Glucose 65 - 99 mg/dL -  BUN 6 - 20 mg/dL -  Creatinine 0.44 - 1.00 mg/dL 1.20(H)  Sodium 135 - 145 mmol/L -  Potassium 3.5 - 5.1 mmol/L -  Chloride 101 - 111 mmol/L -  CO2 22 - 32 mmol/L -  Calcium 8.9 - 10.3 mg/dL -  Total Protein 6.5 - 8.1 g/dL -  Total Bilirubin 0.3 - 1.2 mg/dL -  Alkaline Phos 38 - 126 U/L -  AST 15 - 41 U/L -  ALT 14 - 54 U/L -   CBC Latest Ref Rng & Units 07/05/2016  WBC 3.6 - 11.0 K/uL 3.3(L)  Hemoglobin 12.0 - 16.0 g/dL 12.7  Hematocrit 35.0 - 47.0 % 36.6  Platelets 150 - 440 K/uL 122(L)      Assessment and plan- Patient is a [age].o. female h/o hormone positive right breast cancer s/p lumpectomy and RT. Hormone therapy stopped after 3 years due to intolerance  1.  Patient stopped taking anti-hormone therapy after 3 years and stopped it about a year ago.  She is almost 5 years post lumpectomy.  There was initial concern about interaction of hormone therapy with amiodarone.  I however I do not note any interactions of amiodarone with tamoxifen or Arimidex.  Regardless due to prior side effects patient did not want to restart treatment at this time.  She will continue to get yearly mammograms.  She is due for one in April 2019  2.  Osteoporosis: Last dose of Prolia was in February 2018.  Following that she underwent dental implants and therefore the next dose of Prolia was  held.  I will repeat her  bone density scan at this time and plan to reinitiate Prolia with her next visit based on the results of bone density   Visit Diagnosis 1. Other osteoporosis without current pathological fracture   2. Carcinoma of overlapping sites of right breast in female, estrogen receptor positive ([place])   3. Encounter for follow-up surveillance of breast cancer      Dr. Randa Evens, MD, MPH [facility] Pager- [phone] 10/23/2017 10:21 AM

## 2017-10-23 NOTE — Progress Notes (Signed)
Pt having tenderness and lumps in breast and had this since her surgery.

## 2017-11-13 DIAGNOSIS — D708 Other neutropenia: Secondary | ICD-10-CM | POA: Diagnosis not present

## 2017-11-13 DIAGNOSIS — E538 Deficiency of other specified B group vitamins: Secondary | ICD-10-CM | POA: Diagnosis not present

## 2017-11-13 DIAGNOSIS — L508 Other urticaria: Secondary | ICD-10-CM | POA: Diagnosis not present

## 2017-11-13 DIAGNOSIS — I482 Chronic atrial fibrillation: Secondary | ICD-10-CM | POA: Diagnosis not present

## 2017-11-13 DIAGNOSIS — E782 Mixed hyperlipidemia: Secondary | ICD-10-CM | POA: Diagnosis not present

## 2017-11-19 DIAGNOSIS — I482 Chronic atrial fibrillation: Secondary | ICD-10-CM | POA: Diagnosis not present

## 2017-11-19 DIAGNOSIS — E538 Deficiency of other specified B group vitamins: Secondary | ICD-10-CM | POA: Diagnosis not present

## 2017-11-19 DIAGNOSIS — Z0000 Encounter for general adult medical examination without abnormal findings: Secondary | ICD-10-CM | POA: Diagnosis not present

## 2017-12-08 DIAGNOSIS — L2489 Irritant contact dermatitis due to other agents: Secondary | ICD-10-CM | POA: Diagnosis not present

## 2017-12-08 DIAGNOSIS — L821 Other seborrheic keratosis: Secondary | ICD-10-CM | POA: Diagnosis not present

## 2017-12-08 DIAGNOSIS — L570 Actinic keratosis: Secondary | ICD-10-CM | POA: Diagnosis not present

## 2017-12-08 DIAGNOSIS — D485 Neoplasm of uncertain behavior of skin: Secondary | ICD-10-CM | POA: Diagnosis not present

## 2017-12-08 DIAGNOSIS — L578 Other skin changes due to chronic exposure to nonionizing radiation: Secondary | ICD-10-CM | POA: Diagnosis not present

## 2017-12-08 DIAGNOSIS — C44729 Squamous cell carcinoma of skin of left lower limb, including hip: Secondary | ICD-10-CM | POA: Diagnosis not present

## 2017-12-16 ENCOUNTER — Other Ambulatory Visit: Payer: Self-pay | Admitting: Internal Medicine

## 2017-12-16 DIAGNOSIS — Z853 Personal history of malignant neoplasm of breast: Secondary | ICD-10-CM

## 2017-12-16 DIAGNOSIS — Z1239 Encounter for other screening for malignant neoplasm of breast: Secondary | ICD-10-CM

## 2017-12-17 DIAGNOSIS — I482 Chronic atrial fibrillation: Secondary | ICD-10-CM | POA: Diagnosis not present

## 2017-12-17 DIAGNOSIS — I4891 Unspecified atrial fibrillation: Secondary | ICD-10-CM | POA: Diagnosis not present

## 2018-01-05 ENCOUNTER — Encounter: Payer: Self-pay | Admitting: Emergency Medicine

## 2018-01-05 ENCOUNTER — Other Ambulatory Visit: Payer: Self-pay

## 2018-01-05 ENCOUNTER — Emergency Department: Payer: PPO

## 2018-01-05 ENCOUNTER — Emergency Department
Admission: EM | Admit: 2018-01-05 | Discharge: 2018-01-05 | Disposition: A | Discharge status: Home / Self Care | Payer: PPO | Attending: Emergency Medicine | Admitting: Emergency Medicine

## 2018-01-05 DIAGNOSIS — R079 Chest pain, unspecified: Secondary | ICD-10-CM | POA: Insufficient documentation

## 2018-01-05 DIAGNOSIS — Z5321 Procedure and treatment not carried out due to patient leaving prior to being seen by health care provider: Secondary | ICD-10-CM | POA: Insufficient documentation

## 2018-01-05 LAB — APTT: aPTT: 32 seconds (ref 24–36)

## 2018-01-05 LAB — CBC
HCT: 39.8 % (ref 35.0–47.0)
Hemoglobin: 13.6 g/dL (ref 12.0–16.0)
MCH: 31.1 pg (ref 26.0–34.0)
MCHC: 34 g/dL (ref 32.0–36.0)
MCV: 91.4 fL (ref 80.0–100.0)
PLATELETS: 187 10*3/uL (ref 150–440)
RBC: 4.36 MIL/uL (ref 3.80–5.20)
RDW: 15 % — AB (ref 11.5–14.5)
WBC: 3.9 10*3/uL (ref 3.6–11.0)

## 2018-01-05 LAB — BASIC METABOLIC PANEL
Anion gap: 8 (ref 5–15)
BUN: 17 mg/dL (ref 6–20)
CO2: 29 mmol/L (ref 22–32)
CREATININE: 1.13 mg/dL — AB (ref 0.44–1.00)
Calcium: 9.4 mg/dL (ref 8.9–10.3)
Chloride: 103 mmol/L (ref 101–111)
GFR calc Af Amer: 50 mL/min — ABNORMAL LOW (ref >60)
GFR calc non Af Amer: 43 mL/min — ABNORMAL LOW (ref >60)
Glucose, Bld: 114 mg/dL — ABNORMAL HIGH (ref 65–99)
Potassium: 2.9 mmol/L — ABNORMAL LOW (ref 3.5–5.1)
SODIUM: 140 mmol/L (ref 135–145)

## 2018-01-05 LAB — PROTIME-INR
INR: 1.53
Prothrombin Time: 18.3 seconds — ABNORMAL HIGH (ref 11.4–15.2)

## 2018-01-05 LAB — TROPONIN I: Troponin I: <0.03 ng/mL (ref <0.03)

## 2018-01-05 NOTE — ED Triage Notes (Signed)
C/O left sided chest pain that radiates to back. States pain started last night.

## 2018-01-06 ENCOUNTER — Telehealth: Payer: Self-pay | Admitting: Emergency Medicine

## 2018-01-06 NOTE — Telephone Encounter (Signed)
Called patient due to lwot to inquire about condition and follow up plans. No answer and no voicemail available.

## 2018-01-07 DIAGNOSIS — I4891 Unspecified atrial fibrillation: Secondary | ICD-10-CM | POA: Diagnosis not present

## 2018-01-14 ENCOUNTER — Ambulatory Visit
Admission: RE | Admit: 2018-01-14 | Discharge: 2018-01-14 | Disposition: A | Discharge status: Home / Self Care | Payer: PPO | Source: Ambulatory Visit | Attending: Internal Medicine | Admitting: Internal Medicine

## 2018-01-14 DIAGNOSIS — R928 Other abnormal and inconclusive findings on diagnostic imaging of breast: Secondary | ICD-10-CM | POA: Diagnosis not present

## 2018-01-14 DIAGNOSIS — Z853 Personal history of malignant neoplasm of breast: Secondary | ICD-10-CM | POA: Diagnosis not present

## 2018-01-14 DIAGNOSIS — Z1239 Encounter for other screening for malignant neoplasm of breast: Secondary | ICD-10-CM

## 2018-02-17 DIAGNOSIS — I4891 Unspecified atrial fibrillation: Secondary | ICD-10-CM | POA: Diagnosis not present

## 2018-03-19 ENCOUNTER — Other Ambulatory Visit: Payer: PPO

## 2018-03-23 DIAGNOSIS — I482 Chronic atrial fibrillation: Secondary | ICD-10-CM | POA: Diagnosis not present

## 2018-04-21 DIAGNOSIS — I10 Essential (primary) hypertension: Secondary | ICD-10-CM | POA: Diagnosis not present

## 2018-04-21 DIAGNOSIS — I482 Chronic atrial fibrillation: Secondary | ICD-10-CM | POA: Diagnosis not present

## 2018-04-21 DIAGNOSIS — R079 Chest pain, unspecified: Secondary | ICD-10-CM | POA: Diagnosis not present

## 2018-04-21 DIAGNOSIS — E782 Mixed hyperlipidemia: Secondary | ICD-10-CM | POA: Diagnosis not present

## 2018-04-23 ENCOUNTER — Ambulatory Visit: Payer: PPO

## 2018-04-23 ENCOUNTER — Ambulatory Visit: Payer: PPO | Admitting: Oncology

## 2018-04-23 ENCOUNTER — Other Ambulatory Visit: Payer: PPO

## 2018-04-28 ENCOUNTER — Encounter: Payer: Self-pay | Admitting: Oncology

## 2018-04-28 ENCOUNTER — Inpatient Hospital Stay (HOSPITAL_BASED_OUTPATIENT_CLINIC_OR_DEPARTMENT_OTHER): Payer: PPO | Admitting: Oncology

## 2018-04-28 ENCOUNTER — Other Ambulatory Visit: Payer: Self-pay

## 2018-04-28 ENCOUNTER — Inpatient Hospital Stay: Payer: PPO | Attending: Oncology

## 2018-04-28 ENCOUNTER — Inpatient Hospital Stay: Payer: PPO

## 2018-04-28 VITALS — BP 160/84 | HR 61 | Temp 97.8°F | Resp 18 | Ht 63.0 in | Wt 132.1 lb

## 2018-04-28 DIAGNOSIS — Z853 Personal history of malignant neoplasm of breast: Secondary | ICD-10-CM | POA: Diagnosis not present

## 2018-04-28 DIAGNOSIS — I890 Lymphedema, not elsewhere classified: Secondary | ICD-10-CM | POA: Insufficient documentation

## 2018-04-28 DIAGNOSIS — M810 Age-related osteoporosis without current pathological fracture: Secondary | ICD-10-CM | POA: Insufficient documentation

## 2018-04-28 DIAGNOSIS — C50811 Malignant neoplasm of overlapping sites of right female breast: Secondary | ICD-10-CM

## 2018-04-28 DIAGNOSIS — E876 Hypokalemia: Secondary | ICD-10-CM

## 2018-04-28 DIAGNOSIS — N179 Acute kidney failure, unspecified: Secondary | ICD-10-CM

## 2018-04-28 DIAGNOSIS — Z08 Encounter for follow-up examination after completed treatment for malignant neoplasm: Secondary | ICD-10-CM

## 2018-04-28 DIAGNOSIS — Z170 Estrogen receptor positive status [ER+]: Principal | ICD-10-CM

## 2018-04-28 LAB — BASIC METABOLIC PANEL
ANION GAP: 9 (ref 5–15)
BUN: 25 mg/dL — ABNORMAL HIGH (ref 8–23)
CHLORIDE: 106 mmol/L (ref 98–111)
CO2: 25 mmol/L (ref 22–32)
CREATININE: 1.54 mg/dL — AB (ref 0.44–1.00)
Calcium: 8.8 mg/dL — ABNORMAL LOW (ref 8.9–10.3)
GFR calc non Af Amer: 30 mL/min — ABNORMAL LOW (ref >60)
GFR, EST AFRICAN AMERICAN: 35 mL/min — AB (ref >60)
Glucose, Bld: 96 mg/dL (ref 70–99)
POTASSIUM: 3 mmol/L — AB (ref 3.5–5.1)
Sodium: 140 mmol/L (ref 135–145)

## 2018-04-28 NOTE — Progress Notes (Signed)
Hematology/Oncology Consult note [facility]  [phone] Fax:([phone]  Patient Care Team: [name], MD as PCP - General (Internal Medicine)   Name of the patient: Kelly Bishop  [id number]  December 15, 1931   Date of visit: 04/28/18  Diagnosis- h/o breast cancer   Chief complaint/ Reason for visit- routine f/u of breast cancer  Heme/Onc history:  Oncology History   # 2015- RIGHT BREAST CA STAGE I [T1bN0]ER/PR- POS; Her-2 NEU-NEG; s/p Lumpec & RT [Dr.Crystal] on tamoxifen [given osteoporosis]; NOV 2017- Start Arimidex; STOPPED JAN 2018- sec to intol  # Left breast papilloma-June 2017  # A.fib on anti-coag; [stop Tam sec to DI with Amiodarone]   # Osteoporosis; pct 2017-Vaginal bleeding/  D&C- Dr.Beasley- NEG     Carcinoma of overlapping sites of right breast in female, estrogen receptor positive ([place])   She is currently off hormone therapy. prolia was on hold for dental implants and wasn't restarted after that  Interval history- her husband has now moved to memory care center. Her brother died yesterday and she is currently in mourning. Leg swelling and hyperpigmentation has been a chronic issue. No new complaints  ECOG PS- 2 Pain scale- 0 Opioid associated constipation- no  Review of systems- Review of Systems  Constitutional: Positive for malaise/fatigue.      Allergies  Allergen Reactions  . Ace Inhibitors Cough  . Beta Adrenergic Blockers Other (See Comments)  . Morphine And Related Nausea And Vomiting  . Peanut-Containing Drug Products Hives  . Codeine Nausea Only and Rash     Past Medical History:  Diagnosis Date  . Anemia   . Atrial fibrillation ([place])   . B12 deficiency   . Breast cancer ([place]) 2015   Right  . Breast cancer, right breast ([name])    right breast Invasive mammary carcinoma grade 1  . Cataracts, bilateral   . COPD (chronic obstructive pulmonary disease) ([place])   . Hemorrhoids   .  Lymphopenia   . Osteoarthritis   . Osteoporosis July 2015   seen on DEXA scan-T score of -2.9 in the left neck femur  . Pancytopenia ([place])   . Personal history of radiation therapy   . Skin cancer   . Spastic dysphonia   . Thrombocytopenia ([place])   . Unsteady gait   . Zenker's diverticulum      Past Surgical History:  Procedure Laterality Date  . BREAST EXCISIONAL BIOPSY Left    x 20 years surgical bx  . BREAST EXCISIONAL BIOPSY Right 2015   breast cancer   . BREAST EXCISIONAL BIOPSY Left 01/24/2016   ultrasound - Papaloma  . BREAST LUMPECTOMY WITH NEEDLE LOCALIZATION Left 03/12/2016   Procedure: BREAST LUMPECTOMY WITH NEEDLE LOCALIZATION;  Surgeon: Leonie Green, MD;  Location: [facility] ORS;  Service: General;  Laterality: Left;  . HYSTEROSCOPY W/D&C N/A 07/05/2016   Procedure: DILATATION AND CURETTAGE /HYSTEROSCOPY;  Surgeon: [name], MD;  Location: [facility] ORS;  Service: Gynecology;  Laterality: N/A;  . L1-L4 laminectomy and T6 fusion  May 4163   complicated by postoperative infection, more recently had recurrent pseudomonas infection at scar site March 2015  . REPLACEMENT TOTAL KNEE Bilateral   . ROTATOR CUFF REPAIR Bilateral   . ultrasound guided biopsy of breast  Jan 27, 2014    Social History   Socioeconomic History  . Marital status: Married    Spouse name: Not on file  . Number of children: Not on file  . Years of education:  Not on file  . Highest education level: Not on file  Occupational History  . Not on file  Social Needs  . Financial resource strain: Not on file  . Food insecurity:    Worry: Not on file    Inability: Not on file  . Transportation needs:    Medical: Not on file    Non-medical: Not on file  Tobacco Use  . Smoking status: Never Smoker  . Smokeless tobacco: Never Used  Substance and Sexual Activity  . Alcohol use: No    Alcohol/week: 0.0 oz  . Drug use: No  . Sexual activity: Never  Lifestyle  . Physical activity:    Days per  week: Not on file    Minutes per session: Not on file  . Stress: Not on file  Relationships  . Social connections:    Talks on phone: Not on file    Gets together: Not on file    Attends religious service: Not on file    Active member of club or organization: Not on file    Attends meetings of clubs or organizations: Not on file    Relationship status: Not on file  . Intimate partner violence:    Fear of current or ex partner: Not on file    Emotionally abused: Not on file    Physically abused: Not on file    Forced sexual activity: Not on file  Other Topics Concern  . Not on file  Social History Narrative  . Not on file    Family History  Problem Relation Age of Onset  . Throat cancer Mother   . Prostate cancer Brother   . Breast cancer Brother   . Leukemia Brother   . Lung cancer Father      Current Outpatient Medications:  .  ALPRAZolam (XANAX) 0.5 MG tablet, Take 0.25 mg by mouth at bedtime., Disp: , Rfl:  .  aspirin 81 MG chewable tablet, Chew 81 mg by mouth daily. , Disp: , Rfl:  .  Calcium Carbonate-Vit D-Min (CALCIUM 600+D PLUS MINERALS) 600-400 MG-UNIT CHEW, Chew 1 Dose by mouth daily. , Disp: , Rfl:  .  Cholecalciferol 2000 units CAPS, Take 1 capsule by mouth daily., Disp: , Rfl:  .  Cyanocobalamin (RA VITAMIN B-12 TR) 1000 MCG TBCR, Take 1,000 mcg by mouth daily. , Disp: , Rfl:  .  fexofenadine (ALLEGRA) 180 MG tablet, Take 180 mg by mouth daily., Disp: , Rfl:  .  potassium chloride (K-DUR) 10 MEQ tablet, Take 10 mEq by mouth daily as needed., Disp: , Rfl:  .  amiodarone (PACERONE) 200 MG tablet, Take 300 mg by mouth daily., Disp: , Rfl:  .  amoxicillin (AMOXIL) 500 MG capsule, 1,000 mg. Prior to dental procedures, Disp: , Rfl:  .  anastrozole (ARIMIDEX) 1 MG tablet, Take 1 tablet (1 mg total) by mouth daily. (Patient not taking: Reported on 10/21/2016), Disp: 30 tablet, Rfl: 11 .  bisacodyl (DULCOLAX) 5 MG EC tablet, Take 10 mg by mouth at bedtime. , Disp: , Rfl:   .  ciprofloxacin (CIPRO) 500 MG tablet, Take 500 mg by mouth 2 (two) times daily. , Disp: , Rfl:  .  denosumab (PROLIA) 60 MG/ML SOLN injection, Inject into the skin., Disp: , Rfl:  .  docusate sodium (COLACE) 100 MG capsule, Take 1 capsule (100 mg total) by mouth daily as needed for mild constipation. (Patient not taking: Reported on 04/28/2018), Disp: 60 capsule, Rfl: 3 .  furosemide (LASIX) 40  MG tablet, Take 40 mg by mouth daily as needed. , Disp: , Rfl:  .  ondansetron (ZOFRAN ODT) 4 MG disintegrating tablet, Take 1 tablet (4 mg total) by mouth every 8 (eight) hours as needed for nausea or vomiting. (Patient not taking: Reported on 07/22/2016), Disp: 20 tablet, Rfl: 0 .  warfarin (COUMADIN) 5 MG tablet, Take 2.5 mg by mouth daily at 6 PM. Alternates with 47m tablet i.e.6, 2.5,6.2.5,6,2.5, Disp: , Rfl:  .  warfarin (COUMADIN) 6 MG tablet, Take 6 mg by mouth daily at 6 PM. Alternates with 598mtablet i.e. 6, 2.5, 6, 2.5, Disp: , Rfl:   Physical exam:  Vitals:   04/28/18 1056  BP: (!) 160/84  Pulse: 61  Resp: 18  Temp: 97.8 F (36.6 C)  TempSrc: Tympanic  SpO2: 95%  Weight: 132 lb 1.6 oz (59.9 kg)  Height: _0  (1.6 m)   Physical Exam  Constitutional: She is oriented to person, place, and time.  Thin elderly female who ambulates with a cane. Does not appear to be in acute distress  HENT:  Head: Normocephalic and atraumatic.  Eyes: Pupils are equal, round, and reactive to light. EOM are normal.  Neck: Normal range of motion.  Cardiovascular: Normal rate, regular rhythm and normal heart sounds.  Pulmonary/Chest: Effort normal and breath sounds normal.  Abdominal: Soft. Bowel sounds are normal.  Musculoskeletal: She exhibits edema (there is diffuse thickening and hyperpigmentation of b/l LE from chronic lymphedema).  Neurological: She is alert and oriented to person, place, and time.  Skin: Skin is warm and dry.   Breast exam was performed in seated and lying down position. Patient  is status post right lumpectomy with a well-healed surgical scar. No evidence of any palpable masses. No evidence of axillary adenopathy. No evidence of any palpable masses or lumps in the left breast. No evidence of leftt axillary adenopathy   CMP Latest Ref Rng & Units 04/28/2018  Glucose 70 - 99 mg/dL 96  BUN 8 - 23 mg/dL 25(H)  Creatinine 0.44 - 1.00 mg/dL 1.54(H)  Sodium 135 - 145 mmol/L 140  Potassium 3.5 - 5.1 mmol/L 3.0(L)  Chloride 98 - 111 mmol/L 106  CO2 22 - 32 mmol/L 25  Calcium 8.9 - 10.3 mg/dL 8.8(L)  Total Protein 6.5 - 8.1 g/dL -  Total Bilirubin 0.3 - 1.2 mg/dL -  Alkaline Phos 38 - 126 U/L -  AST 15 - 41 U/L -  ALT 14 - 54 U/L -   CBC Latest Ref Rng & Units 01/05/2018  WBC 3.6 - 11.0 K/uL 3.9  Hemoglobin 12.0 - 16.0 g/dL 13.6  Hematocrit 35.0 - 47.0 % 39.8  Platelets 150 - 440 K/uL 187      Assessment and plan- Patient is a [age].o. female h/o hormone positive right breast cancer s/p lumpectomy and RT. Hormone therapy stopped after 3 years due to intolerance. She is here for routine f/u  1. Breast cancer- no evidence of recurrence on todays exam. Recent mammogram April 2019 showed no malignancy  2. Osteoporosis- continue calcium and vit D. Hold off on prolia given her age and frailty  3. AKI- kidney functions are gradually worsening. We will inform Dr. MiSabra Heckbout this  4. Hypokalemia: patient took her diuretic but did not take PO potassium. I have asked her to take oral potassium for next 2-3 days.   I will see her back in 6 months. No labs   Visit Diagnosis 1. Encounter for follow-up surveillance of  breast cancer   2. Hypokalemia      Dr. Randa Evens, MD, MPH [facility] at [facility] [phone] 04/28/2018 3:25 PM

## 2018-04-28 NOTE — Progress Notes (Signed)
No new changes noted today 

## 2018-05-12 DIAGNOSIS — I482 Chronic atrial fibrillation: Secondary | ICD-10-CM | POA: Diagnosis not present

## 2018-05-12 DIAGNOSIS — E782 Mixed hyperlipidemia: Secondary | ICD-10-CM | POA: Diagnosis not present

## 2018-05-19 DIAGNOSIS — I482 Chronic atrial fibrillation: Secondary | ICD-10-CM | POA: Diagnosis not present

## 2018-05-19 DIAGNOSIS — Z79899 Other long term (current) drug therapy: Secondary | ICD-10-CM | POA: Diagnosis not present

## 2018-05-19 DIAGNOSIS — E538 Deficiency of other specified B group vitamins: Secondary | ICD-10-CM | POA: Diagnosis not present

## 2018-06-10 DIAGNOSIS — L820 Inflamed seborrheic keratosis: Secondary | ICD-10-CM | POA: Diagnosis not present

## 2018-06-10 DIAGNOSIS — I8310 Varicose veins of unspecified lower extremity with inflammation: Secondary | ICD-10-CM | POA: Diagnosis not present

## 2018-06-10 DIAGNOSIS — L570 Actinic keratosis: Secondary | ICD-10-CM | POA: Diagnosis not present

## 2018-06-10 DIAGNOSIS — D692 Other nonthrombocytopenic purpura: Secondary | ICD-10-CM | POA: Diagnosis not present

## 2018-06-10 DIAGNOSIS — L578 Other skin changes due to chronic exposure to nonionizing radiation: Secondary | ICD-10-CM | POA: Diagnosis not present

## 2018-06-10 DIAGNOSIS — Z85828 Personal history of other malignant neoplasm of skin: Secondary | ICD-10-CM | POA: Diagnosis not present

## 2018-06-16 DIAGNOSIS — I482 Chronic atrial fibrillation: Secondary | ICD-10-CM | POA: Diagnosis not present

## 2018-06-30 DIAGNOSIS — R5382 Chronic fatigue, unspecified: Secondary | ICD-10-CM | POA: Diagnosis not present

## 2018-06-30 DIAGNOSIS — N183 Chronic kidney disease, stage 3 (moderate): Secondary | ICD-10-CM | POA: Diagnosis not present

## 2018-06-30 DIAGNOSIS — I4821 Permanent atrial fibrillation: Secondary | ICD-10-CM | POA: Diagnosis not present

## 2018-06-30 DIAGNOSIS — I4819 Other persistent atrial fibrillation: Secondary | ICD-10-CM | POA: Diagnosis not present

## 2018-07-27 DIAGNOSIS — I4821 Permanent atrial fibrillation: Secondary | ICD-10-CM | POA: Diagnosis not present

## 2018-08-25 DIAGNOSIS — I4821 Permanent atrial fibrillation: Secondary | ICD-10-CM | POA: Diagnosis not present

## 2018-09-01 DIAGNOSIS — F3341 Major depressive disorder, recurrent, in partial remission: Secondary | ICD-10-CM | POA: Insufficient documentation

## 2018-09-01 DIAGNOSIS — N183 Chronic kidney disease, stage 3 (moderate): Secondary | ICD-10-CM | POA: Diagnosis not present

## 2018-09-01 DIAGNOSIS — R5382 Chronic fatigue, unspecified: Secondary | ICD-10-CM | POA: Diagnosis not present

## 2018-09-01 DIAGNOSIS — I739 Peripheral vascular disease, unspecified: Secondary | ICD-10-CM | POA: Diagnosis not present

## 2018-09-09 DIAGNOSIS — N1831 Chronic kidney disease, stage 3a: Secondary | ICD-10-CM | POA: Insufficient documentation

## 2018-09-09 DIAGNOSIS — N183 Chronic kidney disease, stage 3 unspecified: Secondary | ICD-10-CM | POA: Insufficient documentation

## 2018-09-09 DIAGNOSIS — I739 Peripheral vascular disease, unspecified: Secondary | ICD-10-CM | POA: Diagnosis present

## 2018-10-12 DIAGNOSIS — I4821 Permanent atrial fibrillation: Secondary | ICD-10-CM | POA: Diagnosis not present

## 2018-10-13 DIAGNOSIS — E782 Mixed hyperlipidemia: Secondary | ICD-10-CM | POA: Diagnosis not present

## 2018-10-13 DIAGNOSIS — J383 Other diseases of vocal cords: Secondary | ICD-10-CM | POA: Diagnosis not present

## 2018-10-13 DIAGNOSIS — I4819 Other persistent atrial fibrillation: Secondary | ICD-10-CM | POA: Diagnosis not present

## 2018-10-13 DIAGNOSIS — I10 Essential (primary) hypertension: Secondary | ICD-10-CM | POA: Diagnosis not present

## 2018-10-29 ENCOUNTER — Inpatient Hospital Stay: Payer: PPO | Attending: Oncology | Admitting: Oncology

## 2018-11-16 DIAGNOSIS — E538 Deficiency of other specified B group vitamins: Secondary | ICD-10-CM | POA: Diagnosis not present

## 2018-11-16 DIAGNOSIS — I482 Chronic atrial fibrillation, unspecified: Secondary | ICD-10-CM | POA: Diagnosis not present

## 2018-11-23 DIAGNOSIS — I10 Essential (primary) hypertension: Secondary | ICD-10-CM | POA: Diagnosis not present

## 2018-11-23 DIAGNOSIS — W19XXXA Unspecified fall, initial encounter: Secondary | ICD-10-CM | POA: Diagnosis not present

## 2018-11-23 DIAGNOSIS — R58 Hemorrhage, not elsewhere classified: Secondary | ICD-10-CM | POA: Diagnosis not present

## 2018-11-24 ENCOUNTER — Emergency Department: Payer: PPO

## 2018-11-24 ENCOUNTER — Emergency Department
Admission: EM | Admit: 2018-11-24 | Discharge: 2018-11-24 | Disposition: A | Discharge status: Home / Self Care | Payer: PPO | Attending: Emergency Medicine | Admitting: Emergency Medicine

## 2018-11-24 DIAGNOSIS — J449 Chronic obstructive pulmonary disease, unspecified: Secondary | ICD-10-CM | POA: Diagnosis not present

## 2018-11-24 DIAGNOSIS — Z96652 Presence of left artificial knee joint: Secondary | ICD-10-CM | POA: Diagnosis not present

## 2018-11-24 DIAGNOSIS — Z85828 Personal history of other malignant neoplasm of skin: Secondary | ICD-10-CM | POA: Diagnosis not present

## 2018-11-24 DIAGNOSIS — Y92019 Unspecified place in single-family (private) house as the place of occurrence of the external cause: Secondary | ICD-10-CM | POA: Insufficient documentation

## 2018-11-24 DIAGNOSIS — S42021A Displaced fracture of shaft of right clavicle, initial encounter for closed fracture: Secondary | ICD-10-CM | POA: Diagnosis not present

## 2018-11-24 DIAGNOSIS — W01198A Fall on same level from slipping, tripping and stumbling with subsequent striking against other object, initial encounter: Secondary | ICD-10-CM | POA: Diagnosis not present

## 2018-11-24 DIAGNOSIS — Y998 Other external cause status: Secondary | ICD-10-CM | POA: Insufficient documentation

## 2018-11-24 DIAGNOSIS — S0030XA Unspecified superficial injury of nose, initial encounter: Secondary | ICD-10-CM | POA: Diagnosis not present

## 2018-11-24 DIAGNOSIS — S0990XA Unspecified injury of head, initial encounter: Secondary | ICD-10-CM | POA: Diagnosis present

## 2018-11-24 DIAGNOSIS — Y9301 Activity, walking, marching and hiking: Secondary | ICD-10-CM | POA: Insufficient documentation

## 2018-11-24 DIAGNOSIS — Z853 Personal history of malignant neoplasm of breast: Secondary | ICD-10-CM | POA: Diagnosis not present

## 2018-11-24 DIAGNOSIS — Z96651 Presence of right artificial knee joint: Secondary | ICD-10-CM | POA: Insufficient documentation

## 2018-11-24 DIAGNOSIS — S0101XA Laceration without foreign body of scalp, initial encounter: Secondary | ICD-10-CM

## 2018-11-24 DIAGNOSIS — Z79899 Other long term (current) drug therapy: Secondary | ICD-10-CM | POA: Diagnosis not present

## 2018-11-24 DIAGNOSIS — Z91010 Allergy to peanuts: Secondary | ICD-10-CM | POA: Insufficient documentation

## 2018-11-24 DIAGNOSIS — Z923 Personal history of irradiation: Secondary | ICD-10-CM | POA: Insufficient documentation

## 2018-11-24 DIAGNOSIS — S42024A Nondisplaced fracture of shaft of right clavicle, initial encounter for closed fracture: Secondary | ICD-10-CM | POA: Diagnosis not present

## 2018-11-24 DIAGNOSIS — S42031A Displaced fracture of lateral end of right clavicle, initial encounter for closed fracture: Secondary | ICD-10-CM | POA: Diagnosis not present

## 2018-11-24 MED ORDER — LIDOCAINE HCL (PF) 1 % IJ SOLN
INTRAMUSCULAR | Status: AC
  Administered 2018-11-24: 5 mL via INTRADERMAL
  Filled 2018-11-24: qty 5

## 2018-11-24 MED ORDER — ONDANSETRON HCL 4 MG/2ML IJ SOLN
4.0000 mg | Freq: Once | INTRAMUSCULAR | Status: AC
  Administered 2018-11-24: 4 mg via INTRAVENOUS
  Filled 2018-11-24: qty 2

## 2018-11-24 MED ORDER — MORPHINE SULFATE (PF) 2 MG/ML IV SOLN
2.0000 mg | Freq: Once | INTRAVENOUS | Status: AC
  Administered 2018-11-24: 2 mg via INTRAVENOUS
  Filled 2018-11-24: qty 1

## 2018-11-24 MED ORDER — OXYCODONE-ACETAMINOPHEN 5-325 MG PO TABS: 1.0000 | ORAL_TABLET | ORAL | 0 refills | Status: AC | PRN

## 2018-11-24 MED ORDER — LIDOCAINE HCL (PF) 1 % IJ SOLN
5.0000 mL | Freq: Once | INTRAMUSCULAR | Status: AC
  Administered 2018-11-24: 5 mL via INTRADERMAL

## 2018-11-24 NOTE — ED Triage Notes (Signed)
Pt arrived from home via EMS with complaints of a mechanical fall. Pt states that she tripped over her Crocs and fell. Pt states no LOC. Pt is alert and oriented x 4. Pt lives alone. Pt has Hx of A-fib and two rotator cuff surgeries in the past. VS per EMS BP-170/69. Pt has laceration in head on right side and right shoulder pain. Dr. Owens Shark at bedside

## 2018-11-24 NOTE — ED Provider Notes (Signed)
[facility] Emergency Department Provider Note ___________________________   First MD Initiated Contact with Patient 11/24/18 0017     (approximate)  I have reviewed the triage vital signs and the nursing notes.   HISTORY  Chief Complaint No chief complaint on file.    HPI Kelly Bishop is a 85 y.o. female with below list of previous medical conditions presents to the emergency department following accidental trip and fall resultant right temporal head injury/laceration.  Patient denies any loss of consciousness.  Patient states that she tripped on her crocs while going to the restroom resulting in the fall.  Patient also admits to right shoulder pain.        Past Medical History:  Diagnosis Date  . Anemia   . Atrial fibrillation ([place])   . B12 deficiency   . Breast cancer ([place]) 2015   Right  . Breast cancer, right breast ([name])    right breast Invasive mammary carcinoma grade 1  . Cataracts, bilateral   . COPD (chronic obstructive pulmonary disease) ([place])   . Hemorrhoids   . Lymphopenia   . Osteoarthritis   . Osteoporosis July 2015   seen on DEXA scan-T score of -2.9 in the left neck femur  . Pancytopenia ([place])   . Personal history of radiation therapy   . Skin cancer   . Spastic dysphonia   . Thrombocytopenia ([place])   . Unsteady gait   . Zenker's diverticulum     Patient Active Problem List   Diagnosis Date Noted  . Osteoporosis 10/21/2016  . Carcinoma of overlapping sites of right breast in female, estrogen receptor positive ([place]) 07/22/2016    Past Surgical History:  Procedure Laterality Date  . BREAST EXCISIONAL BIOPSY Left    x 20 years surgical bx  . BREAST EXCISIONAL BIOPSY Right 2015   breast cancer   . BREAST EXCISIONAL BIOPSY Left 01/24/2016   ultrasound - Papaloma  . BREAST LUMPECTOMY WITH NEEDLE LOCALIZATION Left 03/12/2016   Procedure: BREAST LUMPECTOMY WITH NEEDLE LOCALIZATION;  Surgeon: Leonie Green, MD;   Location: [facility] ORS;  Service: General;  Laterality: Left;  . HYSTEROSCOPY W/D&C N/A 07/05/2016   Procedure: DILATATION AND CURETTAGE /HYSTEROSCOPY;  Surgeon: [name], MD;  Location: [facility] ORS;  Service: Gynecology;  Laterality: N/A;  . L1-L4 laminectomy and T6 fusion  May 5956   complicated by postoperative infection, more recently had recurrent pseudomonas infection at scar site March 2015  . REPLACEMENT TOTAL KNEE Bilateral   . ROTATOR CUFF REPAIR Bilateral   . ultrasound guided biopsy of breast  Jan 27, 2014    Prior to Admission medications   Medication Sig Start Date End Date Taking? Authorizing Provider  ALPRAZolam Duanne Moron) 0.5 MG tablet Take 0.25 mg by mouth at bedtime.    [provider]  amiodarone (PACERONE) 200 MG tablet Take 300 mg by mouth daily. 10/02/16 10/23/17  [provider]  amoxicillin (AMOXIL) 500 MG capsule 1,000 mg. Prior to dental procedures 04/04/17   [provider]  anastrozole (ARIMIDEX) 1 MG tablet Take 1 tablet (1 mg total) by mouth daily. Patient not taking: Reported on 10/21/2016 07/22/16   Cammie Sickle, MD  aspirin 81 MG chewable tablet Chew 81 mg by mouth daily.     [provider]  bisacodyl (DULCOLAX) 5 MG EC tablet Take 10 mg by mouth at bedtime.     [provider]  Calcium Carbonate-Vit D-Min (CALCIUM 600+D PLUS MINERALS) 600-400 MG-UNIT CHEW Chew 1  Dose by mouth daily.     [provider]  Cholecalciferol 2000 units CAPS Take 1 capsule by mouth daily.    [provider]  ciprofloxacin (CIPRO) 500 MG tablet Take 500 mg by mouth 2 (two) times daily.     [provider]  Cyanocobalamin (RA VITAMIN B-12 TR) 1000 MCG TBCR Take 1,000 mcg by mouth daily.     [provider]  denosumab (PROLIA) 60 MG/ML SOLN injection Inject into the skin.    [provider]  docusate sodium (COLACE) 100 MG capsule Take 1 capsule (100 mg total) by mouth daily as needed for mild  constipation. Patient not taking: Reported on 04/28/2018 07/05/16   [name], MD  fexofenadine (ALLEGRA) 180 MG tablet Take 180 mg by mouth daily.    [provider]  furosemide (LASIX) 40 MG tablet Take 40 mg by mouth daily as needed.     [provider]  ondansetron (ZOFRAN ODT) 4 MG disintegrating tablet Take 1 tablet (4 mg total) by mouth every 8 (eight) hours as needed for nausea or vomiting. Patient not taking: Reported on 07/22/2016 07/05/16   [name], MD  oxyCODONE-acetaminophen (PERCOCET) 5-325 MG tablet Take 1 tablet by mouth every 4 (four) hours as needed. 11/24/18 11/24/19  Gregor Hams, MD  potassium chloride (K-DUR) 10 MEQ tablet Take 10 mEq by mouth daily as needed.    [provider]  warfarin (COUMADIN) 5 MG tablet Take 2.5 mg by mouth daily at 6 PM. Alternates with 6mg  tablet i.e.6, 2.5,6.2.5,6,2.5 05/05/15 10/23/17  [provider]  warfarin (COUMADIN) 6 MG tablet Take 6 mg by mouth daily at 6 PM. Alternates with 5mg  tablet i.e. 6, 2.5, 6, 2.5 05/11/15 04/22/17  [provider]    Allergies Ace inhibitors; Beta adrenergic blockers; Morphine and related; Peanut-containing drug products; and Codeine  Family History  Problem Relation Age of Onset  . Throat cancer Mother   . Prostate cancer Brother   . Breast cancer Brother   . Leukemia Brother   . Lung cancer Father     Social History Social History   Tobacco Use  . Smoking status: Never Smoker  . Smokeless tobacco: Never Used  Substance Use Topics  . Alcohol use: No    Alcohol/week: 0.0 standard drinks  . Drug use: No    Review of Systems Constitutional: No fever/chills Eyes: No visual changes. ENT: No sore throat. Cardiovascular: Denies chest pain. Respiratory: Denies shortness of breath. Gastrointestinal: No abdominal pain.  No nausea, no vomiting.  No diarrhea.  No constipation. Genitourinary: Negative for dysuria. Musculoskeletal: Negative for  neck pain.  Negative for back pain.  Positive for right shoulder pain Integumentary: Positive for a laceration to the right temple Neurological: Negative for headaches, focal weakness or numbness.   ____________________________________________   PHYSICAL EXAM:  VITAL SIGNS: ED Triage Vitals  Enc Vitals Group     BP 11/24/18 0018 (!) 176/69     Pulse Rate 11/24/18 0018 66     Resp 11/24/18 0018 14     Temp 11/24/18 0018 98.6 F (37 C)     Temp Source 11/24/18 0018 Oral     SpO2 11/24/18 0018 97 %     Weight 11/24/18 0020 55.8 kg (123 lb)     Height 11/24/18 0020 1.6 m (5\' 3" )     Head Circumference --      Peak Flow --      Pain Score 11/24/18 0020 4  Pain Loc --      Pain Edu? --      Excl. in [place]? --     Constitutional: Alert and oriented. Well appearing and in no acute distress. Eyes: Conjunctivae are normal. PERRL. EOMI. Head: 2 cm linear laceration right temporal region Nose: No congestion/rhinnorhea. Mouth/Throat: Mucous membranes are moist. Neck: No stridor.  No cervical spine tenderness to palpation. Cardiovascular: Normal rate, regular rhythm. Good peripheral circulation. Grossly normal heart sounds. Respiratory: Normal respiratory effort.  No retractions. Lungs CTAB. Gastrointestinal: Soft and nontender. No distention.  Musculoskeletal: Pain to palpation right distal clavicle no lower extremity tenderness nor edema. No gross deformities of extremities. Neurologic:  Normal speech and language. No gross focal neurologic deficits are appreciated.  Skin: 2 cm linear right temporal gallop laceration Psychiatric: Mood and affect are normal. Speech and behavior are normal.    RADIOLOGY I, [place] N BROWN, personally viewed and evaluated these images (plain radiographs) as part of my medical decision making, as well as reviewing the written report by the radiologist.  ED MD interpretation: Acute mildly displaced fracture of the right distal clavicle.  CT head  revealed no acute intracranial abnormality.  Official radiology report(s): Dg Shoulder Right  Result Date: 11/24/2018 CLINICAL DATA:  Shoulder pain EXAM: RIGHT SHOULDER - 2+ VIEW COMPARISON:  09/01/2007 MRI FINDINGS: Humeral head alignment is normal. Acute fracture distal right clavicle with about 1/2 bone with superior displacement of distal fracture fragment. Mild AC joint degenerative changes. IMPRESSION: Acute mildly displaced fracture of the distal right clavicle Electronically Signed   By: Donavan Foil M.D.   On: 11/24/2018 01:10   Ct Head Wo Contrast  Result Date: 11/24/2018 CLINICAL DATA:  Mechanical fall EXAM: CT HEAD WITHOUT CONTRAST TECHNIQUE: Contiguous axial images were obtained from the base of the skull through the vertex without intravenous contrast. COMPARISON:  CT brain 03/17/2016 FINDINGS: Brain: No acute territorial infarction, hemorrhage, or intracranial mass. Mild atrophy and small vessel ischemic changes of the white matter. Normal ventricle size. Vascular: No hyperdense vessels.  Carotid vascular calcification. Skull: No fracture Sinuses/Orbits: No acute finding. Other: Moderate right parietal scalp hematoma IMPRESSION: 1. No CT evidence for acute intracranial abnormality. 2. Atrophy with mild small vessel ischemic changes of the white matter 3. Moderate right parietal scalp hematoma Electronically Signed   By: Donavan Foil M.D.   On: 11/24/2018 01:14    _________  .Marland KitchenLaceration Repair Date/Time: 11/24/2018 10:31 PM Performed by: Gregor Hams, MD Authorized by: Gregor Hams, MD   Consent:    Consent obtained:  Verbal   Consent given by:  Patient   Risks discussed:  Infection, pain, retained foreign body, poor cosmetic result and poor wound healing Anesthesia (see MAR for exact dosages):    Anesthesia method:  Local infiltration   Local anesthetic:  Lidocaine 1% w/o epi Laceration details:    Location:  Scalp   Scalp location:  R temporal   Length (cm):   2 Repair type:    Repair type:  Simple Exploration:    Hemostasis achieved with:  Direct pressure   Wound exploration: entire depth of wound probed and visualized     Contaminated: no   Treatment:    Area cleansed with:  Saline   Amount of cleaning:  Extensive   Irrigation solution:  Sterile saline   Visualized foreign bodies/material removed: no   Skin repair:    Repair method:  Staples   Number of staples:  3 Approximation:    Approximation:  Close Post-procedure details:    Dressing:  Sterile dressing   Patient tolerance of procedure:  Tolerated well, no immediate complications     ____________________________________________   INITIAL IMPRESSION / MDM / ASSESSMENT AND PLAN / ED COURSE  As part of my medical decision making, I reviewed the following data within the [place] NUMBER   85 year old female presented with above-stated history and physical exam secondary to fall with right temporal scalp laceration and right shoulder discomfort.  CT head performed to evaluate for intracranial abnormality especially in the setting of anticoagulation therapy.  CT revealed no intracranial abnormality.  Patient scalp laceration repaired without difficulty.  Regarding the patient's right shoulder pain x-ray consistent with distal right clavicle fracture and as such sling applied.  Patient given IV morphine 2 mg on arrival to the emergency department with improvement of pain at this time.    ____________________________________________  FINAL CLINICAL IMPRESSION(S) / ED DIAGNOSES  Final diagnoses:  Scalp laceration, initial encounter  Closed nondisplaced fracture of shaft of right clavicle, initial encounter     MEDICATIONS GIVEN DURING THIS VISIT:  Medications  morphine 2 MG/ML injection 2 mg (2 mg Intravenous Given 11/24/18 0053)  ondansetron (ZOFRAN) injection 4 mg (4 mg Intravenous Given 11/24/18 0053)  lidocaine (PF) (XYLOCAINE) 1 % injection 5 mL (5 mLs  Intradermal Given by Other 11/24/18 0309)     ED Discharge Orders         Ordered    oxyCODONE-acetaminophen (PERCOCET) 5-325 MG tablet  Every 4 hours PRN     11/24/18 0314           Note:  This document was prepared using Dragon voice recognition software and may include unintentional dictation errors.   Gregor Hams, MD 11/24/18 2232

## 2018-12-02 ENCOUNTER — Other Ambulatory Visit: Payer: Self-pay | Admitting: *Deleted

## 2018-12-02 DIAGNOSIS — M25551 Pain in right hip: Secondary | ICD-10-CM | POA: Diagnosis not present

## 2018-12-02 DIAGNOSIS — S0003XA Contusion of scalp, initial encounter: Secondary | ICD-10-CM | POA: Diagnosis not present

## 2018-12-02 DIAGNOSIS — M1611 Unilateral primary osteoarthritis, right hip: Secondary | ICD-10-CM | POA: Diagnosis not present

## 2018-12-02 DIAGNOSIS — S199XXA Unspecified injury of neck, initial encounter: Secondary | ICD-10-CM | POA: Diagnosis not present

## 2018-12-02 DIAGNOSIS — M542 Cervicalgia: Secondary | ICD-10-CM | POA: Diagnosis not present

## 2018-12-02 DIAGNOSIS — W010XXA Fall on same level from slipping, tripping and stumbling without subsequent striking against object, initial encounter: Secondary | ICD-10-CM | POA: Diagnosis not present

## 2018-12-02 DIAGNOSIS — S42021A Displaced fracture of shaft of right clavicle, initial encounter for closed fracture: Secondary | ICD-10-CM | POA: Diagnosis not present

## 2018-12-02 NOTE — Patient Outreach (Addendum)
[facility]) Care Management  12/02/2018  JEHIELI BRASSELL 12-Oct-1931 [id number]   Telephone Screen  Referral Date: 11/27/2018  Referral Source: UM referral  Referral Reason: "85 year old lives alone. Does have sisters who she can call to assist her if needed. She was seen in ER on 11/24/18 accidental trip and fall resultant right temporal head injury/laceration.  Patient states that she tripped on her crocs while going to the restroom resulting in the fall. Staples were placed for laceration on head. She does have a closed nondisplaced fracture of shaft of right clavicle. Due to recent injury she is having a hard time with ADLs. She is not comfortable taking a shower and needs a shower chair. I have reached out to her pcp requesting a shower chair and bathing assistance while she is injured.  She would like assistance on getting a life alert.  She is aware that she will have to pay for it.  Pleas follow up with any resources that could assist her  Insurance: HTADaleen Snook 11/25/18 ED visit 11/24/18   Outreach attempt # 1 No answer. [facility] RN CM left HIPAA compliant voicemail message along with CM's contact info.   CM called her MD office and spoke with Jenny Reichmann who confirms she was seen today 12/02/18 by Dr Emily Filbert and he is assisting with a shower chair [place] sent a message that pt is also needing life alert resource  Plan: [facility] RN CM sent an unsuccessful outreach letter and scheduled this patient for another call attempt within 4 business days   Kimberly L. Lavina Hamman, RN, BSN, [place] Coordinator Office number [phone] Mobile number [phone]  Main [facility] number [phone] Fax number [phone]

## 2018-12-03 ENCOUNTER — Ambulatory Visit: Payer: Self-pay | Admitting: *Deleted

## 2018-12-04 ENCOUNTER — Ambulatory Visit: Payer: Self-pay | Admitting: *Deleted

## 2018-12-07 ENCOUNTER — Other Ambulatory Visit: Payer: Self-pay | Admitting: *Deleted

## 2018-12-07 ENCOUNTER — Other Ambulatory Visit: Payer: Self-pay

## 2018-12-07 NOTE — Patient Outreach (Signed)
[facility]) Care Management  12/07/2018  Kelly Bishop 09/27/1931 [id number]   Telephone Screen  Referral Date: 11/27/2018  Referral Source: UM referral  Referral Reason: "85 year old lives alone. Does have sisters who she can call to assist her if needed. She was seen in ER on 11/24/18 accidental trip and fall resultant right temporal head injury/laceration.  Patient states that she tripped on her crocs while going to the restroom resulting in the fall. Staples were placed for laceration on head. She does have a closed nondisplaced fracture of shaft of right clavicle. Due to recent injury she is having a hard time with ADLs. She is not comfortable taking a shower and needs a shower chair. I have reached out to her pcp requesting a shower chair and bathing assistance while she is injured.  She would like assistance on getting a life alert.  She is aware that she will have to pay for it.  Please follow up with any resources that could assist her  Insurance: HTADaleen Bishop 11/25/18 ED visit 11/24/18   Outreach attempt # 2 successful to her home number   Patient is able to verify HIPAA Reviewed and addressed UM HTA /referral to [facility] with patient  Kelly Bishop confirms information in the referral is correct She reports falling on "ceramic" She confirms she was seen by her primary care MD on 12/02/18 briefly and her staples were removed from her head and started on prednisone in which she is finishing. She reports they discussed her fall related to her shoes (crocs which she is no longer wearing) and pain she was having in her shoulder and hip. She reports x rays were taken but she has not heard results at this time. [facility] RN CM is unable to find 12/02/18 imaging results in Epic   On today Kelly Bishop informs CM she still has a "nagging headache" and "hip and shoulder still pain in hip with moment" CM assessed for dizziness, vision, etc and Kelly Bishop denies other s/s except a headache. CM  encouraged her to take her BP to evaluate any changes She states she had not thought of this but would check. CM reviewed hypotension and hypertension s/s to monitor and report to MD She voiced understanding    Fall assessment/interventions/prevention discussion- Discussed her risks- HTN, afib, past hx of falls, medications, home, being in pain She reports noting increase falls after her back surgery,  falls from shoes, after use of Effexor and when she has not used her cane. She notes dizziness or "fading" prior to falls. She reports 4 falls within the last 3 months and she points out that with 2 of the falls she had taken her Effexor prior to the 2 falls and some dizziness upon standing even at a slow pace. She reports taking the Effexor in the morning and "it wears off by five"  Discussed making the home safer related to rugs, clutter, sturdy shoes, balance exercises, possibly discussing with MD medication changes   She reports she prefers not to have further [facility] PT services as she did have PT services while at [facility] wood facility. She reports [facility]ob[facility] and trying to stay active in her home Her sister has given a her a walker to use. She reports not wanting to get "dependent on it" but CM encouraged her to use it for safety related to the report today of a headache, pain of the hip/shoulder and her hx of falls  until some of these concerns are resolved She voiced understanding  She confirms receiving her life alert system and there are 2 females scheduled to visit her to discuss the use this Thursday December 10 2018 She also reports she is to call "Joycelyn Schmid" to provider the names of her 2 emergency contact names    [facility] RN CM spoke with Kelly Bishop at Dr Sabra Heck office to request Kelly Bishop be called about the results of her imaging for her shoulder and hip CM also requested Dr Sabra Heck be made aware of continued headache, hip and shoulder pain plus possible concerns with use/dose of  Effexor pt reporting with 2 fall episodes  Social: Kelly Bishop is retired, lives at home alone at this time as her husband is at a memory care facility. She generally visits but is not doing so at this time related to corona virius regulations. She reports this causes her to have some sadness but "i'm not in crisis or anything"  She is touching basis via telephone She voices this helps him and her to mentally feel better.  She reports her two sisters live very close, checks on her 2-3 times a day and is always available prn She is independent in her care needs but her sisters do assist with iADLs and transportation prn    Conditions: HTN, depression GERD, hemorrhoids, hx of hip surgery (laminectomy at [facility] "years ago",  HDL, CKD GFR 30-59 hx of breast cancer   DME rolling walker, cane, shower chair, life alert   Medications: She denies concerns with taking medications as prescribed, affording medications, side effects of medications and questions about medications  Appointments:  Advance Directives: She has a living will and a POA (sister)   Consent: [facility] RN CM reviewed [facility] services with patient. Patient gave verbal consent for services. She denies need of services from [facility]/Telephonic RN CM, pharmacy, health coach, NP or SW at this time     Plan: [place] CM will close case at this time as patient has been assessed and no needs identified/needs resolved.   [facility] RN CM will check with her provider about imaging results and provide information assessed   Pt encouraged to return a call to [facility] RN CM and 24 hr nurse prn Numbers provided for CM and 24 hr HTA nurse line    Route note to MD  Joelene Millin L. Lavina Hamman, RN, BSN, [place] Coordinator Office number [phone] Mobile number [phone]  Main [facility] number [phone] Fax number [phone]

## 2018-12-21 DIAGNOSIS — I4821 Permanent atrial fibrillation: Secondary | ICD-10-CM | POA: Diagnosis not present

## 2019-01-21 DIAGNOSIS — I4821 Permanent atrial fibrillation: Secondary | ICD-10-CM | POA: Diagnosis not present

## 2019-02-18 DIAGNOSIS — H93299 Other abnormal auditory perceptions, unspecified ear: Secondary | ICD-10-CM | POA: Diagnosis not present

## 2019-02-18 DIAGNOSIS — H6123 Impacted cerumen, bilateral: Secondary | ICD-10-CM | POA: Diagnosis not present

## 2019-02-22 DIAGNOSIS — Z7901 Long term (current) use of anticoagulants: Secondary | ICD-10-CM | POA: Diagnosis not present

## 2019-02-22 DIAGNOSIS — Z5181 Encounter for therapeutic drug level monitoring: Secondary | ICD-10-CM | POA: Diagnosis not present

## 2019-03-03 DIAGNOSIS — D329 Benign neoplasm of meninges, unspecified: Secondary | ICD-10-CM | POA: Diagnosis not present

## 2019-03-03 DIAGNOSIS — Z0000 Encounter for general adult medical examination without abnormal findings: Secondary | ICD-10-CM | POA: Diagnosis not present

## 2019-03-03 DIAGNOSIS — E782 Mixed hyperlipidemia: Secondary | ICD-10-CM | POA: Diagnosis not present

## 2019-03-03 DIAGNOSIS — E538 Deficiency of other specified B group vitamins: Secondary | ICD-10-CM | POA: Diagnosis not present

## 2019-03-03 DIAGNOSIS — I739 Peripheral vascular disease, unspecified: Secondary | ICD-10-CM | POA: Diagnosis not present

## 2019-03-03 DIAGNOSIS — I482 Chronic atrial fibrillation, unspecified: Secondary | ICD-10-CM | POA: Diagnosis not present

## 2019-03-11 ENCOUNTER — Other Ambulatory Visit: Payer: Self-pay | Admitting: Internal Medicine

## 2019-03-11 DIAGNOSIS — Z853 Personal history of malignant neoplasm of breast: Secondary | ICD-10-CM

## 2019-03-25 ENCOUNTER — Ambulatory Visit
Admission: RE | Admit: 2019-03-25 | Discharge: 2019-03-25 | Disposition: A | Discharge status: Home / Self Care | Payer: PPO | Source: Ambulatory Visit | Attending: Internal Medicine | Admitting: Internal Medicine

## 2019-03-25 ENCOUNTER — Other Ambulatory Visit: Payer: Self-pay

## 2019-03-25 DIAGNOSIS — Z853 Personal history of malignant neoplasm of breast: Secondary | ICD-10-CM | POA: Diagnosis not present

## 2019-03-25 DIAGNOSIS — Z7901 Long term (current) use of anticoagulants: Secondary | ICD-10-CM | POA: Diagnosis not present

## 2019-03-25 DIAGNOSIS — R928 Other abnormal and inconclusive findings on diagnostic imaging of breast: Secondary | ICD-10-CM | POA: Diagnosis not present

## 2019-03-25 DIAGNOSIS — Z5181 Encounter for therapeutic drug level monitoring: Secondary | ICD-10-CM | POA: Diagnosis not present

## 2019-04-14 DIAGNOSIS — Z5181 Encounter for therapeutic drug level monitoring: Secondary | ICD-10-CM | POA: Diagnosis not present

## 2019-04-14 DIAGNOSIS — I10 Essential (primary) hypertension: Secondary | ICD-10-CM | POA: Diagnosis not present

## 2019-04-14 DIAGNOSIS — Z7901 Long term (current) use of anticoagulants: Secondary | ICD-10-CM | POA: Diagnosis not present

## 2019-04-14 DIAGNOSIS — I482 Chronic atrial fibrillation, unspecified: Secondary | ICD-10-CM | POA: Diagnosis not present

## 2019-04-14 DIAGNOSIS — N183 Chronic kidney disease, stage 3 (moderate): Secondary | ICD-10-CM | POA: Diagnosis not present

## 2019-04-14 DIAGNOSIS — I739 Peripheral vascular disease, unspecified: Secondary | ICD-10-CM | POA: Diagnosis not present

## 2019-04-14 DIAGNOSIS — E782 Mixed hyperlipidemia: Secondary | ICD-10-CM | POA: Diagnosis not present

## 2019-05-19 DIAGNOSIS — Z7901 Long term (current) use of anticoagulants: Secondary | ICD-10-CM | POA: Diagnosis not present

## 2019-05-19 DIAGNOSIS — Z5181 Encounter for therapeutic drug level monitoring: Secondary | ICD-10-CM | POA: Diagnosis not present

## 2019-06-07 ENCOUNTER — Other Ambulatory Visit: Payer: Self-pay

## 2019-06-07 DIAGNOSIS — Z20822 Contact with and (suspected) exposure to covid-19: Secondary | ICD-10-CM

## 2019-06-07 DIAGNOSIS — R6889 Other general symptoms and signs: Secondary | ICD-10-CM | POA: Diagnosis not present

## 2019-06-08 LAB — NOVEL CORONAVIRUS, NAA: SARS-CoV-2, NAA: NOT DETECTED

## 2019-06-22 DIAGNOSIS — Z5181 Encounter for therapeutic drug level monitoring: Secondary | ICD-10-CM | POA: Diagnosis not present

## 2019-06-22 DIAGNOSIS — Z7901 Long term (current) use of anticoagulants: Secondary | ICD-10-CM | POA: Diagnosis not present

## 2019-07-22 DIAGNOSIS — Z7901 Long term (current) use of anticoagulants: Secondary | ICD-10-CM | POA: Diagnosis not present

## 2019-07-22 DIAGNOSIS — Z5181 Encounter for therapeutic drug level monitoring: Secondary | ICD-10-CM | POA: Diagnosis not present

## 2019-07-26 DIAGNOSIS — C44729 Squamous cell carcinoma of skin of left lower limb, including hip: Secondary | ICD-10-CM | POA: Diagnosis not present

## 2019-08-10 DIAGNOSIS — Z5181 Encounter for therapeutic drug level monitoring: Secondary | ICD-10-CM | POA: Diagnosis not present

## 2019-08-10 DIAGNOSIS — Z7901 Long term (current) use of anticoagulants: Secondary | ICD-10-CM | POA: Diagnosis not present

## 2019-08-31 DIAGNOSIS — Z5181 Encounter for therapeutic drug level monitoring: Secondary | ICD-10-CM | POA: Diagnosis not present

## 2019-08-31 DIAGNOSIS — E782 Mixed hyperlipidemia: Secondary | ICD-10-CM | POA: Diagnosis not present

## 2019-08-31 DIAGNOSIS — Z7901 Long term (current) use of anticoagulants: Secondary | ICD-10-CM | POA: Diagnosis not present

## 2019-08-31 DIAGNOSIS — E538 Deficiency of other specified B group vitamins: Secondary | ICD-10-CM | POA: Diagnosis not present

## 2019-09-22 DIAGNOSIS — E782 Mixed hyperlipidemia: Secondary | ICD-10-CM | POA: Diagnosis not present

## 2019-09-22 DIAGNOSIS — E039 Hypothyroidism, unspecified: Secondary | ICD-10-CM | POA: Diagnosis not present

## 2019-09-22 DIAGNOSIS — F3341 Major depressive disorder, recurrent, in partial remission: Secondary | ICD-10-CM | POA: Diagnosis not present

## 2019-09-22 DIAGNOSIS — I480 Paroxysmal atrial fibrillation: Secondary | ICD-10-CM | POA: Diagnosis not present

## 2019-09-22 DIAGNOSIS — E538 Deficiency of other specified B group vitamins: Secondary | ICD-10-CM | POA: Diagnosis not present

## 2019-09-28 DIAGNOSIS — I480 Paroxysmal atrial fibrillation: Secondary | ICD-10-CM | POA: Diagnosis not present

## 2019-10-14 DIAGNOSIS — I10 Essential (primary) hypertension: Secondary | ICD-10-CM | POA: Diagnosis not present

## 2019-10-14 DIAGNOSIS — E782 Mixed hyperlipidemia: Secondary | ICD-10-CM | POA: Diagnosis not present

## 2019-10-14 DIAGNOSIS — I739 Peripheral vascular disease, unspecified: Secondary | ICD-10-CM | POA: Diagnosis not present

## 2019-10-14 DIAGNOSIS — I480 Paroxysmal atrial fibrillation: Secondary | ICD-10-CM | POA: Diagnosis not present

## 2019-10-28 DIAGNOSIS — Z5181 Encounter for therapeutic drug level monitoring: Secondary | ICD-10-CM | POA: Diagnosis not present

## 2019-10-28 DIAGNOSIS — Z7901 Long term (current) use of anticoagulants: Secondary | ICD-10-CM | POA: Diagnosis not present

## 2019-11-24 DIAGNOSIS — Z85828 Personal history of other malignant neoplasm of skin: Secondary | ICD-10-CM | POA: Diagnosis not present

## 2019-11-24 DIAGNOSIS — L820 Inflamed seborrheic keratosis: Secondary | ICD-10-CM | POA: Diagnosis not present

## 2019-11-24 DIAGNOSIS — C44319 Basal cell carcinoma of skin of other parts of face: Secondary | ICD-10-CM | POA: Diagnosis not present

## 2019-11-24 DIAGNOSIS — L570 Actinic keratosis: Secondary | ICD-10-CM | POA: Diagnosis not present

## 2019-11-25 DIAGNOSIS — Z7901 Long term (current) use of anticoagulants: Secondary | ICD-10-CM | POA: Diagnosis not present

## 2019-11-25 DIAGNOSIS — Z5181 Encounter for therapeutic drug level monitoring: Secondary | ICD-10-CM | POA: Diagnosis not present

## 2019-12-28 DIAGNOSIS — Z5181 Encounter for therapeutic drug level monitoring: Secondary | ICD-10-CM | POA: Diagnosis not present

## 2019-12-28 DIAGNOSIS — Z7901 Long term (current) use of anticoagulants: Secondary | ICD-10-CM | POA: Diagnosis not present

## 2020-01-13 ENCOUNTER — Telehealth: Payer: Self-pay

## 2020-01-13 NOTE — Telephone Encounter (Signed)
Stop Warfarin and any other blood thinners 4 days prior to surgery (Stop April 30). Please advise pt.

## 2020-01-13 NOTE — Telephone Encounter (Signed)
Pt called she has surgery scheduled here  01-25-2020, SCC on her forehead, pt report last surgery she had here she bleed through her sutures and had to come back in the office  because she was taking Warfarin, pt would like to know when should she stop Warfarin

## 2020-01-25 ENCOUNTER — Encounter: Payer: Self-pay | Admitting: Dermatology

## 2020-01-25 ENCOUNTER — Ambulatory Visit: Payer: PPO | Admitting: Dermatology

## 2020-01-25 ENCOUNTER — Other Ambulatory Visit: Payer: Self-pay

## 2020-01-25 DIAGNOSIS — C44319 Basal cell carcinoma of skin of other parts of face: Secondary | ICD-10-CM | POA: Diagnosis not present

## 2020-01-25 DIAGNOSIS — C4491 Basal cell carcinoma of skin, unspecified: Secondary | ICD-10-CM

## 2020-01-25 HISTORY — DX: Basal cell carcinoma of skin, unspecified: C44.91

## 2020-01-25 MED ORDER — MUPIROCIN 2 % EX OINT: 1.0000 "application " | TOPICAL_OINTMENT | Freq: Every day | CUTANEOUS | 0 refills | Status: DC

## 2020-01-25 NOTE — Patient Instructions (Signed)

## 2020-01-25 NOTE — Progress Notes (Signed)
   Follow-Up Visit   Subjective  Kelly Bishop is a 86 y.o. female who presents for the following: BCC bx proven (R forehead above lateral brow, bx 11/24/19).   The following portions of the chart were reviewed this encounter and updated as appropriate:  Tobacco  Allergies  Meds  Problems  Med Hx  Surg Hx  Fam Hx      Review of Systems:  No other skin or systemic complaints except as noted in HPI or Assessment and Plan.  Objective  Well appearing patient in no apparent distress; mood and affect are within normal limits.  A focused examination was performed including face. Relevant physical exam findings are noted in the Assessment and Plan.  Objective  R forehead above lateral brow: Pink bx site 1.0 x 0.7cm   Assessment & Plan  Basal cell carcinoma (BCC) of skin of other part of face R forehead above lateral brow  Skin excision  Lesion length (cm):  1 Lesion width (cm):  0.7 Margin per side (cm):  0.2 Total excision diameter (cm):  1.4 Informed consent: discussed and consent obtained   Timeout: patient name, date of birth, surgical site, and procedure verified   Procedure prep:  Patient was prepped and draped in usual sterile fashion Prep type:  Isopropyl alcohol and povidone-iodine Anesthesia: the lesion was anesthetized in a standard fashion   Anesthesia comment:  5cc Anesthetic:  1% lidocaine w/ epinephrine 1-100,000 buffered w/ 8.4% NaHCO3 Instrument used comment:  15c blade Hemostasis achieved with: pressure   Hemostasis achieved with comment:  Electrocautery Outcome: patient tolerated procedure well with no complications   Post-procedure details: sterile dressing applied and wound care instructions given   Dressing type: bandage and pressure dressing (Mupirocin)    Skin repair Complexity:  Complex Final length (cm):  3 Reason for type of repair: reduce tension to allow closure, reduce the risk of dehiscence, infection, and necrosis, reduce  subcutaneous dead space and avoid a hematoma, allow closure of the large defect, preserve normal anatomy, preserve normal anatomical and functional relationships and enhance both functionality and cosmetic results   Undermining: area extensively undermined   Undermining comment:  Undermining Defect 1.5 cm Subcutaneous layers (deep stitches):  Suture size:  4-0 Suture type: Vicryl (polyglactin 910)   Subcutaneous suture technique: Inverted Dermal. Fine/surface layer approximation (top stitches):  Suture size:  5-0 Suture type comment:  Nylon Stitches: simple running   Suture removal (days):  7 Hemostasis achieved with: suture and pressure Outcome: patient tolerated procedure well with no complications   Post-procedure details: sterile dressing applied and wound care instructions given   Dressing type: bandage and pressure dressing (Mupirocin)    mupirocin ointment (BACTROBAN) 2 %  Specimen 1 - Surgical pathology Differential Diagnosis: C44.319 BCC Check Margins: No Pink bx site 1.0 x 0.7cm Previous bx [phone]  Return in about 6 days (around 01/31/2020) for sr.    Documentation: I have reviewed the above documentation for accuracy and completeness, and I agree with the above.  Sarina Ser, MD   I, Othelia Pulling, RMA, am acting as scribe for Sarina Ser, MD .

## 2020-01-26 ENCOUNTER — Encounter: Payer: Self-pay | Admitting: Dermatology

## 2020-01-27 ENCOUNTER — Telehealth: Payer: Self-pay

## 2020-01-27 NOTE — Telephone Encounter (Signed)
Lft pt msg to call if any problems after surgery 01/25/20./sh

## 2020-01-31 ENCOUNTER — Ambulatory Visit (INDEPENDENT_AMBULATORY_CARE_PROVIDER_SITE_OTHER): Payer: PPO | Admitting: Dermatology

## 2020-01-31 ENCOUNTER — Other Ambulatory Visit: Payer: Self-pay

## 2020-01-31 DIAGNOSIS — Z4802 Encounter for removal of sutures: Secondary | ICD-10-CM

## 2020-01-31 DIAGNOSIS — Z85828 Personal history of other malignant neoplasm of skin: Secondary | ICD-10-CM

## 2020-01-31 NOTE — Progress Notes (Signed)
   Follow-Up Visit   Subjective  Kelly Bishop is a 86 y.o. female who presents for the following: Suture / Staple Removal (R forehead BCC excised 01/25/2020 ).   The following portions of the chart were reviewed this encounter and updated as appropriate:      Review of Systems:  No other skin or systemic complaints except as noted in HPI or Assessment and Plan.  Objective  Well appearing patient in no apparent distress; mood and affect are within normal limits.  A focused examination was performed including face. Relevant physical exam findings are noted in the Assessment and Plan.  Objective  Right forehead: Well healed scar with no evidence of recurrence.    Assessment & Plan  History of basal cell carcinoma (BCC) Right forehead  Biopsy results discussed   Encounter for Removal of Sutures - Incision site at the R forehead is clean, dry and intact - Wound cleansed, sutures removed, wound cleansed and steri strips applied.  - Discussed pathology results showing BCC - Patient advised to keep steri-strips dry until they fall off. - Scars remodel for a full year. - Once steri-strips fall off, patient can apply over-the-counter silicone scar cream each night to help with scar remodeling if desired. - Patient advised to call with any concerns or if they notice any new or changing lesions.  Return in about 4 months (around 06/02/2020). IMarye Round, CMA, am acting as scribe for Sarina Ser, MD .  Documentation: I have reviewed the above documentation for accuracy and completeness, and I agree with the above.  Sarina Ser, MD

## 2020-02-01 ENCOUNTER — Encounter: Payer: Self-pay | Admitting: Dermatology

## 2020-02-16 DIAGNOSIS — I480 Paroxysmal atrial fibrillation: Secondary | ICD-10-CM | POA: Diagnosis not present

## 2020-02-23 ENCOUNTER — Other Ambulatory Visit: Payer: Self-pay | Admitting: Internal Medicine

## 2020-02-23 DIAGNOSIS — Z1231 Encounter for screening mammogram for malignant neoplasm of breast: Secondary | ICD-10-CM

## 2020-03-10 DIAGNOSIS — H6123 Impacted cerumen, bilateral: Secondary | ICD-10-CM | POA: Diagnosis not present

## 2020-03-10 DIAGNOSIS — H6981 Other specified disorders of Eustachian tube, right ear: Secondary | ICD-10-CM | POA: Diagnosis not present

## 2020-03-10 DIAGNOSIS — J301 Allergic rhinitis due to pollen: Secondary | ICD-10-CM | POA: Diagnosis not present

## 2020-03-10 DIAGNOSIS — H903 Sensorineural hearing loss, bilateral: Secondary | ICD-10-CM | POA: Diagnosis not present

## 2020-03-15 DIAGNOSIS — Q66221 Congenital metatarsus adductus, right foot: Secondary | ICD-10-CM | POA: Diagnosis not present

## 2020-03-15 DIAGNOSIS — M2031 Hallux varus (acquired), right foot: Secondary | ICD-10-CM | POA: Diagnosis not present

## 2020-03-15 DIAGNOSIS — M2042 Other hammer toe(s) (acquired), left foot: Secondary | ICD-10-CM | POA: Diagnosis not present

## 2020-03-15 DIAGNOSIS — M79671 Pain in right foot: Secondary | ICD-10-CM | POA: Diagnosis not present

## 2020-03-15 DIAGNOSIS — I739 Peripheral vascular disease, unspecified: Secondary | ICD-10-CM | POA: Diagnosis not present

## 2020-03-15 DIAGNOSIS — M2041 Other hammer toe(s) (acquired), right foot: Secondary | ICD-10-CM | POA: Diagnosis not present

## 2020-03-15 DIAGNOSIS — B351 Tinea unguium: Secondary | ICD-10-CM | POA: Diagnosis not present

## 2020-03-24 DIAGNOSIS — I480 Paroxysmal atrial fibrillation: Secondary | ICD-10-CM | POA: Diagnosis not present

## 2020-03-24 DIAGNOSIS — E782 Mixed hyperlipidemia: Secondary | ICD-10-CM | POA: Diagnosis not present

## 2020-03-24 DIAGNOSIS — E538 Deficiency of other specified B group vitamins: Secondary | ICD-10-CM | POA: Diagnosis not present

## 2020-03-30 ENCOUNTER — Ambulatory Visit
Admission: RE | Admit: 2020-03-30 | Discharge: 2020-03-30 | Disposition: A | Discharge status: Home / Self Care | Payer: PPO | Source: Ambulatory Visit | Attending: Internal Medicine | Admitting: Internal Medicine

## 2020-03-30 DIAGNOSIS — Z961 Presence of intraocular lens: Secondary | ICD-10-CM | POA: Diagnosis not present

## 2020-03-30 DIAGNOSIS — Z1231 Encounter for screening mammogram for malignant neoplasm of breast: Secondary | ICD-10-CM | POA: Diagnosis not present

## 2020-03-31 DIAGNOSIS — D329 Benign neoplasm of meninges, unspecified: Secondary | ICD-10-CM | POA: Diagnosis not present

## 2020-03-31 DIAGNOSIS — F3341 Major depressive disorder, recurrent, in partial remission: Secondary | ICD-10-CM | POA: Diagnosis not present

## 2020-03-31 DIAGNOSIS — I4821 Permanent atrial fibrillation: Secondary | ICD-10-CM | POA: Diagnosis not present

## 2020-03-31 DIAGNOSIS — I739 Peripheral vascular disease, unspecified: Secondary | ICD-10-CM | POA: Diagnosis not present

## 2020-03-31 DIAGNOSIS — E538 Deficiency of other specified B group vitamins: Secondary | ICD-10-CM | POA: Diagnosis not present

## 2020-03-31 DIAGNOSIS — Z0000 Encounter for general adult medical examination without abnormal findings: Secondary | ICD-10-CM | POA: Diagnosis not present

## 2020-04-26 DIAGNOSIS — I4821 Permanent atrial fibrillation: Secondary | ICD-10-CM | POA: Diagnosis not present

## 2020-06-01 ENCOUNTER — Encounter: Payer: Self-pay | Admitting: Dermatology

## 2020-06-01 ENCOUNTER — Ambulatory Visit: Payer: PPO | Admitting: Dermatology

## 2020-06-01 ENCOUNTER — Other Ambulatory Visit: Payer: Self-pay

## 2020-06-01 DIAGNOSIS — L821 Other seborrheic keratosis: Secondary | ICD-10-CM | POA: Diagnosis not present

## 2020-06-01 DIAGNOSIS — L578 Other skin changes due to chronic exposure to nonionizing radiation: Secondary | ICD-10-CM | POA: Diagnosis not present

## 2020-06-01 DIAGNOSIS — L570 Actinic keratosis: Secondary | ICD-10-CM

## 2020-06-01 DIAGNOSIS — Z85828 Personal history of other malignant neoplasm of skin: Secondary | ICD-10-CM

## 2020-06-01 DIAGNOSIS — B079 Viral wart, unspecified: Secondary | ICD-10-CM

## 2020-06-01 DIAGNOSIS — L820 Inflamed seborrheic keratosis: Secondary | ICD-10-CM

## 2020-06-01 NOTE — Progress Notes (Signed)
   Follow-Up Visit   Subjective  Kelly Bishop is a 86 y.o. female who presents for the following: recheck BCC site (of the right forehead) and lesions (of the right thumb, L preauricular, and abdomen - scaly, irritated, patient is concerned and would like them checked).  The following portions of the chart were reviewed this encounter and updated as appropriate:  Tobacco  Allergies  Meds  Problems  Med Hx  Surg Hx  Fam Hx     Review of Systems:  No other skin or systemic complaints except as noted in HPI or Assessment and Plan.  Objective  Well appearing patient in no apparent distress; mood and affect are within normal limits.  A focused examination was performed including the face, hands, and abdomen. Relevant physical exam findings are noted in the Assessment and Plan.  Objective  R thumb x 1: Verrucous papules -- Discussed viral etiology and contagion.   Objective  L periumbilical abdomen: Erythematous keratotic or waxy stuck-on papule or plaque.   Assessment & Plan  Viral warts, unspecified type R thumb x 1  Destruction of lesion - R thumb x 1 Complexity: simple   Destruction method: cryotherapy   Informed consent: discussed and consent obtained   Timeout:  patient name, date of birth, surgical site, and procedure verified Lesion destroyed using liquid nitrogen: Yes   Region frozen until ice ball extended beyond lesion: Yes   Outcome: patient tolerated procedure well with no complications   Post-procedure details: wound care instructions given    AK (actinic keratosis) L preauricular x 1  Destruction of lesion - L preauricular x 1 Complexity: simple   Destruction method: cryotherapy   Informed consent: discussed and consent obtained   Timeout:  patient name, date of birth, surgical site, and procedure verified Lesion destroyed using liquid nitrogen: Yes   Region frozen until ice ball extended beyond lesion: Yes   Outcome: patient tolerated procedure  well with no complications   Post-procedure details: wound care instructions given    Inflamed seborrheic keratosis L periumbilical abdomen  Destruction of lesion - L periumbilical abdomen Complexity: simple   Destruction method: cryotherapy   Informed consent: discussed and consent obtained   Timeout:  patient name, date of birth, surgical site, and procedure verified Lesion destroyed using liquid nitrogen: Yes   Region frozen until ice ball extended beyond lesion: Yes   Outcome: patient tolerated procedure well with no complications   Post-procedure details: wound care instructions given     Actinic Damage - diffuse scaly erythematous macules with underlying dyspigmentation - Recommend daily broad spectrum sunscreen SPF 30+ to sun-exposed areas, reapply every 2 hours as needed.  - Call for new or changing lesions.  Seborrheic Keratoses - Stuck-on, waxy, tan-brown papules and plaques  - Discussed benign etiology and prognosis. - Observe - Call for any changes  History of Basal Cell Carcinoma of the Skin - R forehead above lat brow, excised 01/25/2020 - No evidence of recurrence today - Recommend regular full body skin exams - Recommend daily broad spectrum sunscreen SPF 30+ to sun-exposed areas, reapply every 2 hours as needed.  - Call if any new or changing lesions are noted between office visits   Return in about 6 months (around 11/29/2020) for TBSE.  Luther Redo, CMA, am acting as scribe for Sarina Ser, MD .  Documentation: I have reviewed the above documentation for accuracy and completeness, and I agree with the above.  Sarina Ser, MD

## 2020-06-02 ENCOUNTER — Encounter: Payer: Self-pay | Admitting: Dermatology

## 2020-06-07 DIAGNOSIS — I480 Paroxysmal atrial fibrillation: Secondary | ICD-10-CM | POA: Diagnosis not present

## 2020-07-26 DIAGNOSIS — I739 Peripheral vascular disease, unspecified: Secondary | ICD-10-CM | POA: Diagnosis not present

## 2020-07-26 DIAGNOSIS — I4821 Permanent atrial fibrillation: Secondary | ICD-10-CM | POA: Diagnosis not present

## 2020-07-26 DIAGNOSIS — I4819 Other persistent atrial fibrillation: Secondary | ICD-10-CM | POA: Diagnosis not present

## 2020-07-26 DIAGNOSIS — E782 Mixed hyperlipidemia: Secondary | ICD-10-CM | POA: Diagnosis not present

## 2020-07-26 DIAGNOSIS — I10 Essential (primary) hypertension: Secondary | ICD-10-CM | POA: Diagnosis not present

## 2020-07-26 DIAGNOSIS — N1831 Chronic kidney disease, stage 3a: Secondary | ICD-10-CM | POA: Diagnosis not present

## 2020-07-26 DIAGNOSIS — I480 Paroxysmal atrial fibrillation: Secondary | ICD-10-CM | POA: Diagnosis not present

## 2020-08-14 DIAGNOSIS — J40 Bronchitis, not specified as acute or chronic: Secondary | ICD-10-CM | POA: Diagnosis not present

## 2020-08-14 DIAGNOSIS — Z20822 Contact with and (suspected) exposure to covid-19: Secondary | ICD-10-CM | POA: Diagnosis not present

## 2020-08-21 DIAGNOSIS — I4821 Permanent atrial fibrillation: Secondary | ICD-10-CM | POA: Diagnosis not present

## 2020-08-25 DIAGNOSIS — I480 Paroxysmal atrial fibrillation: Secondary | ICD-10-CM | POA: Diagnosis not present

## 2020-09-04 DIAGNOSIS — I739 Peripheral vascular disease, unspecified: Secondary | ICD-10-CM | POA: Diagnosis not present

## 2020-09-04 DIAGNOSIS — E782 Mixed hyperlipidemia: Secondary | ICD-10-CM | POA: Diagnosis not present

## 2020-09-04 DIAGNOSIS — I4821 Permanent atrial fibrillation: Secondary | ICD-10-CM | POA: Diagnosis not present

## 2020-09-04 DIAGNOSIS — I10 Essential (primary) hypertension: Secondary | ICD-10-CM | POA: Diagnosis not present

## 2020-09-04 DIAGNOSIS — N1831 Chronic kidney disease, stage 3a: Secondary | ICD-10-CM | POA: Diagnosis not present

## 2020-09-27 DIAGNOSIS — E538 Deficiency of other specified B group vitamins: Secondary | ICD-10-CM | POA: Diagnosis not present

## 2020-09-27 DIAGNOSIS — E782 Mixed hyperlipidemia: Secondary | ICD-10-CM | POA: Diagnosis not present

## 2020-09-27 DIAGNOSIS — E039 Hypothyroidism, unspecified: Secondary | ICD-10-CM | POA: Diagnosis not present

## 2020-09-27 DIAGNOSIS — I739 Peripheral vascular disease, unspecified: Secondary | ICD-10-CM | POA: Diagnosis not present

## 2020-09-27 DIAGNOSIS — I4821 Permanent atrial fibrillation: Secondary | ICD-10-CM | POA: Diagnosis not present

## 2020-09-30 DIAGNOSIS — G4733 Obstructive sleep apnea (adult) (pediatric): Secondary | ICD-10-CM | POA: Diagnosis not present

## 2020-10-02 DIAGNOSIS — F3341 Major depressive disorder, recurrent, in partial remission: Secondary | ICD-10-CM | POA: Diagnosis not present

## 2020-10-02 DIAGNOSIS — I739 Peripheral vascular disease, unspecified: Secondary | ICD-10-CM | POA: Diagnosis not present

## 2020-10-02 DIAGNOSIS — I482 Chronic atrial fibrillation, unspecified: Secondary | ICD-10-CM | POA: Diagnosis not present

## 2020-10-02 DIAGNOSIS — D329 Benign neoplasm of meninges, unspecified: Secondary | ICD-10-CM | POA: Diagnosis not present

## 2020-10-02 DIAGNOSIS — N1832 Chronic kidney disease, stage 3b: Secondary | ICD-10-CM | POA: Diagnosis not present

## 2020-10-02 DIAGNOSIS — E538 Deficiency of other specified B group vitamins: Secondary | ICD-10-CM | POA: Diagnosis not present

## 2020-11-02 DIAGNOSIS — I482 Chronic atrial fibrillation, unspecified: Secondary | ICD-10-CM | POA: Diagnosis not present

## 2020-11-23 ENCOUNTER — Other Ambulatory Visit: Payer: Self-pay

## 2020-11-23 ENCOUNTER — Ambulatory Visit: Payer: PPO | Admitting: Dermatology

## 2020-11-23 DIAGNOSIS — L209 Atopic dermatitis, unspecified: Secondary | ICD-10-CM

## 2020-11-23 DIAGNOSIS — L570 Actinic keratosis: Secondary | ICD-10-CM | POA: Diagnosis not present

## 2020-11-23 DIAGNOSIS — L578 Other skin changes due to chronic exposure to nonionizing radiation: Secondary | ICD-10-CM | POA: Diagnosis not present

## 2020-11-23 DIAGNOSIS — L820 Inflamed seborrheic keratosis: Secondary | ICD-10-CM

## 2020-11-23 DIAGNOSIS — D485 Neoplasm of uncertain behavior of skin: Secondary | ICD-10-CM | POA: Diagnosis not present

## 2020-11-23 MED ORDER — MOMETASONE FUROATE 0.1 % EX CREA: TOPICAL_CREAM | CUTANEOUS | 1 refills | Status: DC

## 2020-11-23 NOTE — Progress Notes (Signed)
Follow-Up Visit   Subjective  Kelly Bishop is a 87 y.o. female who presents for the following: Spot Check (Pt has a few spots she would like checked today on her face, back, chest, and shoulders. ). She also has rash on arm.  The following portions of the chart were reviewed this encounter and updated as appropriate:  Tobacco  Allergies  Meds  Problems  Med Hx  Surg Hx  Fam Hx     Review of Systems: No other skin or systemic complaints except as noted in HPI or Assessment and Plan.  Objective  Well appearing patient in no apparent distress; mood and affect are within normal limits.  A focused examination was performed including face, chest, arms, back. Relevant physical exam findings are noted in the Assessment and Plan.  Objective  right neck x 1, left temple x 1, left cheek x 1, chest x 1, back x 1 (5): Erythematous keratotic or waxy stuck-on papule or plaque.   Objective  Left Upper Arm - Anterior: Scaly erythematous papules and patches +/- dyspigmentation, lichenification, excoriations.   Objective  Upper back spibnal: 0.6 cm Cutaneous horn   R/o SCC Shave removal EDC  Objective  Chest x 9, left shoulder x 1 (10): Erythematous thin papules/macules with gritty scale.   Assessment & Plan  Inflamed seborrheic keratosis (5) right neck x 1, left temple x 1, left cheek x 1, chest x 1, back x 1 Prior to procedure, discussed risks of blister formation, small wound, skin dyspigmentation, or rare scar following cryotherapy.   Destruction of lesion - right neck x 1, left temple x 1, left cheek x 1, chest x 1, back x 1 Complexity: simple   Destruction method: cryotherapy   Informed consent: discussed and consent obtained   Timeout:  patient name, date of birth, surgical site, and procedure verified Lesion destroyed using liquid nitrogen: Yes   Region frozen until ice ball extended beyond lesion: Yes   Outcome: patient tolerated procedure well with no complications    Post-procedure details: wound care instructions given    Atopic dermatitis, unspecified type Left Upper Arm - Anterior Atopic dermatitis (eczema) is a chronic, relapsing, pruritic condition that can significantly affect quality of life. It is often associated with allergic rhinitis and/or asthma and can require treatment with topical medications, phototherapy, or in severe cases a biologic medication called Dupixent in older children and adults.   Start Mometasone cream. Apply BID to affected areas x 2 weeks. #60 g 1 RF mometasone (ELOCON) 0.1 % cream - Left Upper Arm - Anterior  Neoplasm of uncertain behavior of skin Upper back spibnal Epidermal / dermal shaving  Lesion diameter (cm):  0.6 Informed consent: discussed and consent obtained   Timeout: patient name, date of birth, surgical site, and procedure verified   Procedure prep:  Patient was prepped and draped in usual sterile fashion Prep type:  Isopropyl alcohol Anesthesia: the lesion was anesthetized in a standard fashion   Anesthetic:  1% lidocaine w/ epinephrine 1-100,000 buffered w/ 8.4% NaHCO3 Instrument used: flexible razor blade   Hemostasis achieved with: pressure, aluminum chloride and electrodesiccation   Outcome: patient tolerated procedure well   Post-procedure details: sterile dressing applied and wound care instructions given   Dressing type: bandage and petrolatum    Destruction of lesion Complexity: extensive   Destruction method: electrodesiccation and curettage   Informed consent: discussed and consent obtained   Timeout:  patient name, date of birth, surgical site, and procedure  verified Procedure prep:  Patient was prepped and draped in usual sterile fashion Prep type:  Isopropyl alcohol Anesthesia: the lesion was anesthetized in a standard fashion   Anesthetic:  1% lidocaine w/ epinephrine 1-100,000 buffered w/ 8.4% NaHCO3 Curettage performed in three different directions: Yes   Electrodesiccation  performed over the curetted area: Yes   Lesion length (cm):  0.6 Lesion width (cm):  0.6 Margin per side (cm):  0.2 Final wound size (cm):  1 Hemostasis achieved with:  pressure and aluminum chloride Outcome: patient tolerated procedure well with no complications   Post-procedure details: sterile dressing applied and wound care instructions given   Dressing type: bandage and petrolatum    Specimen 1 - Surgical pathology Differential Diagnosis: r/o SCC  Check Margins: Yes 0.6 cm Cutaneous horn   AK (actinic keratosis) x 10 Chest x 9, left shoulder x 1 Prior to procedure, discussed risks of blister formation, small wound, skin dyspigmentation, or rare scar following cryotherapy.   Destruction of lesion - Chest x 9, left shoulder x 1 Complexity: simple   Destruction method: cryotherapy   Informed consent: discussed and consent obtained   Timeout:  patient name, date of birth, surgical site, and procedure verified Lesion destroyed using liquid nitrogen: Yes   Region frozen until ice ball extended beyond lesion: Yes   Outcome: patient tolerated procedure well with no complications   Post-procedure details: wound care instructions given    Actinic Damage - chronic, secondary to cumulative UV radiation exposure/sun exposure over time - diffuse scaly erythematous macules with underlying dyspigmentation - Recommend daily broad spectrum sunscreen SPF 30+ to sun-exposed areas, reapply every 2 hours as needed.  - Recommend staying in the shade or wearing long sleeves, sun glasses (UVA+UVB protection) and wide brim hats (4-inch brim around the entire circumference of the hat). - Call for new or changing lesions.  Return in about 6 months (around 05/26/2021).   IHarriett Sine, CMA, am acting as scribe for Sarina Ser, MD.  Documentation: I have reviewed the above documentation for accuracy and completeness, and I agree with the above.  Sarina Ser, MD

## 2020-11-23 NOTE — Patient Instructions (Signed)

## 2020-11-27 ENCOUNTER — Encounter: Payer: Self-pay | Admitting: Dermatology

## 2020-11-29 ENCOUNTER — Telehealth: Payer: Self-pay

## 2020-11-29 NOTE — Telephone Encounter (Signed)
-----   Message from Ralene Bathe, MD sent at 11/28/2020  8:05 PM EST ----- Diagnosis Skin , upper back spinal HYPERTROPHIC ACTINIC KERATOSIS  PreCancer Already treated Recheck next visit

## 2020-11-29 NOTE — Telephone Encounter (Signed)
Left message for patient to call office for results/hd 

## 2020-11-30 NOTE — Telephone Encounter (Signed)
Informed pt of results. She had no concerns.  °

## 2020-12-05 DIAGNOSIS — I739 Peripheral vascular disease, unspecified: Secondary | ICD-10-CM | POA: Diagnosis not present

## 2020-12-05 DIAGNOSIS — I482 Chronic atrial fibrillation, unspecified: Secondary | ICD-10-CM | POA: Diagnosis not present

## 2020-12-05 DIAGNOSIS — E782 Mixed hyperlipidemia: Secondary | ICD-10-CM | POA: Diagnosis not present

## 2020-12-05 DIAGNOSIS — I480 Paroxysmal atrial fibrillation: Secondary | ICD-10-CM | POA: Diagnosis not present

## 2020-12-05 DIAGNOSIS — I10 Essential (primary) hypertension: Secondary | ICD-10-CM | POA: Diagnosis not present

## 2021-01-09 DIAGNOSIS — I4821 Permanent atrial fibrillation: Secondary | ICD-10-CM | POA: Diagnosis not present

## 2021-02-20 DIAGNOSIS — I4891 Unspecified atrial fibrillation: Secondary | ICD-10-CM | POA: Diagnosis not present

## 2021-03-06 ENCOUNTER — Other Ambulatory Visit: Payer: Self-pay | Admitting: Internal Medicine

## 2021-03-07 DIAGNOSIS — I739 Peripheral vascular disease, unspecified: Secondary | ICD-10-CM | POA: Diagnosis not present

## 2021-03-07 DIAGNOSIS — I480 Paroxysmal atrial fibrillation: Secondary | ICD-10-CM | POA: Diagnosis not present

## 2021-03-27 DIAGNOSIS — E039 Hypothyroidism, unspecified: Secondary | ICD-10-CM | POA: Diagnosis not present

## 2021-03-27 DIAGNOSIS — E538 Deficiency of other specified B group vitamins: Secondary | ICD-10-CM | POA: Diagnosis not present

## 2021-03-27 DIAGNOSIS — I482 Chronic atrial fibrillation, unspecified: Secondary | ICD-10-CM | POA: Diagnosis not present

## 2021-04-02 DIAGNOSIS — I482 Chronic atrial fibrillation, unspecified: Secondary | ICD-10-CM | POA: Diagnosis not present

## 2021-04-02 DIAGNOSIS — M818 Other osteoporosis without current pathological fracture: Secondary | ICD-10-CM | POA: Diagnosis not present

## 2021-04-02 DIAGNOSIS — I739 Peripheral vascular disease, unspecified: Secondary | ICD-10-CM | POA: Diagnosis not present

## 2021-04-02 DIAGNOSIS — E782 Mixed hyperlipidemia: Secondary | ICD-10-CM | POA: Diagnosis not present

## 2021-04-03 DIAGNOSIS — R053 Chronic cough: Secondary | ICD-10-CM | POA: Diagnosis not present

## 2021-04-03 DIAGNOSIS — Z0000 Encounter for general adult medical examination without abnormal findings: Secondary | ICD-10-CM | POA: Diagnosis not present

## 2021-04-03 DIAGNOSIS — D329 Benign neoplasm of meninges, unspecified: Secondary | ICD-10-CM | POA: Diagnosis not present

## 2021-04-03 DIAGNOSIS — R059 Cough, unspecified: Secondary | ICD-10-CM | POA: Diagnosis not present

## 2021-04-03 DIAGNOSIS — I482 Chronic atrial fibrillation, unspecified: Secondary | ICD-10-CM | POA: Diagnosis not present

## 2021-04-03 DIAGNOSIS — I739 Peripheral vascular disease, unspecified: Secondary | ICD-10-CM | POA: Diagnosis not present

## 2021-04-03 DIAGNOSIS — N1831 Chronic kidney disease, stage 3a: Secondary | ICD-10-CM | POA: Diagnosis not present

## 2021-04-03 DIAGNOSIS — E538 Deficiency of other specified B group vitamins: Secondary | ICD-10-CM | POA: Diagnosis not present

## 2021-04-03 DIAGNOSIS — E782 Mixed hyperlipidemia: Secondary | ICD-10-CM | POA: Diagnosis not present

## 2021-04-09 ENCOUNTER — Other Ambulatory Visit: Payer: Self-pay | Admitting: Internal Medicine

## 2021-04-09 DIAGNOSIS — Z1231 Encounter for screening mammogram for malignant neoplasm of breast: Secondary | ICD-10-CM

## 2021-04-18 ENCOUNTER — Other Ambulatory Visit: Payer: Self-pay

## 2021-04-18 ENCOUNTER — Ambulatory Visit
Admission: RE | Admit: 2021-04-18 | Discharge: 2021-04-18 | Disposition: A | Discharge status: Home / Self Care | Payer: PPO | Source: Ambulatory Visit | Attending: Internal Medicine | Admitting: Internal Medicine

## 2021-04-18 DIAGNOSIS — Z1231 Encounter for screening mammogram for malignant neoplasm of breast: Secondary | ICD-10-CM | POA: Insufficient documentation

## 2021-05-31 ENCOUNTER — Ambulatory Visit: Payer: PPO | Admitting: Dermatology

## 2021-05-31 ENCOUNTER — Other Ambulatory Visit: Payer: Self-pay

## 2021-05-31 DIAGNOSIS — L814 Other melanin hyperpigmentation: Secondary | ICD-10-CM

## 2021-05-31 DIAGNOSIS — L821 Other seborrheic keratosis: Secondary | ICD-10-CM

## 2021-05-31 DIAGNOSIS — Z1283 Encounter for screening for malignant neoplasm of skin: Secondary | ICD-10-CM

## 2021-05-31 DIAGNOSIS — S81811A Laceration without foreign body, right lower leg, initial encounter: Secondary | ICD-10-CM | POA: Diagnosis not present

## 2021-05-31 DIAGNOSIS — D1800 Hemangioma unspecified site: Secondary | ICD-10-CM | POA: Diagnosis not present

## 2021-05-31 DIAGNOSIS — L578 Other skin changes due to chronic exposure to nonionizing radiation: Secondary | ICD-10-CM

## 2021-05-31 DIAGNOSIS — D229 Melanocytic nevi, unspecified: Secondary | ICD-10-CM

## 2021-05-31 DIAGNOSIS — L570 Actinic keratosis: Secondary | ICD-10-CM | POA: Diagnosis not present

## 2021-05-31 DIAGNOSIS — L820 Inflamed seborrheic keratosis: Secondary | ICD-10-CM

## 2021-05-31 NOTE — Patient Instructions (Addendum)
Actinic keratoses are precancerous spots that appear secondary to cumulative UV radiation exposure/sun exposure over time. They are chronic with expected duration over 1 year. A portion of actinic keratoses will progress to squamous cell carcinoma of the skin. It is not possible to reliably predict which spots will progress to skin cancer and so treatment is recommended to prevent development of skin cancer.  Recommend daily broad spectrum sunscreen SPF 30+ to sun-exposed areas, reapply every 2 hours as needed.  Recommend staying in the shade or wearing long sleeves, sun glasses (UVA+UVB protection) and wide brim hats (4-inch brim around the entire circumference of the hat). Call for new or changing lesions.   Cryotherapy Aftercare  Wash gently with soap and water everyday.   Apply Vaseline and Band-Aid daily until healed.   If you have any questions or concerns for your doctor, please call our main line at [phone] and press option 4 to reach your doctor's medical assistant. If no one answers, please leave a voicemail as directed and we will return your call as soon as possible. Messages left after 4 pm will be answered the following business day.   You may also send us a message via MyChart. We typically respond to MyChart messages within 1-2 business days.  For prescription refills, please ask your pharmacy to contact our office. Our fax number is [phone].  If you have an urgent issue when the clinic is closed that cannot wait until the next business day, you can page your doctor at the number below.    Please note that while we do our best to be available for urgent issues outside of office hours, we are not available 24/7.   If you have an urgent issue and are unable to reach us, you may choose to seek medical care at your doctor's office, retail clinic, urgent care center, or emergency room.  If you have a medical emergency, please immediately call 911 or go to the emergency  department.  Pager Numbers  - Dr. Kowalski: [phone]  - Dr. Moye: [phone]  - Dr. Stewart: [phone]  In the event of inclement weather, please call our main line at [phone] for an update on the status of any delays or closures.  Dermatology Medication Tips: Please keep the boxes that topical medications come in in order to help keep track of the instructions about where and how to use these. Pharmacies typically print the medication instructions only on the boxes and not directly on the medication tubes.   If your medication is too expensive, please contact our office at [phone] option 4 or send us a message through MyChart.   We are unable to tell what your co-pay for medications will be in advance as this is different depending on your insurance coverage. However, we may be able to find a substitute medication at lower cost or fill out paperwork to get insurance to cover a needed medication.   If a prior authorization is required to get your medication covered by your insurance company, please allow us 1-2 business days to complete this process.  Drug prices often vary depending on where the prescription is filled and some pharmacies may offer cheaper prices.  The website www.goodrx.com contains coupons for medications through different pharmacies. The prices here do not account for what the cost may be with help from insurance (it may be cheaper with your insurance), but the website can give you the price if you did not use any insurance.  - You   can print the associated coupon and take it with your prescription to the pharmacy.  - You may also stop by our office during regular business hours and pick up a GoodRx coupon card.  - If you need your prescription sent electronically to a different pharmacy, notify our office through [facility] MyChart or by phone at [phone] option 4.  

## 2021-05-31 NOTE — Progress Notes (Signed)
Follow-Up Visit   Subjective  Kelly Bishop is a 87 y.o. female who presents for the following: Follow-up (6 month follow up on aks and isks. Patient also reports a wound at right lower leg she would like checked. ). Patient already has rx of mupirocin at home and would like to know if it is ok to use on wound.  Patient here for upper body skin exam and skin cancer screening.  The following portions of the chart were reviewed this encounter and updated as appropriate:  Tobacco  Allergies  Meds  Problems  Med Hx  Surg Hx  Fam Hx     Objective  Well appearing patient in no apparent distress; mood and affect are within normal limits.  A focused examination was performed including upper extremities, including the arms, hands, fingers, and fingernails. Relevant physical exam findings are noted in the Assessment and Plan.  Right Upper Back x 1, chest x 8 (9) Erythematous thin papules/macules with gritty scale.   Left Temple x 1, neck x 10, chest x 3, left lower eyelid margin x 1 (15) Erythematous keratotic or waxy stuck-on papule or plaque.   Right Lower Leg - Anterior 3 cm ulcer at right lower leg  Assessment & Plan  Actinic keratosis (9) Right Upper Back x 1, chest x 8  Actinic keratoses are precancerous spots that appear secondary to cumulative UV radiation exposure/sun exposure over time. They are chronic with expected duration over 1 year. A portion of actinic keratoses will progress to squamous cell carcinoma of the skin. It is not possible to reliably predict which spots will progress to skin cancer and so treatment is recommended to prevent development of skin cancer.  Recommend daily broad spectrum sunscreen SPF 30+ to sun-exposed areas, reapply every 2 hours as needed.  Recommend staying in the shade or wearing long sleeves, sun glasses (UVA+UVB protection) and wide brim hats (4-inch brim around the entire circumference of the hat). Call for new or changing  lesions.  Destruction of lesion - Right Upper Back x 1, chest x 8 Complexity: simple   Destruction method: cryotherapy   Informed consent: discussed and consent obtained   Timeout:  patient name, date of birth, surgical site, and procedure verified Lesion destroyed using liquid nitrogen: Yes   Region frozen until ice ball extended beyond lesion: Yes   Outcome: patient tolerated procedure well with no complications   Post-procedure details: wound care instructions given    Inflamed seborrheic keratosis Left Temple x 1, neck x 10, chest x 3, left lower eyelid margin x 1  Destruction of lesion - Left Temple x 1, neck x 10, chest x 3, left lower eyelid margin x 1 Complexity: simple   Destruction method: cryotherapy   Informed consent: discussed and consent obtained   Timeout:  patient name, date of birth, surgical site, and procedure verified Lesion destroyed using liquid nitrogen: Yes   Region frozen until ice ball extended beyond lesion: Yes   Outcome: patient tolerated procedure well with no complications   Post-procedure details: wound care instructions given    Laceration of right lower leg, initial encounter Right Lower Leg - Anterior Traumatic injury  Advised to apply mupirocin daily and cover with bandage until healed Cleansed wound with Puracyn, applied non stick bandage with mupirocin to wound and wrapped with coban.   Lentigines - Scattered tan macules - Due to sun exposure - Benign-appering, observe - Recommend daily broad spectrum sunscreen SPF 30+ to sun-exposed areas, reapply  every 2 hours as needed. - Call for any changes  Seborrheic Keratoses - Stuck-on, waxy, tan-brown papules and/or plaques  - Benign-appearing - Discussed benign etiology and prognosis. - Observe - Call for any changes  Melanocytic Nevi - Tan-brown and/or pink-flesh-colored symmetric macules and papules - Benign appearing on exam today - Observation - Call clinic for new or changing  moles - Recommend daily use of broad spectrum spf 30+ sunscreen to sun-exposed areas.   Hemangiomas - Red papules - Discussed benign nature - Observe - Call for any changes  Actinic Damage - Chronic condition, secondary to cumulative UV/sun exposure - diffuse scaly erythematous macules with underlying dyspigmentation - Recommend daily broad spectrum sunscreen SPF 30+ to sun-exposed areas, reapply every 2 hours as needed.  - Staying in the shade or wearing long sleeves, sun glasses (UVA+UVB protection) and wide brim hats (4-inch brim around the entire circumference of the hat) are also recommended for sun protection.  - Call for new or changing lesions.  Skin cancer screening performed today.  Return in about 6 months (around 11/28/2021) for follow up on ak and isk tbse . IRuthell Rummage, CMA, am acting as scribe for Sarina Ser, MD. Documentation: I have reviewed the above documentation for accuracy and completeness, and I agree with the above.  Sarina Ser, MD

## 2021-06-04 DIAGNOSIS — I482 Chronic atrial fibrillation, unspecified: Secondary | ICD-10-CM | POA: Diagnosis not present

## 2021-06-05 ENCOUNTER — Encounter: Payer: Self-pay | Admitting: Dermatology

## 2021-07-04 DIAGNOSIS — I4891 Unspecified atrial fibrillation: Secondary | ICD-10-CM | POA: Diagnosis not present

## 2021-07-18 DIAGNOSIS — I10 Essential (primary) hypertension: Secondary | ICD-10-CM | POA: Diagnosis not present

## 2021-07-18 DIAGNOSIS — E782 Mixed hyperlipidemia: Secondary | ICD-10-CM | POA: Diagnosis not present

## 2021-07-18 DIAGNOSIS — J383 Other diseases of vocal cords: Secondary | ICD-10-CM | POA: Diagnosis not present

## 2021-07-18 DIAGNOSIS — I482 Chronic atrial fibrillation, unspecified: Secondary | ICD-10-CM | POA: Diagnosis not present

## 2021-07-18 DIAGNOSIS — I739 Peripheral vascular disease, unspecified: Secondary | ICD-10-CM | POA: Diagnosis not present

## 2021-08-01 DIAGNOSIS — H6123 Impacted cerumen, bilateral: Secondary | ICD-10-CM | POA: Diagnosis not present

## 2021-08-01 DIAGNOSIS — H903 Sensorineural hearing loss, bilateral: Secondary | ICD-10-CM | POA: Diagnosis not present

## 2021-08-01 DIAGNOSIS — I4891 Unspecified atrial fibrillation: Secondary | ICD-10-CM | POA: Diagnosis not present

## 2021-08-30 DIAGNOSIS — I4891 Unspecified atrial fibrillation: Secondary | ICD-10-CM | POA: Diagnosis not present

## 2021-09-19 DIAGNOSIS — H5712 Ocular pain, left eye: Secondary | ICD-10-CM | POA: Diagnosis not present

## 2021-10-04 DIAGNOSIS — D329 Benign neoplasm of meninges, unspecified: Secondary | ICD-10-CM | POA: Diagnosis not present

## 2021-10-04 DIAGNOSIS — F3341 Major depressive disorder, recurrent, in partial remission: Secondary | ICD-10-CM | POA: Diagnosis not present

## 2021-10-04 DIAGNOSIS — I482 Chronic atrial fibrillation, unspecified: Secondary | ICD-10-CM | POA: Diagnosis not present

## 2021-10-04 DIAGNOSIS — E538 Deficiency of other specified B group vitamins: Secondary | ICD-10-CM | POA: Diagnosis not present

## 2021-10-04 DIAGNOSIS — I739 Peripheral vascular disease, unspecified: Secondary | ICD-10-CM | POA: Diagnosis not present

## 2021-10-04 DIAGNOSIS — E782 Mixed hyperlipidemia: Secondary | ICD-10-CM | POA: Diagnosis not present

## 2021-10-04 DIAGNOSIS — N1832 Chronic kidney disease, stage 3b: Secondary | ICD-10-CM | POA: Diagnosis not present

## 2021-11-06 DIAGNOSIS — I4891 Unspecified atrial fibrillation: Secondary | ICD-10-CM | POA: Diagnosis not present

## 2021-11-06 DIAGNOSIS — I482 Chronic atrial fibrillation, unspecified: Secondary | ICD-10-CM | POA: Diagnosis not present

## 2021-11-29 ENCOUNTER — Ambulatory Visit: Payer: PPO | Admitting: Dermatology

## 2021-12-10 DIAGNOSIS — I4891 Unspecified atrial fibrillation: Secondary | ICD-10-CM | POA: Diagnosis not present

## 2021-12-24 DIAGNOSIS — I4891 Unspecified atrial fibrillation: Secondary | ICD-10-CM | POA: Diagnosis not present

## 2022-01-10 DIAGNOSIS — I4891 Unspecified atrial fibrillation: Secondary | ICD-10-CM | POA: Diagnosis not present

## 2022-01-24 DIAGNOSIS — I480 Paroxysmal atrial fibrillation: Secondary | ICD-10-CM | POA: Diagnosis not present

## 2022-02-07 DIAGNOSIS — I482 Chronic atrial fibrillation, unspecified: Secondary | ICD-10-CM | POA: Diagnosis not present

## 2022-02-07 DIAGNOSIS — I4891 Unspecified atrial fibrillation: Secondary | ICD-10-CM | POA: Diagnosis not present

## 2022-02-28 DIAGNOSIS — I4891 Unspecified atrial fibrillation: Secondary | ICD-10-CM | POA: Diagnosis not present

## 2022-02-28 DIAGNOSIS — I482 Chronic atrial fibrillation, unspecified: Secondary | ICD-10-CM | POA: Diagnosis not present

## 2022-04-03 DIAGNOSIS — I482 Chronic atrial fibrillation, unspecified: Secondary | ICD-10-CM | POA: Diagnosis not present

## 2022-04-03 DIAGNOSIS — E538 Deficiency of other specified B group vitamins: Secondary | ICD-10-CM | POA: Diagnosis not present

## 2022-04-03 DIAGNOSIS — E782 Mixed hyperlipidemia: Secondary | ICD-10-CM | POA: Diagnosis not present

## 2022-04-05 ENCOUNTER — Other Ambulatory Visit: Payer: Self-pay | Admitting: Internal Medicine

## 2022-04-05 DIAGNOSIS — Z1231 Encounter for screening mammogram for malignant neoplasm of breast: Secondary | ICD-10-CM

## 2022-04-10 DIAGNOSIS — Z0000 Encounter for general adult medical examination without abnormal findings: Secondary | ICD-10-CM | POA: Diagnosis not present

## 2022-04-10 DIAGNOSIS — I739 Peripheral vascular disease, unspecified: Secondary | ICD-10-CM | POA: Diagnosis not present

## 2022-04-10 DIAGNOSIS — E782 Mixed hyperlipidemia: Secondary | ICD-10-CM | POA: Diagnosis not present

## 2022-04-10 DIAGNOSIS — I482 Chronic atrial fibrillation, unspecified: Secondary | ICD-10-CM | POA: Diagnosis not present

## 2022-04-10 DIAGNOSIS — E538 Deficiency of other specified B group vitamins: Secondary | ICD-10-CM | POA: Diagnosis not present

## 2022-04-26 ENCOUNTER — Ambulatory Visit
Admission: RE | Admit: 2022-04-26 | Discharge: 2022-04-26 | Disposition: A | Discharge status: Home / Self Care | Payer: PPO | Source: Ambulatory Visit | Attending: Internal Medicine | Admitting: Internal Medicine

## 2022-04-26 DIAGNOSIS — Z1231 Encounter for screening mammogram for malignant neoplasm of breast: Secondary | ICD-10-CM | POA: Insufficient documentation

## 2022-05-08 DIAGNOSIS — I482 Chronic atrial fibrillation, unspecified: Secondary | ICD-10-CM | POA: Diagnosis not present

## 2022-05-08 DIAGNOSIS — R1031 Right lower quadrant pain: Secondary | ICD-10-CM | POA: Diagnosis not present

## 2022-05-08 DIAGNOSIS — I4891 Unspecified atrial fibrillation: Secondary | ICD-10-CM | POA: Diagnosis not present

## 2022-05-08 DIAGNOSIS — K625 Hemorrhage of anus and rectum: Secondary | ICD-10-CM | POA: Diagnosis not present

## 2022-05-09 ENCOUNTER — Other Ambulatory Visit: Payer: Self-pay | Admitting: Internal Medicine

## 2022-05-09 ENCOUNTER — Ambulatory Visit
Admission: RE | Admit: 2022-05-09 | Discharge: 2022-05-09 | Disposition: A | Discharge status: Home / Self Care | Payer: PPO | Source: Ambulatory Visit | Attending: Internal Medicine | Admitting: Internal Medicine

## 2022-05-09 DIAGNOSIS — R1031 Right lower quadrant pain: Secondary | ICD-10-CM | POA: Diagnosis not present

## 2022-05-09 DIAGNOSIS — K625 Hemorrhage of anus and rectum: Secondary | ICD-10-CM | POA: Diagnosis not present

## 2022-05-09 DIAGNOSIS — N202 Calculus of kidney with calculus of ureter: Secondary | ICD-10-CM | POA: Diagnosis not present

## 2022-05-09 DIAGNOSIS — K8020 Calculus of gallbladder without cholecystitis without obstruction: Secondary | ICD-10-CM | POA: Diagnosis not present

## 2022-05-09 MED ORDER — IOHEXOL 300 MG/ML  SOLN
100.0000 mL | Freq: Once | INTRAMUSCULAR | Status: AC | PRN
  Administered 2022-05-09: 100 mL via INTRAVENOUS

## 2022-05-12 ENCOUNTER — Other Ambulatory Visit: Payer: Self-pay

## 2022-05-12 DIAGNOSIS — Z923 Personal history of irradiation: Secondary | ICD-10-CM | POA: Diagnosis not present

## 2022-05-12 DIAGNOSIS — E039 Hypothyroidism, unspecified: Secondary | ICD-10-CM | POA: Diagnosis present

## 2022-05-12 DIAGNOSIS — R1084 Generalized abdominal pain: Secondary | ICD-10-CM | POA: Diagnosis not present

## 2022-05-12 DIAGNOSIS — Z885 Allergy status to narcotic agent status: Secondary | ICD-10-CM

## 2022-05-12 DIAGNOSIS — K862 Cyst of pancreas: Secondary | ICD-10-CM | POA: Diagnosis present

## 2022-05-12 DIAGNOSIS — I4891 Unspecified atrial fibrillation: Secondary | ICD-10-CM | POA: Diagnosis not present

## 2022-05-12 DIAGNOSIS — Z808 Family history of malignant neoplasm of other organs or systems: Secondary | ICD-10-CM | POA: Diagnosis not present

## 2022-05-12 DIAGNOSIS — J449 Chronic obstructive pulmonary disease, unspecified: Secondary | ICD-10-CM | POA: Diagnosis present

## 2022-05-12 DIAGNOSIS — Z8042 Family history of malignant neoplasm of prostate: Secondary | ICD-10-CM

## 2022-05-12 DIAGNOSIS — K8012 Calculus of gallbladder with acute and chronic cholecystitis without obstruction: Secondary | ICD-10-CM | POA: Diagnosis not present

## 2022-05-12 DIAGNOSIS — I739 Peripheral vascular disease, unspecified: Secondary | ICD-10-CM | POA: Diagnosis present

## 2022-05-12 DIAGNOSIS — R1011 Right upper quadrant pain: Secondary | ICD-10-CM | POA: Diagnosis present

## 2022-05-12 DIAGNOSIS — K8070 Calculus of gallbladder and bile duct without cholecystitis without obstruction: Secondary | ICD-10-CM | POA: Diagnosis not present

## 2022-05-12 DIAGNOSIS — I480 Paroxysmal atrial fibrillation: Secondary | ICD-10-CM | POA: Diagnosis present

## 2022-05-12 DIAGNOSIS — Z853 Personal history of malignant neoplasm of breast: Secondary | ICD-10-CM

## 2022-05-12 DIAGNOSIS — Z66 Do not resuscitate: Secondary | ICD-10-CM | POA: Diagnosis present

## 2022-05-12 DIAGNOSIS — K5900 Constipation, unspecified: Secondary | ICD-10-CM | POA: Diagnosis present

## 2022-05-12 DIAGNOSIS — N1831 Chronic kidney disease, stage 3a: Secondary | ICD-10-CM | POA: Diagnosis present

## 2022-05-12 DIAGNOSIS — M810 Age-related osteoporosis without current pathological fracture: Secondary | ICD-10-CM | POA: Diagnosis present

## 2022-05-12 DIAGNOSIS — E782 Mixed hyperlipidemia: Secondary | ICD-10-CM | POA: Diagnosis present

## 2022-05-12 DIAGNOSIS — Z806 Family history of leukemia: Secondary | ICD-10-CM | POA: Diagnosis not present

## 2022-05-12 DIAGNOSIS — Z85828 Personal history of other malignant neoplasm of skin: Secondary | ICD-10-CM

## 2022-05-12 DIAGNOSIS — N390 Urinary tract infection, site not specified: Secondary | ICD-10-CM | POA: Diagnosis not present

## 2022-05-12 DIAGNOSIS — Q453 Other congenital malformations of pancreas and pancreatic duct: Secondary | ICD-10-CM

## 2022-05-12 DIAGNOSIS — Z803 Family history of malignant neoplasm of breast: Secondary | ICD-10-CM | POA: Diagnosis not present

## 2022-05-12 DIAGNOSIS — M1990 Unspecified osteoarthritis, unspecified site: Secondary | ICD-10-CM | POA: Diagnosis present

## 2022-05-12 DIAGNOSIS — N136 Pyonephrosis: Secondary | ICD-10-CM | POA: Diagnosis present

## 2022-05-12 DIAGNOSIS — Z792 Long term (current) use of antibiotics: Secondary | ICD-10-CM

## 2022-05-12 DIAGNOSIS — K8050 Calculus of bile duct without cholangitis or cholecystitis without obstruction: Secondary | ICD-10-CM | POA: Diagnosis present

## 2022-05-12 DIAGNOSIS — K810 Acute cholecystitis: Secondary | ICD-10-CM | POA: Diagnosis not present

## 2022-05-12 DIAGNOSIS — Z801 Family history of malignant neoplasm of trachea, bronchus and lung: Secondary | ICD-10-CM | POA: Diagnosis not present

## 2022-05-12 DIAGNOSIS — Z79890 Hormone replacement therapy (postmenopausal): Secondary | ICD-10-CM

## 2022-05-12 DIAGNOSIS — I10 Essential (primary) hypertension: Secondary | ICD-10-CM | POA: Diagnosis not present

## 2022-05-12 DIAGNOSIS — I129 Hypertensive chronic kidney disease with stage 1 through stage 4 chronic kidney disease, or unspecified chronic kidney disease: Secondary | ICD-10-CM | POA: Diagnosis present

## 2022-05-12 DIAGNOSIS — N1330 Unspecified hydronephrosis: Secondary | ICD-10-CM | POA: Diagnosis not present

## 2022-05-12 DIAGNOSIS — E876 Hypokalemia: Secondary | ICD-10-CM | POA: Diagnosis not present

## 2022-05-12 DIAGNOSIS — K8020 Calculus of gallbladder without cholecystitis without obstruction: Secondary | ICD-10-CM | POA: Diagnosis not present

## 2022-05-12 DIAGNOSIS — Z96653 Presence of artificial knee joint, bilateral: Secondary | ICD-10-CM | POA: Diagnosis present

## 2022-05-12 DIAGNOSIS — Z7982 Long term (current) use of aspirin: Secondary | ICD-10-CM

## 2022-05-12 DIAGNOSIS — Z79811 Long term (current) use of aromatase inhibitors: Secondary | ICD-10-CM

## 2022-05-12 DIAGNOSIS — N132 Hydronephrosis with renal and ureteral calculous obstruction: Secondary | ICD-10-CM | POA: Diagnosis not present

## 2022-05-12 DIAGNOSIS — Z7901 Long term (current) use of anticoagulants: Secondary | ICD-10-CM

## 2022-05-12 DIAGNOSIS — Z981 Arthrodesis status: Secondary | ICD-10-CM

## 2022-05-12 DIAGNOSIS — Z91010 Allergy to peanuts: Secondary | ICD-10-CM

## 2022-05-12 DIAGNOSIS — Z79899 Other long term (current) drug therapy: Secondary | ICD-10-CM

## 2022-05-12 DIAGNOSIS — R935 Abnormal findings on diagnostic imaging of other abdominal regions, including retroperitoneum: Secondary | ICD-10-CM | POA: Diagnosis not present

## 2022-05-12 LAB — CBC
HCT: 40.5 % (ref 36.0–46.0)
Hemoglobin: 13.1 g/dL (ref 12.0–15.0)
MCH: 29.4 pg (ref 26.0–34.0)
MCHC: 32.3 g/dL (ref 30.0–36.0)
MCV: 91 fL (ref 80.0–100.0)
Platelets: 196 10*3/uL (ref 150–400)
RBC: 4.45 MIL/uL (ref 3.87–5.11)
RDW: 13 % (ref 11.5–15.5)
WBC: 3.5 10*3/uL — ABNORMAL LOW (ref 4.0–10.5)
nRBC: 0 % (ref 0.0–0.2)

## 2022-05-12 LAB — COMPREHENSIVE METABOLIC PANEL
ALT: 13 U/L (ref 0–44)
AST: 25 U/L (ref 15–41)
Albumin: 3.8 g/dL (ref 3.5–5.0)
Alkaline Phosphatase: 56 U/L (ref 38–126)
Anion gap: 8 (ref 5–15)
BUN: 18 mg/dL (ref 8–23)
CO2: 24 mmol/L (ref 22–32)
Calcium: 9 mg/dL (ref 8.9–10.3)
Chloride: 109 mmol/L (ref 98–111)
Creatinine, Ser: 1.16 mg/dL — ABNORMAL HIGH (ref 0.44–1.00)
GFR, Estimated: 45 mL/min — ABNORMAL LOW (ref >60)
Glucose, Bld: 90 mg/dL (ref 70–99)
Potassium: 3.3 mmol/L — ABNORMAL LOW (ref 3.5–5.1)
Sodium: 141 mmol/L (ref 135–145)
Total Bilirubin: 0.5 mg/dL (ref 0.3–1.2)
Total Protein: 6.7 g/dL (ref 6.5–8.1)

## 2022-05-12 LAB — TROPONIN I (HIGH SENSITIVITY): Troponin I (High Sensitivity): 8 ng/L (ref <18)

## 2022-05-12 LAB — LIPASE, BLOOD: Lipase: 37 U/L (ref 11–51)

## 2022-05-12 MED ORDER — ONDANSETRON 4 MG PO TBDP
4.0000 mg | ORAL_TABLET | Freq: Once | ORAL | Status: AC | PRN
  Administered 2022-05-12: 4 mg via ORAL
  Filled 2022-05-12: qty 1

## 2022-05-12 NOTE — ED Triage Notes (Signed)
TO triage via wheelchair with c/o abd pain, RLQ and Right Flank pain x 1 week.  CT on Thursday showed both gallstones and renal stones per pt.  Pain has increased today.

## 2022-05-13 ENCOUNTER — Emergency Department: Payer: PPO

## 2022-05-13 ENCOUNTER — Encounter: Payer: Self-pay | Admitting: Radiology

## 2022-05-13 ENCOUNTER — Inpatient Hospital Stay
Admission: EM | Admit: 2022-05-13 | Discharge: 2022-05-17 | DRG: 418 | Disposition: A | Discharge status: Home Health | Payer: PPO | Attending: Internal Medicine | Admitting: Internal Medicine

## 2022-05-13 DIAGNOSIS — E782 Mixed hyperlipidemia: Secondary | ICD-10-CM | POA: Diagnosis present

## 2022-05-13 DIAGNOSIS — Z853 Personal history of malignant neoplasm of breast: Secondary | ICD-10-CM | POA: Diagnosis not present

## 2022-05-13 DIAGNOSIS — M810 Age-related osteoporosis without current pathological fracture: Secondary | ICD-10-CM | POA: Diagnosis present

## 2022-05-13 DIAGNOSIS — K862 Cyst of pancreas: Secondary | ICD-10-CM | POA: Diagnosis present

## 2022-05-13 DIAGNOSIS — N390 Urinary tract infection, site not specified: Secondary | ICD-10-CM

## 2022-05-13 DIAGNOSIS — N10 Acute tubulo-interstitial nephritis: Secondary | ICD-10-CM | POA: Diagnosis present

## 2022-05-13 DIAGNOSIS — E039 Hypothyroidism, unspecified: Secondary | ICD-10-CM | POA: Diagnosis present

## 2022-05-13 DIAGNOSIS — N23 Unspecified renal colic: Secondary | ICD-10-CM

## 2022-05-13 DIAGNOSIS — E876 Hypokalemia: Secondary | ICD-10-CM | POA: Diagnosis present

## 2022-05-13 DIAGNOSIS — Z66 Do not resuscitate: Secondary | ICD-10-CM | POA: Diagnosis present

## 2022-05-13 DIAGNOSIS — J449 Chronic obstructive pulmonary disease, unspecified: Secondary | ICD-10-CM | POA: Diagnosis present

## 2022-05-13 DIAGNOSIS — K8070 Calculus of gallbladder and bile duct without cholecystitis without obstruction: Secondary | ICD-10-CM | POA: Diagnosis present

## 2022-05-13 DIAGNOSIS — I10 Essential (primary) hypertension: Secondary | ICD-10-CM | POA: Diagnosis present

## 2022-05-13 DIAGNOSIS — Z792 Long term (current) use of antibiotics: Secondary | ICD-10-CM | POA: Diagnosis not present

## 2022-05-13 DIAGNOSIS — N1831 Chronic kidney disease, stage 3a: Secondary | ICD-10-CM | POA: Diagnosis present

## 2022-05-13 DIAGNOSIS — K8012 Calculus of gallbladder with acute and chronic cholecystitis without obstruction: Secondary | ICD-10-CM | POA: Diagnosis not present

## 2022-05-13 DIAGNOSIS — I739 Peripheral vascular disease, unspecified: Secondary | ICD-10-CM | POA: Diagnosis present

## 2022-05-13 DIAGNOSIS — Z85828 Personal history of other malignant neoplasm of skin: Secondary | ICD-10-CM | POA: Diagnosis not present

## 2022-05-13 DIAGNOSIS — N132 Hydronephrosis with renal and ureteral calculous obstruction: Secondary | ICD-10-CM | POA: Diagnosis not present

## 2022-05-13 DIAGNOSIS — Z923 Personal history of irradiation: Secondary | ICD-10-CM | POA: Diagnosis not present

## 2022-05-13 DIAGNOSIS — Z806 Family history of leukemia: Secondary | ICD-10-CM | POA: Diagnosis not present

## 2022-05-13 DIAGNOSIS — M1990 Unspecified osteoarthritis, unspecified site: Secondary | ICD-10-CM | POA: Diagnosis present

## 2022-05-13 DIAGNOSIS — I129 Hypertensive chronic kidney disease with stage 1 through stage 4 chronic kidney disease, or unspecified chronic kidney disease: Secondary | ICD-10-CM | POA: Diagnosis present

## 2022-05-13 DIAGNOSIS — Z8042 Family history of malignant neoplasm of prostate: Secondary | ICD-10-CM | POA: Diagnosis not present

## 2022-05-13 DIAGNOSIS — K8020 Calculus of gallbladder without cholecystitis without obstruction: Secondary | ICD-10-CM | POA: Diagnosis not present

## 2022-05-13 DIAGNOSIS — I4891 Unspecified atrial fibrillation: Secondary | ICD-10-CM | POA: Diagnosis present

## 2022-05-13 DIAGNOSIS — I480 Paroxysmal atrial fibrillation: Secondary | ICD-10-CM | POA: Diagnosis present

## 2022-05-13 DIAGNOSIS — Z96653 Presence of artificial knee joint, bilateral: Secondary | ICD-10-CM | POA: Diagnosis present

## 2022-05-13 DIAGNOSIS — K8050 Calculus of bile duct without cholangitis or cholecystitis without obstruction: Secondary | ICD-10-CM

## 2022-05-13 DIAGNOSIS — Q453 Other congenital malformations of pancreas and pancreatic duct: Secondary | ICD-10-CM | POA: Diagnosis not present

## 2022-05-13 DIAGNOSIS — R935 Abnormal findings on diagnostic imaging of other abdominal regions, including retroperitoneum: Secondary | ICD-10-CM | POA: Diagnosis not present

## 2022-05-13 DIAGNOSIS — Z7901 Long term (current) use of anticoagulants: Secondary | ICD-10-CM | POA: Diagnosis not present

## 2022-05-13 DIAGNOSIS — N1330 Unspecified hydronephrosis: Secondary | ICD-10-CM | POA: Diagnosis not present

## 2022-05-13 DIAGNOSIS — Z801 Family history of malignant neoplasm of trachea, bronchus and lung: Secondary | ICD-10-CM | POA: Diagnosis not present

## 2022-05-13 DIAGNOSIS — Z808 Family history of malignant neoplasm of other organs or systems: Secondary | ICD-10-CM | POA: Diagnosis not present

## 2022-05-13 DIAGNOSIS — R1084 Generalized abdominal pain: Principal | ICD-10-CM

## 2022-05-13 DIAGNOSIS — Z803 Family history of malignant neoplasm of breast: Secondary | ICD-10-CM | POA: Diagnosis not present

## 2022-05-13 DIAGNOSIS — N136 Pyonephrosis: Secondary | ICD-10-CM | POA: Diagnosis present

## 2022-05-13 DIAGNOSIS — R1011 Right upper quadrant pain: Secondary | ICD-10-CM | POA: Diagnosis present

## 2022-05-13 HISTORY — DX: Calculus of bile duct without cholangitis or cholecystitis without obstruction: K80.50

## 2022-05-13 LAB — BASIC METABOLIC PANEL
Anion gap: 6 (ref 5–15)
BUN: 12 mg/dL (ref 8–23)
CO2: 26 mmol/L (ref 22–32)
Calcium: 8.3 mg/dL — ABNORMAL LOW (ref 8.9–10.3)
Chloride: 106 mmol/L (ref 98–111)
Creatinine, Ser: 1.06 mg/dL — ABNORMAL HIGH (ref 0.44–1.00)
GFR, Estimated: 51 mL/min — ABNORMAL LOW (ref >60)
Glucose, Bld: 100 mg/dL — ABNORMAL HIGH (ref 70–99)
Potassium: 3.7 mmol/L (ref 3.5–5.1)
Sodium: 138 mmol/L (ref 135–145)

## 2022-05-13 LAB — URINALYSIS, ROUTINE W REFLEX MICROSCOPIC
Bacteria, UA: NONE SEEN
Bilirubin Urine: NEGATIVE
Glucose, UA: NEGATIVE mg/dL
Ketones, ur: NEGATIVE mg/dL
Nitrite: NEGATIVE
Protein, ur: 100 mg/dL — AB
RBC / HPF: >50 RBC/hpf — ABNORMAL HIGH (ref 0–5)
Specific Gravity, Urine: 1.019 (ref 1.005–1.030)
Squamous Epithelial / HPF: NONE SEEN (ref 0–5)
WBC, UA: >50 WBC/hpf — ABNORMAL HIGH (ref 0–5)
pH: 6 (ref 5.0–8.0)

## 2022-05-13 LAB — PROTIME-INR
INR: 1.4 — ABNORMAL HIGH (ref 0.8–1.2)
Prothrombin Time: 17.2 seconds — ABNORMAL HIGH (ref 11.4–15.2)

## 2022-05-13 LAB — TROPONIN I (HIGH SENSITIVITY): Troponin I (High Sensitivity): 9 ng/L (ref <18)

## 2022-05-13 MED ORDER — FENTANYL CITRATE PF 50 MCG/ML IJ SOSY
25.0000 ug | PREFILLED_SYRINGE | Freq: Once | INTRAMUSCULAR | Status: AC
  Administered 2022-05-13: 25 ug via INTRAVENOUS
  Filled 2022-05-13: qty 1

## 2022-05-13 MED ORDER — PIPERACILLIN-TAZOBACTAM 3.375 G IVPB 30 MIN
3.3750 g | Freq: Once | INTRAVENOUS | Status: AC
  Administered 2022-05-13: 3.375 g via INTRAVENOUS
  Filled 2022-05-13: qty 50

## 2022-05-13 MED ORDER — SODIUM CHLORIDE 0.9 % IV SOLN
2.0000 g | Freq: Two times a day (BID) | INTRAVENOUS | Status: DC
  Administered 2022-05-13 – 2022-05-17 (×9): 2 g via INTRAVENOUS
  Filled 2022-05-13 (×10): qty 12.5

## 2022-05-13 MED ORDER — MELATONIN 5 MG PO TABS
5.0000 mg | ORAL_TABLET | Freq: Once | ORAL | Status: DC
Start: 2022-05-13 — End: 2022-05-13

## 2022-05-13 MED ORDER — LORATADINE 10 MG PO TABS
10.0000 mg | ORAL_TABLET | Freq: Every day | ORAL | Status: DC
  Administered 2022-05-14 – 2022-05-15 (×2): 10 mg via ORAL
  Filled 2022-05-13 (×2): qty 1

## 2022-05-13 MED ORDER — ALPRAZOLAM 0.25 MG PO TABS
0.2500 mg | ORAL_TABLET | Freq: Once | ORAL | Status: AC
Start: 2022-05-13 — End: 2022-05-13
  Administered 2022-05-13: 0.25 mg via ORAL
  Filled 2022-05-13: qty 1

## 2022-05-13 MED ORDER — ACETAMINOPHEN 650 MG RE SUPP: 650.0000 mg | Freq: Four times a day (QID) | RECTAL | Status: DC | PRN

## 2022-05-13 MED ORDER — MORPHINE SULFATE (PF) 2 MG/ML IV SOLN
2.0000 mg | INTRAVENOUS | Status: DC | PRN
  Administered 2022-05-14: 2 mg via INTRAVENOUS
  Filled 2022-05-13: qty 1

## 2022-05-13 MED ORDER — SODIUM CHLORIDE 0.9 % IV SOLN: INTRAVENOUS | Status: DC

## 2022-05-13 MED ORDER — ONDANSETRON HCL 4 MG PO TABS: 4.0000 mg | ORAL_TABLET | Freq: Four times a day (QID) | ORAL | Status: DC | PRN

## 2022-05-13 MED ORDER — TAMSULOSIN HCL 0.4 MG PO CAPS
0.4000 mg | ORAL_CAPSULE | Freq: Every day | ORAL | Status: DC
  Administered 2022-05-13 – 2022-05-17 (×4): 0.4 mg via ORAL
  Filled 2022-05-13 (×4): qty 1

## 2022-05-13 MED ORDER — HYDRALAZINE HCL 20 MG/ML IJ SOLN: 10.0000 mg | Freq: Four times a day (QID) | INTRAMUSCULAR | Status: DC | PRN

## 2022-05-13 MED ORDER — HYDRALAZINE HCL 20 MG/ML IJ SOLN
10.0000 mg | INTRAMUSCULAR | Status: DC | PRN
Start: 2022-05-13 — End: 2022-05-17
  Filled 2022-05-13: qty 1

## 2022-05-13 MED ORDER — ACETAMINOPHEN 325 MG PO TABS
650.0000 mg | ORAL_TABLET | Freq: Four times a day (QID) | ORAL | Status: DC | PRN
  Administered 2022-05-13: 650 mg via ORAL
  Filled 2022-05-13: qty 2

## 2022-05-13 MED ORDER — LACTATED RINGERS IV SOLN: INTRAVENOUS | Status: DC

## 2022-05-13 MED ORDER — ONDANSETRON HCL 4 MG/2ML IJ SOLN
4.0000 mg | Freq: Once | INTRAMUSCULAR | Status: AC
Start: 2022-05-13 — End: 2022-05-13
  Administered 2022-05-13: 4 mg via INTRAVENOUS
  Filled 2022-05-13: qty 2

## 2022-05-13 MED ORDER — ONDANSETRON HCL 4 MG/2ML IJ SOLN
4.0000 mg | Freq: Four times a day (QID) | INTRAMUSCULAR | Status: DC | PRN
  Administered 2022-05-14 (×2): 4 mg via INTRAVENOUS
  Filled 2022-05-13 (×4): qty 2

## 2022-05-13 MED ORDER — SODIUM CHLORIDE 0.9 % IV BOLUS
1000.0000 mL | Freq: Once | INTRAVENOUS | Status: AC
Start: 2022-05-13 — End: 2022-05-13
  Administered 2022-05-13: 1000 mL via INTRAVENOUS

## 2022-05-13 MED ORDER — METRONIDAZOLE 500 MG/100ML IV SOLN
500.0000 mg | Freq: Two times a day (BID) | INTRAVENOUS | Status: DC
  Administered 2022-05-13 – 2022-05-17 (×9): 500 mg via INTRAVENOUS
  Filled 2022-05-13 (×10): qty 100

## 2022-05-13 NOTE — Assessment & Plan Note (Addendum)
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

## 2022-05-13 NOTE — Assessment & Plan Note (Signed)
DuoNebs as needed 

## 2022-05-13 NOTE — Consult Note (Signed)
Inpatient Consultation   Patient ID: Kelly Bishop is a 88 y.o. female.  Requesting Provider: Lurline Hare, MD-emergency department  Date of Admission: 05/13/2022  Date of Consult: 05/13/22   Reason for Consultation: Choledocholithiasis  Patient's Chief Complaint:   Chief Complaint  Patient presents with   Abdominal Pain    89 y/o CF c Afib on coumadin, CKD3, hypothyroidism, Zenkers diverticulum s/p repair (2013), Breast Cancer who presents for right sided pain. GI consulted for choledocholithiasis.  Patient underwent CT with her primary care provider on August 17 demonstrating small choledocholithiasis with normal LFTs on labs.  She was referred to GI and was awaiting appointment.  She presents with increasing right-sided abdominal pain and was found to have both nephrolithiasis causing obstruction and choledocholithiasis.  MRCP confirmed presence of choledocholithiasis.  LFTs continue to remain normal.  Upon evaluation the patient's abdominal pain is improved.  She is on cefepime and Flagyl.  Vital signs remained stable without signs of cholangitis.  She denies nausea vomiting diarrhea constipation melena hematochezia acholic stools weight loss and appetite changes.  No dysphagia or odynophagia  On coumadin - has recently had her dose cut down by her primary Denies NSAIDs, Anti-plt agents Denies family history of gastrointestinal disease and malignancy Previous Endoscopies: remote CSY and EGD   Past Medical History:  Diagnosis Date   Actinic keratosis 05/17/2008   L upper lip - bx proven   Actinic keratosis 10/06/2014   R mid pretibial med - bx proven   Actinic keratosis 11/23/2020   upper back spinal, bx proven   Anemia    Atrial fibrillation ([place])    B12 deficiency    Basal cell carcinoma 08/08/2009   R ant deltoid lat    Basal cell carcinoma 08/08/2009   R ant deltoid med   Basal cell carcinoma 07/31/2010   R mid pretibial   Basal cell carcinoma 10/06/2014    L distal ant thigh   Basal cell carcinoma 10/11/2015   L med distal tricep near elbow   Basal cell carcinoma 02/19/2017   R prox mandible - excision 05/27/2017   Basal cell carcinoma 04/23/2017   L distal pretibial    Basal cell carcinoma 01/25/2020   R forehead above lat brow   Breast cancer ([place]) 2015   Right   Breast cancer, right breast ([place])    right breast Invasive mammary carcinoma grade 1   Cataracts, bilateral    COPD (chronic obstructive pulmonary disease) ([place])    Hemorrhoids    Lymphopenia    Osteoarthritis    Osteoporosis July 2015   seen on DEXA scan-T score of -2.9 in the left neck femur   Pancytopenia ([place])    Personal history of radiation therapy    Skin cancer    Spastic dysphonia    Squamous cell carcinoma of skin 06/22/2007   R mid pretibial   Squamous cell carcinoma of skin 05/17/2008   R pretibial    Squamous cell carcinoma of skin 08/08/2009   R dorsum prox wrist   Squamous cell carcinoma of skin 10/06/2014   L pretibial    Squamous cell carcinoma of skin 12/08/2017   L pretibial    Squamous cell carcinoma of skin 07/26/2019   L mid lat pretibial    Thrombocytopenia ([name])    Unsteady gait    Zenker's diverticulum     Past Surgical History:  Procedure Laterality Date   BREAST EXCISIONAL BIOPSY Left    x 20 years surgical  bx   BREAST EXCISIONAL BIOPSY Left 01/24/2016   ultrasound - Papaloma   BREAST LUMPECTOMY Right 2015   BREAST LUMPECTOMY WITH NEEDLE LOCALIZATION Left 03/12/2016   Procedure: BREAST LUMPECTOMY WITH NEEDLE LOCALIZATION;  Surgeon: Leonie Green, MD;  Location: [facility] ORS;  Service: General;  Laterality: Left;   HYSTEROSCOPY WITH D & C N/A 07/05/2016   Procedure: DILATATION AND CURETTAGE [place];  Surgeon: [name], MD;  Location: [facility] ORS;  Service: Gynecology;  Laterality: N/A;   L1-L4 laminectomy and T6 fusion  May 9030   complicated by postoperative infection, more recently had recurrent pseudomonas infection  at scar site March 2015   REPLACEMENT TOTAL KNEE Bilateral    ROTATOR CUFF REPAIR Bilateral    ultrasound guided biopsy of breast  Jan 27, 2014    Allergies  Allergen Reactions   Ace Inhibitors Cough   Beta Adrenergic Blockers Other (See Comments)   Morphine And Related Nausea And Vomiting   Peanut-Containing Drug Products Hives   Codeine Nausea Only and Rash    Family History  Problem Relation Age of Onset   Throat cancer Mother    Prostate cancer Brother    Breast cancer Brother    Leukemia Brother    Lung cancer Father     Social History   Tobacco Use   Smoking status: Never   Smokeless tobacco: Never  Vaping Use   Vaping Use: Never used  Substance Use Topics   Alcohol use: No    Alcohol/week: 0.0 standard drinks of alcohol   Drug use: No     Pertinent GI related history and allergies were reviewed with the patient  Review of Systems  Constitutional:  Negative for activity change, appetite change, chills, diaphoresis, fatigue, fever and unexpected weight change.  HENT:  Negative for trouble swallowing and voice change.   Respiratory:  Negative for shortness of breath and wheezing.   Cardiovascular:  Negative for chest pain, palpitations and leg swelling.  Gastrointestinal:  Positive for abdominal pain. Negative for abdominal distention, anal bleeding, blood in stool, constipation, diarrhea, nausea, rectal pain and vomiting.  Musculoskeletal:  Negative for arthralgias and myalgias.  Skin:  Negative for color change and pallor.  Neurological:  Negative for dizziness, syncope and weakness.  Psychiatric/Behavioral:  Negative for confusion.   All other systems reviewed and are negative.    Medications Home Medications No current facility-administered medications on file prior to encounter.   Current Outpatient Medications on File Prior to Encounter  Medication Sig Dispense Refill   ALPRAZolam (XANAX) 0.5 MG tablet Take 0.25 mg by mouth at bedtime.      amiodarone (PACERONE) 200 MG tablet Take 300 mg by mouth daily.     amoxicillin (AMOXIL) 500 MG capsule 1,000 mg. Prior to dental procedures     anastrozole (ARIMIDEX) 1 MG tablet Take 1 tablet (1 mg total) by mouth daily. (Patient not taking: No sig reported) 30 tablet 11   aspirin 81 MG chewable tablet Chew 81 mg by mouth daily.      bisacodyl (DULCOLAX) 5 MG EC tablet Take 10 mg by mouth at bedtime.      Calcium Carbonate-Vit D-Min (CALCIUM 600+D PLUS MINERALS) 600-400 MG-UNIT CHEW Chew 1 Dose by mouth daily.      Cholecalciferol 2000 units CAPS Take 1 capsule by mouth daily.     ciprofloxacin (CIPRO) 500 MG tablet Take 500 mg by mouth 2 (two) times daily.      Cyanocobalamin 1000 MCG TBCR Take 1,000 mcg  by mouth daily.      denosumab (PROLIA) 60 MG/ML SOLN injection Inject into the skin.     docusate sodium (COLACE) 100 MG capsule Take 1 capsule (100 mg total) by mouth daily as needed for mild constipation. (Patient not taking: No sig reported) 60 capsule 3   fexofenadine (ALLEGRA) 180 MG tablet Take 180 mg by mouth daily.     furosemide (LASIX) 40 MG tablet Take 40 mg by mouth daily as needed.      mometasone (ELOCON) 0.1 % cream Apply BID to affected areas as directed 60 g 1   mupirocin ointment (BACTROBAN) 2 % Place 1 application into the nose daily. Qd t excision site 22 g 0   ondansetron (ZOFRAN ODT) 4 MG disintegrating tablet Take 1 tablet (4 mg total) by mouth every 8 (eight) hours as needed for nausea or vomiting. (Patient not taking: No sig reported) 20 tablet 0   potassium chloride (K-DUR) 10 MEQ tablet Take 10 mEq by mouth daily as needed.     warfarin (COUMADIN) 5 MG tablet Take 2.5 mg by mouth daily at 6 PM. Alternates with '6mg'$  tablet i.e.6, 2.5,6.2.5,6,2.5     warfarin (COUMADIN) 6 MG tablet Take 6 mg by mouth daily at 6 PM. Alternates with '5mg'$  tablet i.e. 6, 2.5, 6, 2.5     Pertinent GI related medications were reviewed with the patient  Inpatient Medications  Current  Facility-Administered Medications:    acetaminophen (TYLENOL) tablet 650 mg, 650 mg, Oral, Q6H PRN **OR** acetaminophen (TYLENOL) suppository 650 mg, 650 mg, Rectal, Q6H PRN, Athena Masse, MD   ceFEPIme (MAXIPIME) 2 g in sodium chloride 0.9 % 100 mL IVPB, 2 g, Intravenous, Q12H, Athena Masse, MD   lactated ringers infusion, , Intravenous, Continuous, Athena Masse, MD, Stopped at 05/13/22 0534   metroNIDAZOLE (FLAGYL) IVPB 500 mg, 500 mg, Intravenous, Q12H, Athena Masse, MD   morphine (PF) 2 MG/ML injection 2 mg, 2 mg, Intravenous, Q2H PRN, Athena Masse, MD   ondansetron (ZOFRAN) tablet 4 mg, 4 mg, Oral, Q6H PRN **OR** ondansetron (ZOFRAN) injection 4 mg, 4 mg, Intravenous, Q6H PRN, Athena Masse, MD   tamsulosin (FLOMAX) capsule 0.4 mg, 0.4 mg, Oral, Daily, Judd Gaudier V, MD  ceFEPime (MAXIPIME) IV     lactated ringers Stopped (05/13/22 0534)   metronidazole      acetaminophen **OR** acetaminophen, morphine injection, ondansetron **OR** ondansetron (ZOFRAN) IV   Objective   Vitals:   05/13/22 0141 05/13/22 0144 05/13/22 0259 05/13/22 0359  BP: (!) 151/65  (!) 163/92 (!) 179/76  Pulse: 62  62 (!) 57  Resp: '17  20 18  '$ Temp:  97.8 F (36.6 C)  98.2 F (36.8 C)  TempSrc:  Oral  Oral  SpO2: 98%  99% 99%  Weight:      Height:         Physical Exam Vitals and nursing note reviewed.  Constitutional:      General: She is not in acute distress.    Appearance: Normal appearance. She is not ill-appearing, toxic-appearing or diaphoretic.  HENT:     Head: Normocephalic and atraumatic.     Nose: Nose normal.     Mouth/Throat:     Mouth: Mucous membranes are moist.     Pharynx: Oropharynx is clear.  Eyes:     General: No scleral icterus.    Extraocular Movements: Extraocular movements intact.  Cardiovascular:     Rate and Rhythm: Normal rate and regular  rhythm.     Heart sounds: Normal heart sounds. No murmur heard.    No friction rub. No gallop.  Pulmonary:      Effort: Pulmonary effort is normal. No respiratory distress.     Breath sounds: Normal breath sounds. No wheezing, rhonchi or rales.  Abdominal:     General: Bowel sounds are normal. There is no distension.     Palpations: Abdomen is soft.     Tenderness: There is no abdominal tenderness. There is no guarding or rebound.  Musculoskeletal:     Cervical back: Neck supple.     Right lower leg: No edema.     Left lower leg: No edema.  Skin:    General: Skin is warm and dry.     Coloration: Skin is not jaundiced or pale.  Neurological:     General: No focal deficit present.     Mental Status: She is alert and oriented to person, place, and time. Mental status is at baseline.  Psychiatric:        Mood and Affect: Mood normal.        Behavior: Behavior normal.        Thought Content: Thought content normal.        Judgment: Judgment normal.     Laboratory Data Recent Labs  Lab 05/12/22 2119  WBC 3.5*  HGB 13.1  HCT 40.5  PLT 196   Recent Labs  Lab 05/12/22 2119  NA 141  K 3.3*  CL 109  CO2 24  BUN 18  CALCIUM 9.0  PROT 6.7  BILITOT 0.5  ALKPHOS 56  ALT 13  AST 25  GLUCOSE 90   Recent Labs  Lab 05/13/22 0027  INR 1.4*    Recent Labs    05/12/22 2119  LIPASE 37        Imaging Studies: MR ABDOMEN MRCP WO CONTRAST  Result Date: 05/13/2022 CLINICAL DATA:  Right flank pain, cholelithiasis, nephrolithiasis EXAM: MRI ABDOMEN WITHOUT CONTRAST  (INCLUDING MRCP) TECHNIQUE: Multiplanar multisequence MR imaging of the abdomen was performed. Heavily T2-weighted images of the biliary and pancreatic ducts were obtained, and three-dimensional MRCP images were rendered by post processing. COMPARISON:  CT 05/09/2022 FINDINGS: Lower chest: Cardiac size is mildly enlarged. Hepatobiliary: Cholelithiasis without pericholecystic inflammatory change. Choledocholithiasis again noted with 4 x 7 mm calculus noted within the distal common duct. The common duct is not dilated, measuring  up to 7 mm in diameter. No intrahepatic biliary ductal dilation. Liver unremarkable. Pancreas: Complete pancreatic divisum; the dorsal duct drains to the minor papilla, best seen on image # 17, series 3. A 10 mm x 11 mm multi cystic lesion is seen within the head of the pancreas at axial image # 19, series 4 demonstrating no significant soft tissue component multiple thin internal septa. While not optimally assessed in absence of contrast administration, this is most suggestive of a serous cystadenoma. No peripancreatic inflammatory change. The pancreatic duct is not dilated. Spleen:  Within normal limits in size and appearance. Adrenals/Urinary Tract: The adrenal glands are unremarkable. The kidneys are normal in size and position. Multiple peripelvic cysts are seen on the left. On the right, mild right hydronephrosis is again identified and there is now moderate perinephric edema. Hydronephrosis now extends into the right hemipelvis, best seen on image # 13, series 3 suggesting distal migration of the previously noted obstructing calculus though the calculus itself is not well visualized on this examination. Stomach/Bowel: Visualized portions within the abdomen are unremarkable. Vascular/Lymphatic:  No pathologically enlarged lymph nodes identified. No abdominal aortic aneurysm demonstrated. Other: Tiny fat containing umbilical hernia noted. Mild subcutaneous edema within the lumbar soft tissues. Musculoskeletal: There is artifact related to extensive thoracolumbar fusion with instrumentation this limits evaluation of the osseous structures IMPRESSION: 1. Cholelithiasis and choledocholithiasis. No intrahepatic or extrahepatic biliary ductal dilation. 2. Complete pancreatic divisum. 3. Multi cystic lesion within the head of the pancreas measuring 10 x 11 mm. While not optimally assessed in absence of contrast administration, this is most suggestive of a serous cystadenoma. 4. Mild right hydronephrosis is again  identified and there is now moderate perinephric edema. Hydronephrosis now extends into the right hemipelvis, suggesting distal migration of the previously noted obstructing calculus though the calculus itself is not well visualized on this examination. Electronically Signed   By: Fidela Salisbury M.D.   On: 05/13/2022 02:07   MR 3D Recon At Scanner  Result Date: 05/13/2022 CLINICAL DATA:  Right flank pain, cholelithiasis, nephrolithiasis EXAM: MRI ABDOMEN WITHOUT CONTRAST  (INCLUDING MRCP) TECHNIQUE: Multiplanar multisequence MR imaging of the abdomen was performed. Heavily T2-weighted images of the biliary and pancreatic ducts were obtained, and three-dimensional MRCP images were rendered by post processing. COMPARISON:  CT 05/09/2022 FINDINGS: Lower chest: Cardiac size is mildly enlarged. Hepatobiliary: Cholelithiasis without pericholecystic inflammatory change. Choledocholithiasis again noted with 4 x 7 mm calculus noted within the distal common duct. The common duct is not dilated, measuring up to 7 mm in diameter. No intrahepatic biliary ductal dilation. Liver unremarkable. Pancreas: Complete pancreatic divisum; the dorsal duct drains to the minor papilla, best seen on image # 17, series 3. A 10 mm x 11 mm multi cystic lesion is seen within the head of the pancreas at axial image # 19, series 4 demonstrating no significant soft tissue component multiple thin internal septa. While not optimally assessed in absence of contrast administration, this is most suggestive of a serous cystadenoma. No peripancreatic inflammatory change. The pancreatic duct is not dilated. Spleen:  Within normal limits in size and appearance. Adrenals/Urinary Tract: The adrenal glands are unremarkable. The kidneys are normal in size and position. Multiple peripelvic cysts are seen on the left. On the right, mild right hydronephrosis is again identified and there is now moderate perinephric edema. Hydronephrosis now extends into the  right hemipelvis, best seen on image # 13, series 3 suggesting distal migration of the previously noted obstructing calculus though the calculus itself is not well visualized on this examination. Stomach/Bowel: Visualized portions within the abdomen are unremarkable. Vascular/Lymphatic: No pathologically enlarged lymph nodes identified. No abdominal aortic aneurysm demonstrated. Other: Tiny fat containing umbilical hernia noted. Mild subcutaneous edema within the lumbar soft tissues. Musculoskeletal: There is artifact related to extensive thoracolumbar fusion with instrumentation this limits evaluation of the osseous structures IMPRESSION: 1. Cholelithiasis and choledocholithiasis. No intrahepatic or extrahepatic biliary ductal dilation. 2. Complete pancreatic divisum. 3. Multi cystic lesion within the head of the pancreas measuring 10 x 11 mm. While not optimally assessed in absence of contrast administration, this is most suggestive of a serous cystadenoma. 4. Mild right hydronephrosis is again identified and there is now moderate perinephric edema. Hydronephrosis now extends into the right hemipelvis, suggesting distal migration of the previously noted obstructing calculus though the calculus itself is not well visualized on this examination. Electronically Signed   By: Fidela Salisbury M.D.   On: 05/13/2022 02:07    Assessment:   # Choledocholithiasis - CT on 8/17 and MRCP on 8/21 revealing choledocholithiasis - lfts and  wbc wnl; currently without cholangitis - 4x27m stone in distal CBD; no IH dilation - cbd measuring ~8 mm  # Pancreatic cyst - [id number] cystic lesion at the head of the pancreas with honey combing features - mrcp remarking on serous cystadenoma which is benign - no PD Dilation - recommended repeat mri with contrast as outpatient - can potentially consider EUS as outpatient  # complete pancreatic divisum - dorsal duct draining to minor papilla  # A fib on coumadin - inr  1.4  # Right sided abdominal pain  # constipation - moderate stool on imaging in the colon  # Zenker's diverticulum s/p repair - surgery 02/2012  # R hydronephrosis with stone - urology following  Plan:  Plan for ERCP tomorrow after d/w ERCP provider pending endoscopy unit and provider availability Hold dvt ppx and coumadin Continue abx - currently on cefepime and flagyl per primary NPO at midnight Am labs- cbc, inr, cmp Recommend bowel regimen post endoscopic intervention- including miralax and docusate  Endoscopic retrograde cholangiopancreatography with possible sphincterotomy, biopsy, brushings, stone/sludge removal, stent placement and interventions as necessary has been discussed with the patient/patient representative. Informed consent was obtained from the patient/patient representative after explaining the indication, nature, and risks of the procedure including but not limited to death, bleeding, perforation, infection, pancreatitis, missed neoplasm/lesions, cardiorespiratory compromise, and reaction to medications. Opportunity for questions was given and appropriate answers were provided. Patient/patient representativehas verbalized understanding is amenable to undergoing the procedure.  I personally performed the service.  Management of other medical comorbidities as per primary team  Thank you for allowing [facility] participate in this patient's care. Please don't hesitate to call if any questions or concerns arise.   StAnnamaria HellingDO [facility]  Portions of the record may have been created with voice recognition software. Occasional wrong-word or 'sound-a-like' substitutions may have occurred due to the inherent limitations of voice recognition software.  Read the chart carefully and recognize, using context, where substitutions may have occurred.

## 2022-05-13 NOTE — Progress Notes (Signed)
Pharmacy Antibiotic Note  Kelly Bishop is a 88 y.o. female admitted on 05/13/2022 with UTI, hx of gallstones, renal stones.  Pharmacy has been consulted for Cefepime dosing.  Plan: Zoysn 3.375 gm IV X 1 given in ED on 8/21 @ 0216. Cefepime 2 gm IV Q12H ordered to start on 8/21 @ 0800.   Height: '5\' 3"'$  (160 cm) Weight: 64.9 kg (143 lb) IBW/kg (Calculated) : 52.4  Temp (24hrs), Avg:98.1 F (36.7 C), Min:97.8 F (36.6 C), Max:98.3 F (36.8 C)  Recent Labs  Lab 05/12/22 2119  WBC 3.5*  CREATININE 1.16*    Estimated Creatinine Clearance: 30.4 mL/min (A) (by C-G formula based on SCr of 1.16 mg/dL (H)).    Allergies  Allergen Reactions   Ace Inhibitors Cough   Beta Adrenergic Blockers Other (See Comments)   Morphine And Related Nausea And Vomiting   Peanut-Containing Drug Products Hives   Codeine Nausea Only and Rash    Antimicrobials this admission:   >>    >>   Dose adjustments this admission:   Microbiology results:  BCx:  UCx:    Sputum:    MRSA PCR:   Thank you for allowing pharmacy to be a part of this patient's care.  Dayveon Halley D 05/13/2022 3:41 AM

## 2022-05-13 NOTE — ED Notes (Signed)
Pt taken to MRI at this time

## 2022-05-13 NOTE — Assessment & Plan Note (Addendum)
Secondary to transient obstructing stone.  Seen by urology.  Started on cefepime plus Flomax and IV fluids.  Urology feels stone should pass on its own.

## 2022-05-13 NOTE — H&P (Signed)
History and Physical    Patient: Kelly Bishop PFX:[id number] DOB: 1932/05/26 DOA: 05/13/2022 DOS: the patient was seen and examined on 05/13/2022 PCP: [name], MD  Patient coming from: Home  Chief Complaint:  Chief Complaint  Patient presents with   Abdominal Pain    HPI: Kelly Bishop is a 88 y.o. female with medical history significant for CKD stage IIIa, paroxysmal A-fib on amiodarone and Coumadin, depression, hypothyroidism, PAD, chronic spine infection on daily ofloxacin, Zenker's diverticulum on Reglan, who presents to the ED with right flank pain.  Patient was seen by her PCP Dr. Sabra Heck on 8/16 with a complaint of right lower quadrant pain and concerns for intermittent rectal bleeding.  Patient has a history of hemorrhoids and says she had been seeing red coloration in the commode, not mixed in with stool but was not sure if it was related to hemorrhoids when her urine.  Her PCP reduced her Coumadin for the week and she was started on Flagyl and cefuroxime.  CT scan on 8/17 showed right hydroureteronephrosis with a 3 mm mid/distal obstructing ureteral calculus along with choledocholithiasis with CBD of 5 mm without findings of cholecystitis.  Patient presented to the ED due to worsening pain.  She denied fever or chills.  .  Has no nausea or vomiting. ED course and data review: Afebrile with pulse 62 and BP 182/89, O2 sat 97% on room air.  Labs with WBC 3500 and otherwise unremarkable CBC.  CMP significant only for potassium of 3.3.  Creatinine 1.16 at baseline.  Lipase and LFTs WNL.  Urinalysis with large hemoglobin and moderate leukocyte esterase no bacteria seen.  INR 1.4. EKG, personally viewed and interpreted with NSR at 63 with nonspecific ST-T wave changes. MRCP shows the following:  IMPRESSION: 1. Cholelithiasis and choledocholithiasis. No intrahepatic or extrahepatic biliary ductal dilation. 2. Complete pancreatic divisum. 3. Multi cystic lesion within the head of  the pancreas measuring 10 x 11 mm. While not optimally assessed in absence of contrast administration, this is most suggestive of a serous cystadenoma. 4. Mild right hydronephrosis is again identified and there is now moderate perinephric edema. Hydronephrosis now extends into the right hemipelvis, suggesting distal migration of the previously noted obstructing calculus though the calculus itself is not well visualized on this examination.   Patient started on Zosyn.   The ED provider spoke with gastroenterologist Dr. Virgina Jock who advised that ERCP can be performed on 04/2107/22 Hospitalist consulted for admission.  Patient started on Zosyn     Past Medical History:  Diagnosis Date   Actinic keratosis 05/17/2008   L upper lip - bx proven   Actinic keratosis 10/06/2014   R mid pretibial med - bx proven   Actinic keratosis 11/23/2020   upper back spinal, bx proven   Anemia    Atrial fibrillation ([place])    B12 deficiency    Basal cell carcinoma 08/08/2009   R ant deltoid lat    Basal cell carcinoma 08/08/2009   R ant deltoid med   Basal cell carcinoma 07/31/2010   R mid pretibial   Basal cell carcinoma 10/06/2014   L distal ant thigh   Basal cell carcinoma 10/11/2015   L med distal tricep near elbow   Basal cell carcinoma 02/19/2017   R prox mandible - excision 05/27/2017   Basal cell carcinoma 04/23/2017   L distal pretibial    Basal cell carcinoma 01/25/2020   R forehead above lat brow   Breast cancer ([place]) 2015  Right   Breast cancer, right breast ([name])    right breast Invasive mammary carcinoma grade 1   Cataracts, bilateral    COPD (chronic obstructive pulmonary disease) ([name])    Hemorrhoids    Lymphopenia    Osteoarthritis    Osteoporosis July 2015   seen on DEXA scan-T score of -2.9 in the left neck femur   Pancytopenia ([name])    Personal history of radiation therapy    Skin cancer    Spastic dysphonia    Squamous cell carcinoma of skin 06/22/2007   R mid  pretibial   Squamous cell carcinoma of skin 05/17/2008   R pretibial    Squamous cell carcinoma of skin 08/08/2009   R dorsum prox wrist   Squamous cell carcinoma of skin 10/06/2014   L pretibial    Squamous cell carcinoma of skin 12/08/2017   L pretibial    Squamous cell carcinoma of skin 07/26/2019   L mid lat pretibial    Thrombocytopenia ([name])    Unsteady gait    Zenker's diverticulum    Past Surgical History:  Procedure Laterality Date   BREAST EXCISIONAL BIOPSY Left    x 20 years surgical bx   BREAST EXCISIONAL BIOPSY Left 01/24/2016   ultrasound - Papaloma   BREAST LUMPECTOMY Right 2015   BREAST LUMPECTOMY WITH NEEDLE LOCALIZATION Left 03/12/2016   Procedure: BREAST LUMPECTOMY WITH NEEDLE LOCALIZATION;  Surgeon: Leonie Green, MD;  Location: [facility] ORS;  Service: General;  Laterality: Left;   HYSTEROSCOPY WITH D & C N/A 07/05/2016   Procedure: DILATATION AND CURETTAGE /HYSTEROSCOPY;  Surgeon: [name], MD;  Location: [facility] ORS;  Service: Gynecology;  Laterality: N/A;   L1-L4 laminectomy and T6 fusion  May 0350   complicated by postoperative infection, more recently had recurrent pseudomonas infection at scar site March 2015   REPLACEMENT TOTAL KNEE Bilateral    ROTATOR CUFF REPAIR Bilateral    ultrasound guided biopsy of breast  Jan 27, 2014   Social History:  reports that she has never smoked. She has never used smokeless tobacco. She reports that she does not drink alcohol and does not use drugs.  Allergies  Allergen Reactions   Ace Inhibitors Cough   Beta Adrenergic Blockers Other (See Comments)   Morphine And Related Nausea And Vomiting   Peanut-Containing Drug Products Hives   Codeine Nausea Only and Rash    Family History  Problem Relation Age of Onset   Throat cancer Mother    Prostate cancer Brother    Breast cancer Brother    Leukemia Brother    Lung cancer Father     Prior to Admission medications   Medication Sig Start Date End Date Taking?  Authorizing Provider  ALPRAZolam Duanne Moron) 0.5 MG tablet Take 0.25 mg by mouth at bedtime.    [provider]  amiodarone (PACERONE) 200 MG tablet Take 300 mg by mouth daily. 10/02/16 05/31/21  [provider]  amoxicillin (AMOXIL) 500 MG capsule 1,000 mg. Prior to dental procedures 04/04/17   [provider]  anastrozole (ARIMIDEX) 1 MG tablet Take 1 tablet (1 mg total) by mouth daily. Patient not taking: No sig reported 07/22/16   Cammie Sickle, MD  aspirin 81 MG chewable tablet Chew 81 mg by mouth daily.     [provider]  bisacodyl (DULCOLAX) 5 MG EC tablet Take 10 mg by mouth at bedtime.     [provider]  Calcium Carbonate-Vit D-Min (CALCIUM 600+D PLUS MINERALS) 600-400  MG-UNIT CHEW Chew 1 Dose by mouth daily.     [provider]  Cholecalciferol 2000 units CAPS Take 1 capsule by mouth daily.    [provider]  ciprofloxacin (CIPRO) 500 MG tablet Take 500 mg by mouth 2 (two) times daily.     [provider]  Cyanocobalamin 1000 MCG TBCR Take 1,000 mcg by mouth daily.     [provider]  denosumab (PROLIA) 60 MG/ML SOLN injection Inject into the skin.    [provider]  docusate sodium (COLACE) 100 MG capsule Take 1 capsule (100 mg total) by mouth daily as needed for mild constipation. Patient not taking: No sig reported 07/05/16   [name], MD  fexofenadine (ALLEGRA) 180 MG tablet Take 180 mg by mouth daily.    [provider]  furosemide (LASIX) 40 MG tablet Take 40 mg by mouth daily as needed.     [provider]  mometasone (ELOCON) 0.1 % cream Apply BID to affected areas as directed 11/23/20   Ralene Bathe, MD  mupirocin ointment (BACTROBAN) 2 % Place 1 application into the nose daily. Qd t excision site 01/25/20   Ralene Bathe, MD  ondansetron (ZOFRAN ODT) 4 MG disintegrating tablet Take 1 tablet (4 mg total) by mouth every 8 (eight) hours as needed for  nausea or vomiting. Patient not taking: No sig reported 07/05/16   [name], MD  potassium chloride (K-DUR) 10 MEQ tablet Take 10 mEq by mouth daily as needed.    [provider]  warfarin (COUMADIN) 5 MG tablet Take 2.5 mg by mouth daily at 6 PM. Alternates with '6mg'$  tablet i.e.6, 2.5,6.2.5,6,2.5 05/05/15 05/31/21  [provider]  warfarin (COUMADIN) 6 MG tablet Take 6 mg by mouth daily at 6 PM. Alternates with '5mg'$  tablet i.e. 6, 2.5, 6, 2.5 05/11/15 05/31/21  [provider]    Physical Exam: Vitals:   05/13/22 0024 05/13/22 0141 05/13/22 0144 05/13/22 0259  BP: (!) 187/76 (!) 151/65  (!) 163/92  Pulse: 60 62  62  Resp: '19 17  20  '$ Temp:   97.8 F (36.6 C)   TempSrc:   Oral   SpO2: 99% 98%  99%  Weight:      Height:       Physical Exam Vitals and nursing note reviewed.  Constitutional:      General: She is not in acute distress. HENT:     Head: Normocephalic and atraumatic.  Cardiovascular:     Rate and Rhythm: Normal rate and regular rhythm.     Heart sounds: Normal heart sounds.  Pulmonary:     Effort: Pulmonary effort is normal.     Breath sounds: Normal breath sounds.  Abdominal:     Palpations: Abdomen is soft.     Tenderness: There is abdominal tenderness in the right lower quadrant. There is no right CVA tenderness. Negative signs include Murphy's sign.  Neurological:     Mental Status: Mental status is at baseline.     Labs on Admission: I have personally reviewed following labs and imaging studies  CBC: Recent Labs  Lab 05/12/22 2119  WBC 3.5*  HGB 13.1  HCT 40.5  MCV 91.0  PLT 322   Basic Metabolic Panel: Recent Labs  Lab 05/12/22 2119  NA 141  K 3.3*  CL 109  CO2 24  GLUCOSE 90  BUN 18  CREATININE 1.16*  CALCIUM 9.0   GFR: Estimated Creatinine Clearance: 30.4 mL/min (A) (by C-G  formula based on SCr of 1.16 mg/dL (H)). Liver Function Tests: Recent Labs  Lab 05/12/22 2119  AST 25  ALT 13  ALKPHOS 56   BILITOT 0.5  PROT 6.7  ALBUMIN 3.8   Recent Labs  Lab 05/12/22 2119  LIPASE 37   No results for input(s): "AMMONIA" in the last 168 hours. Coagulation Profile: Recent Labs  Lab 05/13/22 0027  INR 1.4*   Cardiac Enzymes: No results for input(s): "CKTOTAL", "CKMB", "CKMBINDEX", "TROPONINI" in the last 168 hours. BNP (last 3 results) No results for input(s): "PROBNP" in the last 8760 hours. HbA1C: No results for input(s): "HGBA1C" in the last 72 hours. CBG: No results for input(s): "GLUCAP" in the last 168 hours. Lipid Profile: No results for input(s): "CHOL", "HDL", "LDLCALC", "TRIG", "CHOLHDL", "LDLDIRECT" in the last 72 hours. Thyroid Function Tests: No results for input(s): "TSH", "T4TOTAL", "FREET4", "T3FREE", "THYROIDAB" in the last 72 hours. Anemia Panel: No results for input(s): "VITAMINB12", "FOLATE", "FERRITIN", "TIBC", "IRON", "RETICCTPCT" in the last 72 hours. Urine analysis:    Component Value Date/Time   COLORURINE AMBER (A) 05/12/2022 2119   APPEARANCEUR CLOUDY (A) 05/12/2022 2119   APPEARANCEUR Clear 08/13/2014 2204   LABSPEC 1.019 05/12/2022 2119   LABSPEC 1.003 08/13/2014 2204   PHURINE 6.0 05/12/2022 2119   GLUCOSEU NEGATIVE 05/12/2022 2119   GLUCOSEU Negative 08/13/2014 2204   HGBUR LARGE (A) 05/12/2022 2119   BILIRUBINUR NEGATIVE 05/12/2022 2119   BILIRUBINUR Negative 12/28/2012 [place] 05/12/2022 2119   PROTEINUR 100 (A) 05/12/2022 2119   NITRITE NEGATIVE 05/12/2022 2119   LEUKOCYTESUR MODERATE (A) 05/12/2022 2119   LEUKOCYTESUR Negative 08/13/2014 2204    Radiological Exams on Admission: MR ABDOMEN MRCP WO CONTRAST  Result Date: 05/13/2022 CLINICAL DATA:  Right flank pain, cholelithiasis, nephrolithiasis EXAM: MRI ABDOMEN WITHOUT CONTRAST  (INCLUDING MRCP) TECHNIQUE: Multiplanar multisequence MR imaging of the abdomen was performed. Heavily T2-weighted images of the biliary and pancreatic ducts were obtained, and  three-dimensional MRCP images were rendered by post processing. COMPARISON:  CT 05/09/2022 FINDINGS: Lower chest: Cardiac size is mildly enlarged. Hepatobiliary: Cholelithiasis without pericholecystic inflammatory change. Choledocholithiasis again noted with 4 x 7 mm calculus noted within the distal common duct. The common duct is not dilated, measuring up to 7 mm in diameter. No intrahepatic biliary ductal dilation. Liver unremarkable. Pancreas: Complete pancreatic divisum; the dorsal duct drains to the minor papilla, best seen on image # 17, series 3. A 10 mm x 11 mm multi cystic lesion is seen within the head of the pancreas at axial image # 19, series 4 demonstrating no significant soft tissue component multiple thin internal septa. While not optimally assessed in absence of contrast administration, this is most suggestive of a serous cystadenoma. No peripancreatic inflammatory change. The pancreatic duct is not dilated. Spleen:  Within normal limits in size and appearance. Adrenals/Urinary Tract: The adrenal glands are unremarkable. The kidneys are normal in size and position. Multiple peripelvic cysts are seen on the left. On the right, mild right hydronephrosis is again identified and there is now moderate perinephric edema. Hydronephrosis now extends into the right hemipelvis, best seen on image # 13, series 3 suggesting distal migration of the previously noted obstructing calculus though the calculus itself is not well visualized on this examination. Stomach/Bowel: Visualized portions within the abdomen are unremarkable. Vascular/Lymphatic: No pathologically enlarged lymph nodes identified. No abdominal aortic aneurysm demonstrated. Other: Tiny fat containing umbilical hernia noted. Mild subcutaneous edema within the lumbar soft tissues. Musculoskeletal: There  is artifact related to extensive thoracolumbar fusion with instrumentation this limits evaluation of the osseous structures IMPRESSION: 1.  Cholelithiasis and choledocholithiasis. No intrahepatic or extrahepatic biliary ductal dilation. 2. Complete pancreatic divisum. 3. Multi cystic lesion within the head of the pancreas measuring 10 x 11 mm. While not optimally assessed in absence of contrast administration, this is most suggestive of a serous cystadenoma. 4. Mild right hydronephrosis is again identified and there is now moderate perinephric edema. Hydronephrosis now extends into the right hemipelvis, suggesting distal migration of the previously noted obstructing calculus though the calculus itself is not well visualized on this examination. Electronically Signed   By: Fidela Salisbury M.D.   On: 05/13/2022 02:07   MR 3D Recon At Scanner  Result Date: 05/13/2022 CLINICAL DATA:  Right flank pain, cholelithiasis, nephrolithiasis EXAM: MRI ABDOMEN WITHOUT CONTRAST  (INCLUDING MRCP) TECHNIQUE: Multiplanar multisequence MR imaging of the abdomen was performed. Heavily T2-weighted images of the biliary and pancreatic ducts were obtained, and three-dimensional MRCP images were rendered by post processing. COMPARISON:  CT 05/09/2022 FINDINGS: Lower chest: Cardiac size is mildly enlarged. Hepatobiliary: Cholelithiasis without pericholecystic inflammatory change. Choledocholithiasis again noted with 4 x 7 mm calculus noted within the distal common duct. The common duct is not dilated, measuring up to 7 mm in diameter. No intrahepatic biliary ductal dilation. Liver unremarkable. Pancreas: Complete pancreatic divisum; the dorsal duct drains to the minor papilla, best seen on image # 17, series 3. A 10 mm x 11 mm multi cystic lesion is seen within the head of the pancreas at axial image # 19, series 4 demonstrating no significant soft tissue component multiple thin internal septa. While not optimally assessed in absence of contrast administration, this is most suggestive of a serous cystadenoma. No peripancreatic inflammatory change. The pancreatic duct is not  dilated. Spleen:  Within normal limits in size and appearance. Adrenals/Urinary Tract: The adrenal glands are unremarkable. The kidneys are normal in size and position. Multiple peripelvic cysts are seen on the left. On the right, mild right hydronephrosis is again identified and there is now moderate perinephric edema. Hydronephrosis now extends into the right hemipelvis, best seen on image # 13, series 3 suggesting distal migration of the previously noted obstructing calculus though the calculus itself is not well visualized on this examination. Stomach/Bowel: Visualized portions within the abdomen are unremarkable. Vascular/Lymphatic: No pathologically enlarged lymph nodes identified. No abdominal aortic aneurysm demonstrated. Other: Tiny fat containing umbilical hernia noted. Mild subcutaneous edema within the lumbar soft tissues. Musculoskeletal: There is artifact related to extensive thoracolumbar fusion with instrumentation this limits evaluation of the osseous structures IMPRESSION: 1. Cholelithiasis and choledocholithiasis. No intrahepatic or extrahepatic biliary ductal dilation. 2. Complete pancreatic divisum. 3. Multi cystic lesion within the head of the pancreas measuring 10 x 11 mm. While not optimally assessed in absence of contrast administration, this is most suggestive of a serous cystadenoma. 4. Mild right hydronephrosis is again identified and there is now moderate perinephric edema. Hydronephrosis now extends into the right hemipelvis, suggesting distal migration of the previously noted obstructing calculus though the calculus itself is not well visualized on this examination. Electronically Signed   By: Fidela Salisbury M.D.   On: 05/13/2022 02:07     Data Reviewed: Relevant notes from primary care and specialist visits, past discharge summaries as available in EHR, including Care Everywhere. Prior diagnostic testing as pertinent to current admission diagnoses Updated medications and problem  lists for reconciliation ED course, including vitals, labs, imaging, treatment and  response to treatment Triage notes, nursing and pharmacy notes and ED provider's notes Notable results as noted in HPI   Assessment and Plan: Hydronephrosis with obstructing calculus, right Acute pyelonephritis Continue IV Zosyn Urology consulted Pain management and supportive care Follow cultures Keeping n.p.o. in case of procedure and will hold Coumadin SCD for DVT prophylaxis   Cholelithiasis with choledocholithiasis Multicystic head of pancreas lesion GI has been consulted MRCP showed cholelithiasis and choledocholithiasis. No intrahepatic or extrahepatic biliary ductal dilation. Complete pancreatic divisum with Multi cystic lesion within the head of the pancreas ...most suggestive of a serous cystadenoma. Pain control We will keep n.p.o. in case of procedure Holding Coumadin  Long term (current) use of antibiotics Patient is on long-term daily Cipro.  Per PCP note for spinal infection  PAD (peripheral artery disease) ([place]) Resume aspirin when off n.p.o. status  COPD (chronic obstructive pulmonary disease) ([name]) DuoNebs as needed  Stage 3a chronic kidney disease ([place]) Renal function at baseline  Atrial fibrillation ([name]) Continue amiodarone Holding Coumadin in case of procedure.  INR 1.4  Benign essential hypertension Continue Lasix        DVT prophylaxis: SCDs  Consults: GI, Dr. Virgina Jock neurology Dr. Cain Sieve  Advance Care Planning: DNR  Family Communication: none  Disposition Plan: Back to previous home environment  Severity of Illness: The appropriate patient status for this patient is INPATIENT. Inpatient status is judged to be reasonable and necessary in order to provide the required intensity of service to ensure the patient's safety. The patient's presenting symptoms, physical exam findings, and initial radiographic and laboratory data in the context of their chronic  comorbidities is felt to place them at high risk for further clinical deterioration. Furthermore, it is not anticipated that the patient will be medically stable for discharge from the hospital within 2 midnights of admission.   * I certify that at the point of admission it is my clinical judgment that the patient will require inpatient hospital care spanning beyond 2 midnights from the point of admission due to high intensity of service, high risk for further deterioration and high frequency of surveillance required.*  Author: Athena Masse, MD 05/13/2022 3:10 AM  For on call review [email or web address].

## 2022-05-13 NOTE — Progress Notes (Signed)
       CROSS COVER NOTE  NAME: Kelly Bishop MRN: [id number] DOB : Jun 19, 1932    Date of Service   05/13/22  HPI/Events of Note   PDMP checked  Interventions   Plan: 0.25 mg Xanax      This document was prepared using Dragon voice recognition software and may include unintentional dictation errors.  Neomia Glass DNP, MHA, FNP-BC Nurse Practitioner [facility] Pager [phone]

## 2022-05-13 NOTE — Assessment & Plan Note (Addendum)
GI consulted.  Plans to take patient for EGD on 8/22.  In the meantime, allowing for full liquids.

## 2022-05-13 NOTE — Assessment & Plan Note (Addendum)
Resume aspirin

## 2022-05-13 NOTE — Progress Notes (Signed)
[facility] Progress Note  Patient: Kelly Bishop    XBL:[id number]  DOA: 05/13/2022    Date of Service: the patient was seen and examined on 05/13/2022  Brief hospital course: 88 year old female with past medical history of stage IIIa chronic kidney disease paroxysmal atrial fibrillation, chronic spine infection on daily ofloxacin and hypothyroidism who presented to the emergency room on the night of 8/20 with worsening right lower quadrant pain and intermittent rectal bleeding.  Symptoms have been going on for the past 5 to 6 days and patient had seen her PCP on 8/16.  CT scan done at that time noted right hydroureteronephrosis with a 3 mm mid to distal obstructing stone.  An MRI of the abdomen and pelvis done in the emergency department noted same mild right hydronephrosis, but now with moderate perinephric edema and hydronephrosis extending into the right hemipelvis suggesting distal migration of previously seen stone.  Patient was admitted to the hospitalist service and put on IV fluids and antibiotics plus pain control.  Urology consulted and felt no intervention needed and with fluids, stone should pass on its own.  Patient seen by GI with plans for endoscopy on 8/22.  Assessment and Plan: Assessment and Plan: Hydronephrosis with obstructing calculus, right Secondary to transient obstructing stone.  Seen by urology.  Started on cefepime plus Flomax and IV fluids.  Urology feels stone should pass on its own.   Acute pyelonephritis Secondary to obstructing stone.  Urine and blood cultures pending.  On cefepime.  Patient tolerating well.  Cholelithiasis with choledocholithiasis GI consulted.  Plans to take patient for EGD on 8/22.  In the meantime, allowing for full liquids.    Cystic mass of pancreas Appears to be incidental finding.  GI seeing with plans for endoscopy tomorrow.  Atrial fibrillation ([name]) Continue amiodarone Holding Coumadin in case of procedure.  INR  1.4  Benign essential hypertension Antihypertensives initially held given fluid resuscitation.  Blood pressure trending up.  Rechecking basic metabolic panel and likely will KVO IV fluids and restart medication.  As needed hydralazine.  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  PAD (peripheral artery disease) ([name]) Resume aspirin when off n.p.o. status  COPD (chronic obstructive pulmonary disease) ([name]) DuoNebs as needed  Stage 3a chronic kidney disease ([place]) Renal function at baseline, although on low end of normal on admission.  Recheck labs.       Body mass index is 25.33 kg/m.        Consultants: Urology Gastroenterology  Procedures: Plan for EGD 8/22  Antimicrobials: IV cefepime 8/21-present  Code Status: DNR   Subjective: Patient doing okay, still with some right flank pain  Objective: Noted elevated blood pressures Vitals:   05/13/22 0902 05/13/22 1544  BP: (!) 155/68 (!) 152/69  Pulse: (!) 57 61  Resp: 18 18  Temp: 98.6 F (37 C) 98.8 F (37.1 C)  SpO2: 96% 96%    Intake/Output Summary (Last 24 hours) at 05/13/2022 1623 Last data filed at 05/13/2022 1244 Gross per 24 hour  Intake 1009.66 ml  Output 1100 ml  Net -90.34 ml   Filed Weights   05/12/22 2112  Weight: 64.9 kg   Body mass index is 25.33 kg/m.  Exam:  General: Alert and oriented x3, no acute distress HEENT: Cephalic, atraumatic, mucous membranes slightly dry Cardiovascular: Regular rate and rhythm, S1-S2 Respiratory: Clear to auscultation bilaterally, moderate inspiratory effort Abdomen: Soft, nontender,  nondistended, hypoactive bowel sounds Musculoskeletal: No clubbing or cyanosis, trace pitting edema Skin: Skin breaks, tears or lesions Psychiatry: [employer], no evidence of psychoses Neurology: No focal deficits  Data Reviewed: Basic metabolic panel from 8/87 pending INR  1.4  Disposition:  Status is: Inpatient Remains inpatient appropriate because:  Planned EGD 8/22 -Resumption of Coumadin  Anticipated discharge date: 8/22 or 8/23  Family Communication: Brother at the bedside DVT Prophylaxis: SCDs Start: 05/13/22 0329    Author: Annita Brod ,MD 05/13/2022 4:23 PM  To reach On-call, see care teams to locate the attending and reach out via [email or web address]. Between 7PM-7AM, please contact night-coverage If you still have difficulty reaching the attending provider, please page the [facility] (Director on Call) for [facility] on amion for assistance.

## 2022-05-13 NOTE — ED Notes (Signed)
EDP Sung in room at this time.

## 2022-05-13 NOTE — Assessment & Plan Note (Addendum)
Not on home antihypertensives other than Lasix daily PRN.   As needed hydralazine.

## 2022-05-13 NOTE — Assessment & Plan Note (Addendum)
Secondary to obstructing ureteral stone.   Treated with cefepime.  Antibiotics completed.

## 2022-05-13 NOTE — Hospital Course (Addendum)
88 year old female with past medical history of stage IIIa chronic kidney disease paroxysmal atrial fibrillation, chronic spine infection on daily ofloxacin and hypothyroidism who presented to the emergency room on the night of 8/20 with worsening right lower quadrant pain and intermittent rectal bleeding.  Symptoms have been going on for the past 5 to 6 days and patient had seen her PCP on 8/16.  CT scan done at that time noted right hydroureteronephrosis with a 3 mm mid to distal obstructing stone.  An MRI of the abdomen and pelvis done in the emergency department noted same mild right hydronephrosis, but now with moderate perinephric edema and hydronephrosis extending into the right hemipelvis suggesting distal migration of previously seen stone.  Patient was admitted to the hospitalist service and put on IV fluids and antibiotics plus pain control.  Urology consulted and felt no intervention needed and with fluids, stone should pass on its own.  Patient seen by GI with plans for endoscopy on 8/22.

## 2022-05-13 NOTE — Assessment & Plan Note (Addendum)
Continue amiodarone Warfarin was held pre-ERCP and peri-operatively.  Warfarin may resumed tomorrow evening 8/26. Close follow up with INR check and dosing.

## 2022-05-13 NOTE — Progress Notes (Signed)
Mobility Specialist - Progress Note    05/13/22 1028  Mobility  Activity Ambulated with assistance in hallway;Ambulated with assistance to bathroom;Stood at bedside;Dangled on edge of bed  Level of Assistance Standby assist, set-up cues, supervision of patient - no hands on  Assistive Device Other (Comment) (Single Point Cane)  Distance Ambulated (ft) 160 ft  Activity Response Tolerated well  $Mobility charge 1 Mobility   Pt supine in bed on RA upon arrival. Pt STS, ambulates in hallway, and goes to restroom supervision. Pt returns to bed with needs in reach and bed alarm set.   Gretchen Short  Mobility Specialist  05/13/22 10:30 AM

## 2022-05-13 NOTE — IPAL (Signed)
  Interdisciplinary Goals of Care Family Meeting   Date carried out: 05/13/2022  Location of the meeting: Bedside  Member's involved: Physician and Bedside Registered Nurse  Durable Power of Attorney or acting medical decision maker: patient    Discussion: We discussed goals of care for [employer] .    Code status: Full DNR  Disposition: Home  Time spent for the meeting: [place], [place]  05/13/2022, 4:40 AM

## 2022-05-13 NOTE — Assessment & Plan Note (Addendum)
Renal function at baseline.   Monitor BMP.

## 2022-05-13 NOTE — Plan of Care (Signed)

## 2022-05-13 NOTE — Assessment & Plan Note (Signed)
Appears to be incidental finding.  GI seeing with plans for endoscopy tomorrow.

## 2022-05-13 NOTE — ED Provider Notes (Signed)
[facility] Provider Note    Event Date/Time   First MD Initiated Contact with Patient 05/13/22 0012     (approximate)   History   Abdominal Pain   HPI  Kelly Bishop is a 88 y.o. female who presents to the ED from home with a chief complaint of abdominal pain.  Patient has been experiencing right upper and lower quadrant abdominal pain x1 week.  Endorses occasional associated nausea and hematuria.  No exacerbation with food.  Patient was seen by her PCP and had an outpatient CT abd/pel w/ contrast 05/09/2022 which demonstrated the following:  1. Mild right hydroureteronephrosis to a 3 mm stone in the  mid/distal ureter near the pelvic inlet.  2. Additional nonobstructive right lower pole renal stone measuring  4 mm.  3. Choledocholithiasis in the distal common duct measuring 5 mm.  Prominence of the biliary tree with the common duct measuring 8 mm  within normal limits for patient's age but would suggest correlation  with laboratory values for biliary obstruction and gastroenterology  referral for possible ERCP removal of the stone.  4. Cholelithiasis without findings of acute cholecystitis. However  there is wall thickening of a nondistended gallbladder which is  favored to reflect sequela of chronic inflammation.  5.  Aortic Atherosclerosis (ICD10-I70.0).   She was referred to urology and general surgery for follow-up.  Reports increase in pain today.  Complains mostly of lower quadrant pain and urinary symptoms.  Denies fever, cough, chest pain, shortness of breath, vomiting or diarrhea.  Past Medical History   Past Medical History:  Diagnosis Date   Actinic keratosis 05/17/2008   L upper lip - bx proven   Actinic keratosis 10/06/2014   R mid pretibial med - bx proven   Actinic keratosis 11/23/2020   upper back spinal, bx proven   Anemia    Atrial fibrillation ([place])    B12 deficiency    Basal cell carcinoma 08/08/2009   R ant deltoid lat     Basal cell carcinoma 08/08/2009   R ant deltoid med   Basal cell carcinoma 07/31/2010   R mid pretibial   Basal cell carcinoma 10/06/2014   L distal ant thigh   Basal cell carcinoma 10/11/2015   L med distal tricep near elbow   Basal cell carcinoma 02/19/2017   R prox mandible - excision 05/27/2017   Basal cell carcinoma 04/23/2017   L distal pretibial    Basal cell carcinoma 01/25/2020   R forehead above lat brow   Breast cancer ([place]) 2015   Right   Breast cancer, right breast ([name])    right breast Invasive mammary carcinoma grade 1   Cataracts, bilateral    COPD (chronic obstructive pulmonary disease) ([place])    Hemorrhoids    Lymphopenia    Osteoarthritis    Osteoporosis July 2015   seen on DEXA scan-T score of -2.9 in the left neck femur   Pancytopenia ([place])    Personal history of radiation therapy    Skin cancer    Spastic dysphonia    Squamous cell carcinoma of skin 06/22/2007   R mid pretibial   Squamous cell carcinoma of skin 05/17/2008   R pretibial    Squamous cell carcinoma of skin 08/08/2009   R dorsum prox wrist   Squamous cell carcinoma of skin 10/06/2014   L pretibial    Squamous cell carcinoma of skin 12/08/2017   L pretibial    Squamous cell carcinoma of skin  07/26/2019   L mid lat pretibial    Thrombocytopenia ([name])    Unsteady gait    Zenker's diverticulum      Active Problem List   Patient Active Problem List   Diagnosis Date Noted   Hydronephrosis with obstructing calculus, right 05/13/2022   COPD (chronic obstructive pulmonary disease) ([place])    Acute pyelonephritis    Cholelithiasis with choledocholithiasis    Long term (current) use of antibiotics    Acquired hypothyroidism 09/22/2019   Stage 3a chronic kidney disease ([place]) 09/09/2018   PAD (peripheral artery disease) ([place]) 09/09/2018   Recurrent major depressive disorder, in partial remission ([place]) 09/01/2018   Osteoporosis 10/21/2016   Carcinoma of overlapping sites of right  breast in female, estrogen receptor positive ([place]) 07/22/2016   Benign essential hypertension 05/15/2016   Hyperlipidemia, mixed 05/15/2016   History of breast cancer 08/29/2014   Atrial fibrillation ([place]) 10/29/2011     Past Surgical History   Past Surgical History:  Procedure Laterality Date   BREAST EXCISIONAL BIOPSY Left    x 20 years surgical bx   BREAST EXCISIONAL BIOPSY Left 01/24/2016   ultrasound - Papaloma   BREAST LUMPECTOMY Right 2015   BREAST LUMPECTOMY WITH NEEDLE LOCALIZATION Left 03/12/2016   Procedure: BREAST LUMPECTOMY WITH NEEDLE LOCALIZATION;  Surgeon: Leonie Green, MD;  Location: [facility] ORS;  Service: General;  Laterality: Left;   HYSTEROSCOPY WITH D & C N/A 07/05/2016   Procedure: DILATATION AND CURETTAGE /HYSTEROSCOPY;  Surgeon: [name], MD;  Location: [facility] ORS;  Service: Gynecology;  Laterality: N/A;   L1-L4 laminectomy and T6 fusion  May 0881   complicated by postoperative infection, more recently had recurrent pseudomonas infection at scar site March 2015   REPLACEMENT TOTAL KNEE Bilateral    ROTATOR CUFF REPAIR Bilateral    ultrasound guided biopsy of breast  Jan 27, 2014     Home Medications   Prior to Admission medications   Medication Sig Start Date End Date Taking? Authorizing Provider  ALPRAZolam Duanne Moron) 0.5 MG tablet Take 0.25 mg by mouth at bedtime.    [provider]  amiodarone (PACERONE) 200 MG tablet Take 300 mg by mouth daily. 10/02/16 05/31/21  [provider]  amoxicillin (AMOXIL) 500 MG capsule 1,000 mg. Prior to dental procedures 04/04/17   [provider]  anastrozole (ARIMIDEX) 1 MG tablet Take 1 tablet (1 mg total) by mouth daily. Patient not taking: No sig reported 07/22/16   Cammie Sickle, MD  aspirin 81 MG chewable tablet Chew 81 mg by mouth daily.     [provider]  bisacodyl (DULCOLAX) 5 MG EC tablet Take 10 mg by mouth at bedtime.     [provider]  Calcium  Carbonate-Vit D-Min (CALCIUM 600+D PLUS MINERALS) 600-400 MG-UNIT CHEW Chew 1 Dose by mouth daily.     [provider]  Cholecalciferol 2000 units CAPS Take 1 capsule by mouth daily.    [provider]  ciprofloxacin (CIPRO) 500 MG tablet Take 500 mg by mouth 2 (two) times daily.     [provider]  Cyanocobalamin 1000 MCG TBCR Take 1,000 mcg by mouth daily.     [provider]  denosumab (PROLIA) 60 MG/ML SOLN injection Inject into the skin.    [provider]  docusate sodium (COLACE) 100 MG capsule Take 1 capsule (100 mg total) by mouth daily as needed for mild constipation. Patient not taking: No sig reported 07/05/16   [name], MD  fexofenadine (  ALLEGRA) 180 MG tablet Take 180 mg by mouth daily.    [provider]  furosemide (LASIX) 40 MG tablet Take 40 mg by mouth daily as needed.     [provider]  mometasone (ELOCON) 0.1 % cream Apply BID to affected areas as directed 11/23/20   Ralene Bathe, MD  mupirocin ointment (BACTROBAN) 2 % Place 1 application into the nose daily. Qd t excision site 01/25/20   Ralene Bathe, MD  ondansetron (ZOFRAN ODT) 4 MG disintegrating tablet Take 1 tablet (4 mg total) by mouth every 8 (eight) hours as needed for nausea or vomiting. Patient not taking: No sig reported 07/05/16   [name], MD  potassium chloride (K-DUR) 10 MEQ tablet Take 10 mEq by mouth daily as needed.    [provider]  warfarin (COUMADIN) 5 MG tablet Take 2.5 mg by mouth daily at 6 PM. Alternates with 63m tablet i.e.6, 2.5,6.2.5,6,2.5 05/05/15 05/31/21  [provider]  warfarin (COUMADIN) 6 MG tablet Take 6 mg by mouth daily at 6 PM. Alternates with 515mtablet i.e. 6, 2.5, 6, 2.5 05/11/15 05/31/21  [provider]     Allergies  Ace inhibitors, Beta adrenergic blockers, Morphine and related, Peanut-containing drug products, and Codeine   Family History   Family History   Problem Relation Age of Onset   Throat cancer Mother    Prostate cancer Brother    Breast cancer Brother    Leukemia Brother    Lung cancer Father      Physical Exam  Triage Vital Signs: ED Triage Vitals  Enc Vitals Group     BP 05/12/22 2111 (!) 182/89     Pulse Rate 05/12/22 2111 62     Resp 05/12/22 2111 20     Temp 05/12/22 2111 98.3 F (36.8 C)     Temp src --      SpO2 05/12/22 2111 97 %     Weight 05/12/22 2112 143 lb (64.9 kg)     Height 05/12/22 2112 5' 3"  (1.6 m)     Head Circumference --      Peak Flow --      Pain Score 05/12/22 2112 10     Pain Loc --      Pain Edu? --      Excl. in [place]--     Updated Vital Signs: BP (!) 179/76 (BP Location: Left Arm)   Pulse (!) 57   Temp 98.2 F (36.8 C) (Oral)   Resp 18   Ht 5' 3"  (1.6 m)   Wt 64.9 kg   SpO2 99%   BMI 25.33 kg/m    General: Awake, mild distress.  CV:  RRR.  Good peripheral perfusion.  Resp:  Normal effort.  CTA B. Abd:  Moderately tender to right lower quadrant without rebound or guarding.  Mildly tender to right upper quadrant without rebound or guarding.  No CVAT.  No distention.  Other:  No truncal vesicles.   ED Results / Procedures / Treatments  Labs (all labs ordered are listed, but only abnormal results are displayed) Labs Reviewed  COMPREHENSIVE METABOLIC PANEL - Abnormal; Notable for the following components:      Result Value   Potassium 3.3 (*)    Creatinine, Ser 1.16 (*)    GFR, Estimated 45 (*)    All other components within normal limits  CBC - Abnormal; Notable for the following components:   WBC 3.5 (*)    All other  components within normal limits  URINALYSIS, ROUTINE W REFLEX MICROSCOPIC - Abnormal; Notable for the following components:   Color, Urine AMBER (*)    APPearance CLOUDY (*)    Hgb urine dipstick LARGE (*)    Protein, ur 100 (*)    Leukocytes,Ua MODERATE (*)    RBC / HPF >50 (*)    WBC, UA >50 (*)    All other components within normal limits   PROTIME-INR - Abnormal; Notable for the following components:   Prothrombin Time 17.2 (*)    INR 1.4 (*)    All other components within normal limits  LIPASE, BLOOD  TROPONIN I (HIGH SENSITIVITY)  TROPONIN I (HIGH SENSITIVITY)     EKG  ED ECG REPORT I, Saylor Murry J, the attending physician, personally viewed and interpreted this ECG.   Date: 05/13/2022  EKG Time: 2119  Rate: 63  Rhythm: normal sinus rhythm  Axis: LAD  Intervals:none  ST&T Change: Nonspecific    RADIOLOGY I have independently visualized and interpreted patient's MRCP as well as noted the radiology interpretation:  MRCP: Cholelithiasis and choledocholithiasis  Official radiology report(s): MR ABDOMEN MRCP WO CONTRAST  Result Date: 05/13/2022 CLINICAL DATA:  Right flank pain, cholelithiasis, nephrolithiasis EXAM: MRI ABDOMEN WITHOUT CONTRAST  (INCLUDING MRCP) TECHNIQUE: Multiplanar multisequence MR imaging of the abdomen was performed. Heavily T2-weighted images of the biliary and pancreatic ducts were obtained, and three-dimensional MRCP images were rendered by post processing. COMPARISON:  CT 05/09/2022 FINDINGS: Lower chest: Cardiac size is mildly enlarged. Hepatobiliary: Cholelithiasis without pericholecystic inflammatory change. Choledocholithiasis again noted with 4 x 7 mm calculus noted within the distal common duct. The common duct is not dilated, measuring up to 7 mm in diameter. No intrahepatic biliary ductal dilation. Liver unremarkable. Pancreas: Complete pancreatic divisum; the dorsal duct drains to the minor papilla, best seen on image # 17, series 3. A 10 mm x 11 mm multi cystic lesion is seen within the head of the pancreas at axial image # 19, series 4 demonstrating no significant soft tissue component multiple thin internal septa. While not optimally assessed in absence of contrast administration, this is most suggestive of a serous cystadenoma. No peripancreatic inflammatory change. The pancreatic  duct is not dilated. Spleen:  Within normal limits in size and appearance. Adrenals/Urinary Tract: The adrenal glands are unremarkable. The kidneys are normal in size and position. Multiple peripelvic cysts are seen on the left. On the right, mild right hydronephrosis is again identified and there is now moderate perinephric edema. Hydronephrosis now extends into the right hemipelvis, best seen on image # 13, series 3 suggesting distal migration of the previously noted obstructing calculus though the calculus itself is not well visualized on this examination. Stomach/Bowel: Visualized portions within the abdomen are unremarkable. Vascular/Lymphatic: No pathologically enlarged lymph nodes identified. No abdominal aortic aneurysm demonstrated. Other: Tiny fat containing umbilical hernia noted. Mild subcutaneous edema within the lumbar soft tissues. Musculoskeletal: There is artifact related to extensive thoracolumbar fusion with instrumentation this limits evaluation of the osseous structures IMPRESSION: 1. Cholelithiasis and choledocholithiasis. No intrahepatic or extrahepatic biliary ductal dilation. 2. Complete pancreatic divisum. 3. Multi cystic lesion within the head of the pancreas measuring 10 x 11 mm. While not optimally assessed in absence of contrast administration, this is most suggestive of a serous cystadenoma. 4. Mild right hydronephrosis is again identified and there is now moderate perinephric edema. Hydronephrosis now extends into the right hemipelvis, suggesting distal migration of the previously noted obstructing calculus though the calculus itself  is not well visualized on this examination. Electronically Signed   By: Fidela Salisbury M.D.   On: 05/13/2022 02:07   MR 3D Recon At Scanner  Result Date: 05/13/2022 CLINICAL DATA:  Right flank pain, cholelithiasis, nephrolithiasis EXAM: MRI ABDOMEN WITHOUT CONTRAST  (INCLUDING MRCP) TECHNIQUE: Multiplanar multisequence MR imaging of the abdomen was  performed. Heavily T2-weighted images of the biliary and pancreatic ducts were obtained, and three-dimensional MRCP images were rendered by post processing. COMPARISON:  CT 05/09/2022 FINDINGS: Lower chest: Cardiac size is mildly enlarged. Hepatobiliary: Cholelithiasis without pericholecystic inflammatory change. Choledocholithiasis again noted with 4 x 7 mm calculus noted within the distal common duct. The common duct is not dilated, measuring up to 7 mm in diameter. No intrahepatic biliary ductal dilation. Liver unremarkable. Pancreas: Complete pancreatic divisum; the dorsal duct drains to the minor papilla, best seen on image # 17, series 3. A 10 mm x 11 mm multi cystic lesion is seen within the head of the pancreas at axial image # 19, series 4 demonstrating no significant soft tissue component multiple thin internal septa. While not optimally assessed in absence of contrast administration, this is most suggestive of a serous cystadenoma. No peripancreatic inflammatory change. The pancreatic duct is not dilated. Spleen:  Within normal limits in size and appearance. Adrenals/Urinary Tract: The adrenal glands are unremarkable. The kidneys are normal in size and position. Multiple peripelvic cysts are seen on the left. On the right, mild right hydronephrosis is again identified and there is now moderate perinephric edema. Hydronephrosis now extends into the right hemipelvis, best seen on image # 13, series 3 suggesting distal migration of the previously noted obstructing calculus though the calculus itself is not well visualized on this examination. Stomach/Bowel: Visualized portions within the abdomen are unremarkable. Vascular/Lymphatic: No pathologically enlarged lymph nodes identified. No abdominal aortic aneurysm demonstrated. Other: Tiny fat containing umbilical hernia noted. Mild subcutaneous edema within the lumbar soft tissues. Musculoskeletal: There is artifact related to extensive thoracolumbar fusion  with instrumentation this limits evaluation of the osseous structures IMPRESSION: 1. Cholelithiasis and choledocholithiasis. No intrahepatic or extrahepatic biliary ductal dilation. 2. Complete pancreatic divisum. 3. Multi cystic lesion within the head of the pancreas measuring 10 x 11 mm. While not optimally assessed in absence of contrast administration, this is most suggestive of a serous cystadenoma. 4. Mild right hydronephrosis is again identified and there is now moderate perinephric edema. Hydronephrosis now extends into the right hemipelvis, suggesting distal migration of the previously noted obstructing calculus though the calculus itself is not well visualized on this examination. Electronically Signed   By: Fidela Salisbury M.D.   On: 05/13/2022 02:07     PROCEDURES:  Critical Care performed: Yes, see critical care procedure note(s)  CRITICAL CARE Performed by: Paulette Blanch   Total critical care time: 30 minutes  Critical care time was exclusive of separately billable procedures and treating other patients.  Critical care was necessary to treat or prevent imminent or life-threatening deterioration.  Critical care was time spent personally by me on the following activities: development of treatment plan with patient and/or surrogate as well as nursing, discussions with consultants, evaluation of patient's response to treatment, examination of patient, obtaining history from patient or surrogate, ordering and performing treatments and interventions, ordering and review of laboratory studies, ordering and review of radiographic studies, pulse oximetry and re-evaluation of patient's condition.   Marland Kitchen1-3 Lead EKG Interpretation  Performed by: Paulette Blanch, MD Authorized by: Paulette Blanch, MD  Interpretation: normal     ECG rate:  60   ECG rate assessment: normal     Rhythm: sinus rhythm     Ectopy: none     Conduction: normal   Comments:     Patient placed on cardiac monitor to  evaluate for arrhythmias    MEDICATIONS ORDERED IN ED: Medications  acetaminophen (TYLENOL) tablet 650 mg (has no administration in time range)    Or  acetaminophen (TYLENOL) suppository 650 mg (has no administration in time range)  ondansetron (ZOFRAN) tablet 4 mg (has no administration in time range)    Or  ondansetron (ZOFRAN) injection 4 mg (has no administration in time range)  lactated ringers infusion (has no administration in time range)  morphine (PF) 2 MG/ML injection 2 mg (has no administration in time range)  tamsulosin (FLOMAX) capsule 0.4 mg (has no administration in time range)  metroNIDAZOLE (FLAGYL) IVPB 500 mg (has no administration in time range)  ceFEPIme (MAXIPIME) 2 g in sodium chloride 0.9 % 100 mL IVPB (has no administration in time range)  ondansetron (ZOFRAN-ODT) disintegrating tablet 4 mg (4 mg Oral Given 05/12/22 2117)  sodium chloride 0.9 % bolus 1,000 mL (0 mLs Intravenous Stopped 05/13/22 0257)  ondansetron (ZOFRAN) injection 4 mg (4 mg Intravenous Given 05/13/22 0043)  fentaNYL (SUBLIMAZE) injection 25 mcg (25 mcg Intravenous Given 05/13/22 0043)  piperacillin-tazobactam (ZOSYN) IVPB 3.375 g (0 g Intravenous Stopped 05/13/22 0257)     IMPRESSION / MDM / ASSESSMENT AND PLAN / ED COURSE  I reviewed the triage vital signs and the nursing notes.                             88 year old female presenting with right-sided abdominal pain. Differential diagnosis includes, but is not limited to, ovarian cyst, ovarian torsion, acute appendicitis, diverticulitis, urinary tract infection/pyelonephritis, endometriosis, bowel obstruction, colitis, renal colic, gastroenteritis, hernia, biliary disease (biliary colic, acute cholecystitis, cholangitis, choledocholithiasis, etc), intrathoracic causes for epigastric abdominal pain including ACS, gastritis, duodenitis, pancreatitis, small bowel or large bowel obstruction, abdominal aortic aneurysm, hernia, and ulcer(s). etc. I have  personally reviewed patient's records and note her PCP office visit on 05/08/2022, multiple telephone communications on 8/17 and 05/10/2022 as well as her CT scan from 05/09/2022.  Patient's presentation is most consistent with acute presentation with potential threat to life or bodily function.  The patient is on the cardiac monitor to evaluate for evidence of arrhythmia and/or significant heart rate changes.  Laboratory results demonstrate WBC 3.5, creatinine 1.16 which is lower than previous, mild hypokalemia with potassium 3.3, negative initial troponin.  LFTs, alk phos, T. bili and lipase are all within normal limits.  Will initiate IV fluid hydration, IV fentanyl for pain paired with IV Zofran for nausea.  While her main complaint of pain is likely secondary to ureteral colic, I am concerned for choledocholithiasis given patient's CT findings.  Will consult GI for recommendations. Keep NPO in the event patient requires a procedure.  Patient is on Coumadin for atrial fibrillation; will obtain PT/INR.  Clinical Course as of 05/13/22 0524  Mon May 13, 2022  0029 Discussed case with Dr. Virgina Jock from GI who agrees that the majority of patient's pain stems from ureteral stones; however, given CT findings he does recommend proceeding with MRCP to evaluate for choledocholithiasis.  If positive, patient may be admitted to our facility for ERCP in the next 1 to 2 days.  If negative, recommends general  surgery consultation. [JS]  0126 UA noted to have moderate leukocytes with greater than 50 WBC.  Will administer IV antibiotic. [JS]  0216 MRCP demonstrates cholelithiasis and choledocholithiasis.  Will consult hospitalist services for evaluation and admission. [JS]    Clinical Course User Index [JS] Paulette Blanch, MD     FINAL CLINICAL IMPRESSION(S) / ED DIAGNOSES   Final diagnoses:  Generalized abdominal pain  Ureteral colic  Choledocholithiasis  Lower urinary tract infectious disease     Rx / DC  Orders   ED Discharge Orders     None        Note:  This document was prepared using Dragon voice recognition software and may include unintentional dictation errors.   Paulette Blanch, MD 05/13/22 [phone]

## 2022-05-13 NOTE — Progress Notes (Signed)
Patient voided without difficulty, clear pink-tinged urine. Strained, with no stones or particles noted.

## 2022-05-14 ENCOUNTER — Encounter
Admission: EM | Disposition: A | Discharge status: Home Health | Payer: Self-pay | Source: Home / Self Care | Attending: Internal Medicine

## 2022-05-14 ENCOUNTER — Inpatient Hospital Stay: Payer: PPO | Admitting: Certified Registered"

## 2022-05-14 ENCOUNTER — Encounter: Payer: Self-pay | Admitting: Internal Medicine

## 2022-05-14 ENCOUNTER — Other Ambulatory Visit: Payer: Self-pay

## 2022-05-14 ENCOUNTER — Inpatient Hospital Stay: Payer: PPO

## 2022-05-14 DIAGNOSIS — Z792 Long term (current) use of antibiotics: Secondary | ICD-10-CM | POA: Diagnosis not present

## 2022-05-14 DIAGNOSIS — K8050 Calculus of bile duct without cholangitis or cholecystitis without obstruction: Secondary | ICD-10-CM | POA: Diagnosis not present

## 2022-05-14 DIAGNOSIS — N132 Hydronephrosis with renal and ureteral calculous obstruction: Secondary | ICD-10-CM | POA: Diagnosis not present

## 2022-05-14 DIAGNOSIS — N1831 Chronic kidney disease, stage 3a: Secondary | ICD-10-CM

## 2022-05-14 HISTORY — PX: ERCP: SHX5425

## 2022-05-14 LAB — COMPREHENSIVE METABOLIC PANEL
ALT: 11 U/L (ref 0–44)
AST: 23 U/L (ref 15–41)
Albumin: 3.1 g/dL — ABNORMAL LOW (ref 3.5–5.0)
Alkaline Phosphatase: 41 U/L (ref 38–126)
Anion gap: 5 (ref 5–15)
BUN: 10 mg/dL (ref 8–23)
CO2: 26 mmol/L (ref 22–32)
Calcium: 8.4 mg/dL — ABNORMAL LOW (ref 8.9–10.3)
Chloride: 111 mmol/L (ref 98–111)
Creatinine, Ser: 1.15 mg/dL — ABNORMAL HIGH (ref 0.44–1.00)
GFR, Estimated: 46 mL/min — ABNORMAL LOW (ref >60)
Glucose, Bld: 87 mg/dL (ref 70–99)
Potassium: 3.4 mmol/L — ABNORMAL LOW (ref 3.5–5.1)
Sodium: 142 mmol/L (ref 135–145)
Total Bilirubin: 0.9 mg/dL (ref 0.3–1.2)
Total Protein: 5.5 g/dL — ABNORMAL LOW (ref 6.5–8.1)

## 2022-05-14 LAB — CBC
HCT: 34.2 % — ABNORMAL LOW (ref 36.0–46.0)
Hemoglobin: 11 g/dL — ABNORMAL LOW (ref 12.0–15.0)
MCH: 29.8 pg (ref 26.0–34.0)
MCHC: 32.2 g/dL (ref 30.0–36.0)
MCV: 92.7 fL (ref 80.0–100.0)
Platelets: 149 10*3/uL — ABNORMAL LOW (ref 150–400)
RBC: 3.69 MIL/uL — ABNORMAL LOW (ref 3.87–5.11)
RDW: 13.2 % (ref 11.5–15.5)
WBC: 3.3 10*3/uL — ABNORMAL LOW (ref 4.0–10.5)
nRBC: 0 % (ref 0.0–0.2)

## 2022-05-14 LAB — PROTIME-INR
INR: 1.5 — ABNORMAL HIGH (ref 0.8–1.2)
Prothrombin Time: 17.7 seconds — ABNORMAL HIGH (ref 11.4–15.2)

## 2022-05-14 SURGERY — ERCP, WITH INTERVENTION IF INDICATED
Anesthesia: General

## 2022-05-14 MED ORDER — SUCCINYLCHOLINE CHLORIDE 200 MG/10ML IV SOSY
PREFILLED_SYRINGE | INTRAVENOUS | Status: AC
  Filled 2022-05-14: qty 10

## 2022-05-14 MED ORDER — DEXAMETHASONE SODIUM PHOSPHATE 10 MG/ML IJ SOLN
INTRAMUSCULAR | Status: DC | PRN
  Administered 2022-05-14: 5 mg via INTRAVENOUS

## 2022-05-14 MED ORDER — PROCHLORPERAZINE EDISYLATE 10 MG/2ML IJ SOLN
5.0000 mg | INTRAMUSCULAR | Status: DC | PRN
  Administered 2022-05-14: 5 mg via INTRAVENOUS
  Filled 2022-05-14 (×2): qty 1

## 2022-05-14 MED ORDER — DEXAMETHASONE SODIUM PHOSPHATE 10 MG/ML IJ SOLN
INTRAMUSCULAR | Status: AC
  Filled 2022-05-14: qty 1

## 2022-05-14 MED ORDER — VENLAFAXINE HCL ER 37.5 MG PO CP24
37.5000 mg | ORAL_CAPSULE | Freq: Every day | ORAL | Status: DC
  Administered 2022-05-14 – 2022-05-17 (×4): 37.5 mg via ORAL
  Filled 2022-05-14 (×4): qty 1

## 2022-05-14 MED ORDER — EPHEDRINE SULFATE (PRESSORS) 50 MG/ML IJ SOLN
INTRAMUSCULAR | Status: DC | PRN
  Administered 2022-05-14: 10 mg via INTRAVENOUS

## 2022-05-14 MED ORDER — DICLOFENAC SUPPOSITORY 100 MG
100.0000 mg | Freq: Once | RECTAL | Status: AC
  Administered 2022-05-14: 100 mg via RECTAL

## 2022-05-14 MED ORDER — LIDOCAINE HCL (CARDIAC) PF 100 MG/5ML IV SOSY
PREFILLED_SYRINGE | INTRAVENOUS | Status: DC | PRN
  Administered 2022-05-14: 60 mg via INTRAVENOUS

## 2022-05-14 MED ORDER — FENTANYL CITRATE (PF) 100 MCG/2ML IJ SOLN
INTRAMUSCULAR | Status: AC
  Filled 2022-05-14: qty 2

## 2022-05-14 MED ORDER — AMIODARONE HCL 200 MG PO TABS
300.0000 mg | ORAL_TABLET | Freq: Every day | ORAL | Status: DC
  Administered 2022-05-14 – 2022-05-17 (×4): 300 mg via ORAL
  Filled 2022-05-14 (×4): qty 2

## 2022-05-14 MED ORDER — ONDANSETRON HCL 4 MG/2ML IJ SOLN: 4.0000 mg | Freq: Once | INTRAMUSCULAR | Status: DC | PRN

## 2022-05-14 MED ORDER — BISACODYL 5 MG PO TBEC: 10.0000 mg | DELAYED_RELEASE_TABLET | Freq: Every day | ORAL | Status: DC | PRN

## 2022-05-14 MED ORDER — ASPIRIN 81 MG PO CHEW
81.0000 mg | CHEWABLE_TABLET | Freq: Every day | ORAL | Status: DC
  Administered 2022-05-14: 81 mg via ORAL
  Filled 2022-05-14: qty 1

## 2022-05-14 MED ORDER — SUCCINYLCHOLINE CHLORIDE 200 MG/10ML IV SOSY
PREFILLED_SYRINGE | INTRAVENOUS | Status: DC | PRN
  Administered 2022-05-14: 80 mg via INTRAVENOUS

## 2022-05-14 MED ORDER — PROPOFOL 10 MG/ML IV BOLUS
INTRAVENOUS | Status: DC | PRN
  Administered 2022-05-14: 120 mg via INTRAVENOUS

## 2022-05-14 MED ORDER — DICLOFENAC SUPPOSITORY 100 MG
RECTAL | Status: AC
  Filled 2022-05-14: qty 1

## 2022-05-14 MED ORDER — ONDANSETRON HCL 4 MG/2ML IJ SOLN
INTRAMUSCULAR | Status: AC
  Filled 2022-05-14: qty 2

## 2022-05-14 MED ORDER — LEVOTHYROXINE SODIUM 50 MCG PO TABS
50.0000 ug | ORAL_TABLET | Freq: Every morning | ORAL | Status: DC
  Administered 2022-05-15 – 2022-05-17 (×3): 50 ug via ORAL
  Filled 2022-05-14 (×3): qty 1

## 2022-05-14 MED ORDER — ACETAMINOPHEN 325 MG PO TABS: 650.0000 mg | ORAL_TABLET | Freq: Once | ORAL | Status: DC | PRN

## 2022-05-14 MED ORDER — FENTANYL CITRATE (PF) 100 MCG/2ML IJ SOLN
INTRAMUSCULAR | Status: DC | PRN
Start: 2022-05-14 — End: 2022-05-14
  Administered 2022-05-14: 50 ug via INTRAVENOUS

## 2022-05-14 MED ORDER — ALPRAZOLAM 0.25 MG PO TABS
0.2500 mg | ORAL_TABLET | Freq: Every day | ORAL | Status: DC
  Administered 2022-05-14 – 2022-05-16 (×3): 0.25 mg via ORAL
  Filled 2022-05-14 (×3): qty 1

## 2022-05-14 MED ORDER — POTASSIUM CHLORIDE 10 MEQ/100ML IV SOLN: 10.0000 meq | INTRAVENOUS | Status: DC

## 2022-05-14 MED ORDER — LACTATED RINGERS IV SOLN: INTRAVENOUS | Status: DC

## 2022-05-14 MED ORDER — EPHEDRINE 5 MG/ML INJ
INTRAVENOUS | Status: AC
  Filled 2022-05-14: qty 5

## 2022-05-14 MED ORDER — ACETAMINOPHEN 160 MG/5ML PO SOLN: 325.0000 mg | ORAL | Status: DC | PRN

## 2022-05-14 NOTE — Plan of Care (Signed)

## 2022-05-14 NOTE — Progress Notes (Signed)
Brief GI Note  NAEON. Pt still with some abdominal discomfort. LFTs remain normal. On abx ERCP planned for today INR 1.5. Other labs reviewed Continue to hold coumadin Recommend general surgery consultation for consideration of cholecystectomy  Further recommendations pending endoscopy. Please see op report for further details.  Ronne Binning, [facility]

## 2022-05-14 NOTE — Op Note (Signed)
[facility] Gastroenterology Patient Name: Kelly Bishop Procedure Date: 05/14/2022 10:32 AM MRN: [id number] Account #: [id number] Date of Birth: 20-Jun-1932 Admit Type: Inpatient Age: 88 Room: [facility] ENDO ROOM 4 Gender: Female Note Status: Finalized Instrument Name: [id number] Procedure:             ERCP Indications:           Common bile duct stone(s) Providers:             Lucilla Lame MD, MD Referring MD:          [name], MD (Referring MD) Medicines:             General Anesthesia Complications:         No immediate complications. Procedure:             Pre-Anesthesia Assessment:                        - Prior to the procedure, a History and Physical was                         performed, and patient medications and allergies were                         reviewed. The patient's tolerance of previous                         anesthesia was also reviewed. The risks and benefits                         of the procedure and the sedation options and risks                         were discussed with the patient. All questions were                         answered, and informed consent was obtained. Prior                         Anticoagulants: The patient has taken Coumadin                         (warfarin), last dose was 4 days prior to procedure.                         ASA Grade Assessment: II - A patient with mild                         systemic disease. After reviewing the risks and                         benefits, the patient was deemed in satisfactory                         condition to undergo the procedure.                        After obtaining informed consent, the scope was passed  under direct vision. Throughout the procedure, the                         patient's blood pressure, pulse, and oxygen                         saturations were monitored continuously. The                         Duodenoscope was introduced  through the mouth, and                         used to inject contrast into and used to cannulate the                         bile duct. The ERCP was accomplished without                         difficulty. The patient tolerated the procedure well. Findings:      The scout film was normal. The esophagus was successfully intubated       under direct vision. The scope was advanced to a normal major papilla in       the descending duodenum without detailed examination of the pharynx,       larynx and associated structures, and upper GI tract. The upper GI tract       was grossly normal. The bile duct was deeply cannulated with the       short-nosed traction sphincterotome. Contrast was injected. I personally       interpreted the bile duct images. There was brisk flow of contrast       through the ducts. Image quality was excellent. Contrast extended to the       entire biliary tree. The lower third of the main bile duct contained one       stone. A wire was passed into the biliary tree. A 6 mm biliary       sphincterotomy was made with a traction (standard) sphincterotome using       ERBE electrocautery. The sphincterotomy oozed blood. The biliary tree       was swept with a 15 mm balloon starting at the bifurcation. One stone       was removed. No stones remained. Impression:            - Choledocholithiasis was found. Complete removal was                         accomplished by biliary sphincterotomy and balloon                         extraction.                        - A biliary sphincterotomy was performed.                        - The biliary tree was swept. Recommendation:        - Return patient to hospital ward for ongoing care.                        - Clear liquid  diet today.                        - Watch for pancreatitis, bleeding, perforation, and                         cholangitis. Procedure Code(s):     --- Professional ---                        [phone], Endoscopic  retrograde cholangiopancreatography                         (ERCP); with removal of calculi/debris from                         biliary/pancreatic duct(s)                        43262, Endoscopic retrograde cholangiopancreatography                         (ERCP); with sphincterotomy/papillotomy                        [phone], Endoscopic catheterization of the biliary                         ductal system, radiological supervision and                         interpretation Diagnosis Code(s):     --- Professional ---                        K80.50, Calculus of bile duct without cholangitis or                         cholecystitis without obstruction CPT copyright 2019 American Medical Association. All rights reserved. The codes documented in this report are preliminary and upon coder review may  be revised to meet current compliance requirements. Lucilla Lame MD, MD 05/14/2022 11:37:07 AM This report has been signed electronically. Number of Addenda: 0 Note Initiated On: 05/14/2022 10:32 AM Estimated Blood Loss:  Estimated blood loss: none.      [facility]

## 2022-05-14 NOTE — Anesthesia Procedure Notes (Signed)
Procedure Name: Intubation Date/Time: 05/14/2022 10:48 AM  Performed by: Esaw Grandchild, CRNAPre-anesthesia Checklist: Patient identified, Emergency Drugs available, Suction available and Patient being monitored Patient Re-evaluated:Patient Re-evaluated prior to induction Oxygen Delivery Method: Circle system utilized Preoxygenation: Pre-oxygenation with 100% oxygen Induction Type: IV induction and Rapid sequence Ventilation: Mask ventilation without difficulty Laryngoscope Size: McGraph and 3 Grade View: Grade I Tube type: Oral Tube size: 7.0 mm Number of attempts: 1 Airway Equipment and Method: Stylet, Oral airway and Bite block Placement Confirmation: ETT inserted through vocal cords under direct vision, positive ETCO2 and breath sounds checked- equal and bilateral Secured at: 20 cm Tube secured with: Tape Dental Injury: Teeth and Oropharynx as per pre-operative assessment

## 2022-05-14 NOTE — Anesthesia Postprocedure Evaluation (Signed)
Anesthesia Post Note  Patient: Kelly Bishop  Procedure(s) Performed: ENDOSCOPIC RETROGRADE CHOLANGIOPANCREATOGRAPHY (ERCP)  Patient location during evaluation: PACU Anesthesia Type: General Level of consciousness: awake and alert, oriented and patient cooperative Pain management: pain level controlled Vital Signs Assessment: post-procedure vital signs reviewed and stable Respiratory status: spontaneous breathing, nonlabored ventilation and respiratory function stable Cardiovascular status: blood pressure returned to baseline and stable Postop Assessment: adequate PO intake Anesthetic complications: no   No notable events documented.   Last Vitals:  Vitals:   05/14/22 1157 05/14/22 1206  BP: (!) 145/75 (!) 160/65  Pulse: 75 74  Resp: (!) 24 18  Temp:    SpO2: 95% 96%    Last Pain:  Vitals:   05/14/22 1143  TempSrc: Temporal  PainSc:                  Darrin Nipper

## 2022-05-14 NOTE — Progress Notes (Signed)
Subjective:   CC: Choledocholithiasis  HPI:  Kelly Bishop is a 88 y.o. female who is consulted by Laurann Montana for evaluation of above cc.  Admitted for above status post ERCP. Surgery consulted for interval cholecystectomy to prevent recurrence.  Patient currently is in no pain but does complain of nausea.    Past Medical History:  has a past medical history of Actinic keratosis (05/17/2008), Actinic keratosis (10/06/2014), Actinic keratosis (11/23/2020), Anemia, Atrial fibrillation ([place]), B12 deficiency, Basal cell carcinoma (08/08/2009), Basal cell carcinoma (08/08/2009), Basal cell carcinoma (07/31/2010), Basal cell carcinoma (10/06/2014), Basal cell carcinoma (10/11/2015), Basal cell carcinoma (02/19/2017), Basal cell carcinoma (04/23/2017), Basal cell carcinoma (01/25/2020), Breast cancer ([place]) (2015), Breast cancer, right breast ([place]), Cataracts, bilateral, COPD (chronic obstructive pulmonary disease) ([place]), Hemorrhoids, Lymphopenia, Osteoarthritis, Osteoporosis (July 2015), Pancytopenia [facility]), Personal history of radiation therapy, Skin cancer, Spastic dysphonia, Squamous cell carcinoma of skin (06/22/2007), Squamous cell carcinoma of skin (05/17/2008), Squamous cell carcinoma of skin (08/08/2009), Squamous cell carcinoma of skin (10/06/2014), Squamous cell carcinoma of skin (12/08/2017), Squamous cell carcinoma of skin (07/26/2019), Thrombocytopenia ([place]), Unsteady gait, and Zenker's diverticulum.  Past Surgical History:  has a past surgical history that includes L1-L4 laminectomy and T6 fusion (May 2011); ultrasound guided biopsy of breast (Jan 27, 2014); Replacement total knee (Bilateral); Rotator cuff repair (Bilateral); Breast lumpectomy with needle localization (Left, 03/12/2016); Hysteroscopy with D & C (N/A, 07/05/2016); Breast lumpectomy (Right, 2015); Breast excisional biopsy (Left); and Breast excisional biopsy (Left, 01/24/2016).  Family History: family history includes Breast cancer in  her brother; Leukemia in her brother; Lung cancer in her father; Prostate cancer in her brother; Throat cancer in her mother.  Social History:  reports that she has never smoked. She has never used smokeless tobacco. She reports that she does not drink alcohol and does not use drugs.  Current Medications:  Prior to Admission medications   Medication Sig Start Date End Date Taking? Authorizing Provider  ALPRAZolam Duanne Moron) 0.5 MG tablet Take 0.25 mg by mouth at bedtime.   Yes [provider]  amiodarone (PACERONE) 200 MG tablet Take 300 mg by mouth daily. 10/02/16 05/13/22 Yes [provider]  aspirin 81 MG chewable tablet Chew 81 mg by mouth daily.    Yes [provider]  bisacodyl (DULCOLAX) 5 MG EC tablet Take 10 mg by mouth at bedtime.    Yes [provider]  Calcium Carbonate-Vit D-Min (CALCIUM 600+D PLUS MINERALS) 600-400 MG-UNIT CHEW Chew 1 Dose by mouth daily.    Yes [provider]  cefUROXime (CEFTIN) 250 MG tablet Take 250 mg by mouth 2 (two) times daily. 05/07/22  Yes [provider]  Cholecalciferol 2000 units CAPS Take 1 capsule by mouth daily.   Yes [provider]  ciprofloxacin (CIPRO) 500 MG tablet Take 500 mg by mouth 2 (two) times daily.    Yes [provider]  Cyanocobalamin 1000 MCG TBCR Take 1,000 mcg by mouth daily.    Yes [provider]  levothyroxine (SYNTHROID) 50 MCG tablet Take 50 mcg by mouth every morning. 03/09/22  Yes [provider]  metroNIDAZOLE (FLAGYL) 250 MG tablet Take 250 mg by mouth 3 (three) times daily. 05/08/22  Yes [provider]  warfarin (COUMADIN) 5 MG tablet Take 5 mg by mouth daily at 6 PM. Sunday/Tuesday/Thursday/Saturday 05/05/15 05/13/22 Yes [provider]  warfarin (COUMADIN) 6 MG tablet Take 7 mg by mouth daily at 6 PM. Monday.Wednesday/friday 05/11/15 05/13/22 Yes [provider]  amoxicillin (AMOXIL) 500 MG  capsule 1,000 mg. Prior  to dental procedures Patient not taking: Reported on 05/13/2022 04/04/17   [provider]  anastrozole (ARIMIDEX) 1 MG tablet Take 1 tablet (1 mg total) by mouth daily. Patient not taking: Reported on 10/21/2016 07/22/16   Cammie Sickle, MD  denosumab (PROLIA) 60 MG/ML SOLN injection Inject into the skin. Patient not taking: Reported on 05/13/2022    [provider]  docusate sodium (COLACE) 100 MG capsule Take 1 capsule (100 mg total) by mouth daily as needed for mild constipation. Patient not taking: Reported on 04/28/2018 07/05/16   [name], MD  fexofenadine (ALLEGRA) 180 MG tablet Take 180 mg by mouth daily.    [provider]  furosemide (LASIX) 40 MG tablet Take 40 mg by mouth daily as needed.     [provider]  mometasone (ELOCON) 0.1 % cream Apply BID to affected areas as directed Patient not taking: Reported on 05/13/2022 11/23/20   Ralene Bathe, MD  mupirocin ointment (BACTROBAN) 2 % Place 1 application into the nose daily. Qd t excision site Patient not taking: Reported on 05/13/2022 01/25/20   Ralene Bathe, MD  ondansetron (ZOFRAN ODT) 4 MG disintegrating tablet Take 1 tablet (4 mg total) by mouth every 8 (eight) hours as needed for nausea or vomiting. Patient not taking: Reported on 07/22/2016 07/05/16   [name], MD  potassium chloride (K-DUR) 10 MEQ tablet Take 10 mEq by mouth daily as needed. Patient not taking: Reported on 05/13/2022    [provider]  venlafaxine XR (EFFEXOR-XR) 37.5 MG 24 hr capsule Take 37.5 mg by mouth daily. 04/21/22   [provider]    Allergies:  Allergies as of 05/12/2022 - Review Complete 05/12/2022  Allergen Reaction Noted   Ace inhibitors Cough 07/11/2015   Beta adrenergic blockers Other (See Comments) 07/11/2015   Morphine and related Nausea And Vomiting 07/11/2015   Peanut-containing drug products Hives 07/11/2015   Codeine Nausea Only and Rash 07/11/2015     ROS:  General: Denies weight loss, weight gain, fatigue, fevers, chills, and night sweats. Eyes: Denies blurry vision, double vision, eye pain, itchy eyes, and tearing. Ears: Denies hearing loss, earache, and ringing in ears. Nose: Denies sinus pain, congestion, infections, runny nose, and nosebleeds. Mouth/throat: Denies hoarseness, sore throat, bleeding gums, and difficulty swallowing. Heart: Denies chest pain, palpitations, racing heart, irregular heartbeat, leg pain or swelling, and decreased activity tolerance. Respiratory: Denies breathing difficulty, shortness of breath, wheezing, cough, and sputum. GI: Denies change in appetite, heartburn, constipation, diarrhea, and blood in stool. GU: Denies difficulty urinating, pain with urinating, urgency, frequency, blood in urine. Musculoskeletal: Denies joint stiffness, pain, swelling, muscle weakness. Skin: Denies rash, itching, mass, tumors, sores, and boils Neurologic: Denies headache, fainting, dizziness, seizures, numbness, and tingling. Psychiatric: Denies depression, anxiety, difficulty sleeping, and memory loss. Endocrine: Denies heat or cold intolerance, and increased thirst or urination. Blood/lymph: Denies easy bruising, and swollen glands     Objective:     BP (!) 153/62 (BP Location: Left Arm)   Pulse 68   Temp 98.1 F (36.7 C)   Resp 20   Ht '5\' 3"'$  (1.6 m)   Wt 64.9 kg   SpO2 93%   BMI 25.33 kg/m    Constitutional :  alert, cooperative, appears stated age, and no distress  Lymphatics/Throat:  no asymmetry, masses, or scars  Respiratory:  clear to auscultation bilaterally  Cardiovascular:  regular rate and rhythm  Gastrointestinal: soft, non-tender; bowel sounds normal;  no masses,  no organomegaly.   Musculoskeletal: Steady movement  Skin: Cool and moist  Psychiatric: Normal affect, non-agitated, not confused       LABS:     Latest Ref Rng & Units 05/14/2022    5:50 AM 05/13/2022    5:00 PM 05/12/2022     9:19 PM  CMP  Glucose 70 - 99 mg/dL 87  100  90   BUN 8 - 23 mg/dL '10  12  18   '$ Creatinine 0.44 - 1.00 mg/dL 1.15  1.06  1.16   Sodium 135 - 145 mmol/L 142  138  141   Potassium 3.5 - 5.1 mmol/L 3.4  3.7  3.3   Chloride 98 - 111 mmol/L 111  106  109   CO2 22 - 32 mmol/L '26  26  24   '$ Calcium 8.9 - 10.3 mg/dL 8.4  8.3  9.0   Total Protein 6.5 - 8.1 g/dL 5.5   6.7   Total Bilirubin 0.3 - 1.2 mg/dL 0.9   0.5   Alkaline Phos 38 - 126 U/L 41   56   AST 15 - 41 U/L 23   25   ALT 0 - 44 U/L 11   13       Latest Ref Rng & Units 05/14/2022    5:50 AM 05/12/2022    9:19 PM 01/05/2018   12:51 PM  CBC  WBC 4.0 - 10.5 K/uL 3.3  3.5  3.9   Hemoglobin 12.0 - 15.0 g/dL 11.0  13.1  13.6   Hematocrit 36.0 - 46.0 % 34.2  40.5  39.8   Platelets 150 - 400 K/uL 149  196  187      RADS: N/a Assessment:      Choledocholithiasis status post ERCP  Plan:      Discussed the risk of surgery including post-op infxn, seroma, biloma, chronic pain, poor-delayed wound healing, retained gallstone, conversion to open procedure, post-op SBO or ileus, and need for additional procedures to address said risks.  The risks of general anesthetic including MI, CVA, sudden death or even reaction to anesthetic medications also discussed. Alternatives include continued observation.  Benefits include possible symptom relief, prevention of complications including acute cholecystitis, pancreatitis.  Typical post operative recovery of 3-5 days rest, continued pain in area and incision sites, possible loose stools up to 4-6 weeks, also discussed.  The patient understands the risks, any and all questions were answered to the patient's satisfaction.  Patient is a agreeable to proceed with robotic assisted lap chole.  We will schedule in the next day or 2 as time permits.  Continue to stay off Coumadin until then.  Further care per hospitalist and GI team until surgery.  labs/images/medications/previous chart entries reviewed  personally and relevant changes/updates noted above.

## 2022-05-14 NOTE — Progress Notes (Signed)
[facility] Progress Note  Patient: Kelly Bishop    ELF:[id number]  DOA: 05/13/2022    Date of Service: the patient was seen and examined on 05/14/2022  Brief hospital course: 88 year old female with past medical history of stage IIIa chronic kidney disease paroxysmal atrial fibrillation, chronic spine infection on daily ofloxacin and hypothyroidism who presented to the emergency room on the night of 8/20 with worsening right lower quadrant pain and intermittent rectal bleeding.  Symptoms have been going on for the past 5 to 6 days and patient had seen her PCP on 8/16.  CT scan done at that time noted right hydroureteronephrosis with a 3 mm mid to distal obstructing stone.  An MRI of the abdomen and pelvis done in the emergency department noted same mild right hydronephrosis, but now with moderate perinephric edema and hydronephrosis extending into the right hemipelvis suggesting distal migration of previously seen stone.  Patient was admitted to the hospitalist service and put on IV fluids and antibiotics plus pain control.  Urology consulted and felt no intervention needed and with fluids, stone should pass on its own.    GI consulted and patient underwent ERCP with stone removal on 05/14/22.     Assessment and Plan: Assessment and Plan: Hydronephrosis with obstructing calculus, right Secondary to transient obstructing stone.  Seen by urology.  Started on cefepime plus Flomax and IV fluids.  Urology feels stone should pass on its own.   Acute pyelonephritis Secondary to obstructing stone.   Urine and blood cultures pending.  Continue cefepime.    Calculus of bile duct without cholecystitis and without obstruction Underwent ERCP on 8/22 by Dr. Allen Norris. Hold warfarin until 3 days post-procedure. General surgery consult for consideration of cholecystectomy. Clear liquids today, advancement per GI.  Cholelithiasis with choledocholithiasis As outlined  Cystic mass of pancreas Appears  to be incidental finding.   Suspected cystadenoma per radiology.  Further evaluation per GI  Atrial fibrillation ([name]) Continue amiodarone Hold warfarin until 3 days post-ERCP per GI   Benign essential hypertension Not on home antihypertensives other than Lasix daily PRN.   As needed hydralazine.  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  PAD (peripheral artery disease) ([name]) Resume aspirin  COPD (chronic obstructive pulmonary disease) ([name]) DuoNebs as needed  Stage 3a chronic kidney disease ([place]) Renal function at baseline.   Monitor BMP.       Body mass index is 25.33 kg/m.        Consultants: Urology Gastroenterology  Procedures: ERCP 8/22  Antimicrobials: Zosyn 8/20 IV cefepime 8/21 >> IV Flagul 8/21 >>   Code Status: DNR   Subjective: Patient seen after ERCP today.  Reports some mild nausea and a headache she attributes her sinus issues.  Hopes to go home tomorrow.  No other acute complaints.  Objective: Noted elevated blood pressures Vitals:   05/14/22 1206 05/14/22 1628  BP: (!) 160/65 (!) 153/75  Pulse: 74 77  Resp: 18   Temp:  97.6 F (36.4 C)  SpO2: 96% 96%     Filed Weights   05/12/22 2112  Weight: 64.9 kg   Body mass index is 25.33 kg/m.  Exam:  General: Alert and oriented x3, no acute distress HEENT: Cephalic, atraumatic, mucous membranes slightly dry Cardiovascular: Regular rate and rhythm, S1-S2 Respiratory: Clear to auscultation bilaterally, moderate inspiratory effort Abdomen: Soft, nontender, nondistended, hypoactive bowel sounds Musculoskeletal: No clubbing or cyanosis, trace  pitting edema Skin: Skin breaks, tears or lesions Psychiatry: [employer], no evidence of psychoses Neurology: No focal deficits   Data Reviewed: Basic metabolic panel from 4/62 pending INR 1.4   Disposition:  Status is: Inpatient Remains inpatient  appropriate because:  -closely monitor s/p ERCP today -resume anticoagulation   Anticipated discharge date: 8/23  Family Communication: Sister at the bedside   DVT Prophylaxis: SCDs Start: 05/13/22 0329    Author: Ezekiel Slocumb , DO 05/14/2022 4:41 PM  To reach On-call, see care teams to locate the attending and reach out via [email or web address]. Between 7PM-7AM, please contact night-coverage If you still have difficulty reaching the attending provider, please page the [facility] (Director on Call) for [facility] on amion for assistance.

## 2022-05-14 NOTE — Assessment & Plan Note (Addendum)
Underwent ERCP on 8/22 by Dr. Allen Norris. Hold warfarin until 3 days post-procedure. General surgery consulted. Cholecystectomy done on 8/24. Resumed and tolerating diet post-operatively Treated with IV Cefepime, Flagyl.

## 2022-05-14 NOTE — Progress Notes (Signed)
Mobility Specialist - Progress Note    05/14/22 0800  Mobility  Activity Dangled on edge of bed;Turned to right side  Level of Assistance Independent  Assistive Device None  Distance Ambulated (ft) 0 ft  Activity Response Tolerated well  $Mobility charge 1 Mobility    Pt supine in bed on RA upon arrival. Pt scoots to EOB and performs pericare indep. Pt returns to bed with needs in reach and bed alarm set.  Gretchen Short  Mobility Specialist  05/14/22 8:59 AM

## 2022-05-14 NOTE — Transfer of Care (Signed)
Immediate Anesthesia Transfer of Care Note  Patient: Kelly Bishop  Procedure(s) Performed: ENDOSCOPIC RETROGRADE CHOLANGIOPANCREATOGRAPHY (ERCP)  Patient Location: Endoscopy Unit  Anesthesia Type:General  Level of Consciousness: awake, alert  and oriented  Airway & Oxygen Therapy: Patient Spontanous Breathing and Patient connected to face mask oxygen  Post-op Assessment: Report given to RN, Post -op Vital signs reviewed and stable and Patient moving all extremities  Post vital signs: Reviewed and stable  Last Vitals:  Vitals Value Taken Time  BP 153/70 05/14/22 1143  Temp    Pulse 78 05/14/22 1144  Resp 17 05/14/22 1144  SpO2 100 % 05/14/22 1144  Vitals shown include unvalidated device data.  Last Pain:  Vitals:   05/14/22 0925  TempSrc:   PainSc: 0-No pain         Complications: No notable events documented.

## 2022-05-14 NOTE — H&P (View-Only) (Signed)
Subjective:   CC: Choledocholithiasis  HPI:  Kelly Bishop is a 88 y.o. female who is consulted by Laurann Montana for evaluation of above cc.  Admitted for above status post ERCP. Surgery consulted for interval cholecystectomy to prevent recurrence.  Patient currently is in no pain but does complain of nausea.    Past Medical History:  has a past medical history of Actinic keratosis (05/17/2008), Actinic keratosis (10/06/2014), Actinic keratosis (11/23/2020), Anemia, Atrial fibrillation ([place]), B12 deficiency, Basal cell carcinoma (08/08/2009), Basal cell carcinoma (08/08/2009), Basal cell carcinoma (07/31/2010), Basal cell carcinoma (10/06/2014), Basal cell carcinoma (10/11/2015), Basal cell carcinoma (02/19/2017), Basal cell carcinoma (04/23/2017), Basal cell carcinoma (01/25/2020), Breast cancer ([place]) (2015), Breast cancer, right breast ([place]), Cataracts, bilateral, COPD (chronic obstructive pulmonary disease) ([place]), Hemorrhoids, Lymphopenia, Osteoarthritis, Osteoporosis (July 2015), Pancytopenia [facility]), Personal history of radiation therapy, Skin cancer, Spastic dysphonia, Squamous cell carcinoma of skin (06/22/2007), Squamous cell carcinoma of skin (05/17/2008), Squamous cell carcinoma of skin (08/08/2009), Squamous cell carcinoma of skin (10/06/2014), Squamous cell carcinoma of skin (12/08/2017), Squamous cell carcinoma of skin (07/26/2019), Thrombocytopenia ([place]), Unsteady gait, and Zenker's diverticulum.  Past Surgical History:  has a past surgical history that includes L1-L4 laminectomy and T6 fusion (May 2011); ultrasound guided biopsy of breast (Jan 27, 2014); Replacement total knee (Bilateral); Rotator cuff repair (Bilateral); Breast lumpectomy with needle localization (Left, 03/12/2016); Hysteroscopy with D & C (N/A, 07/05/2016); Breast lumpectomy (Right, 2015); Breast excisional biopsy (Left); and Breast excisional biopsy (Left, 01/24/2016).  Family History: family history includes Breast cancer in  her brother; Leukemia in her brother; Lung cancer in her father; Prostate cancer in her brother; Throat cancer in her mother.  Social History:  reports that she has never smoked. She has never used smokeless tobacco. She reports that she does not drink alcohol and does not use drugs.  Current Medications:  Prior to Admission medications   Medication Sig Start Date End Date Taking? Authorizing Provider  ALPRAZolam Duanne Moron) 0.5 MG tablet Take 0.25 mg by mouth at bedtime.   Yes [provider]  amiodarone (PACERONE) 200 MG tablet Take 300 mg by mouth daily. 10/02/16 05/13/22 Yes [provider]  aspirin 81 MG chewable tablet Chew 81 mg by mouth daily.    Yes [provider]  bisacodyl (DULCOLAX) 5 MG EC tablet Take 10 mg by mouth at bedtime.    Yes [provider]  Calcium Carbonate-Vit D-Min (CALCIUM 600+D PLUS MINERALS) 600-400 MG-UNIT CHEW Chew 1 Dose by mouth daily.    Yes [provider]  cefUROXime (CEFTIN) 250 MG tablet Take 250 mg by mouth 2 (two) times daily. 05/07/22  Yes [provider]  Cholecalciferol 2000 units CAPS Take 1 capsule by mouth daily.   Yes [provider]  ciprofloxacin (CIPRO) 500 MG tablet Take 500 mg by mouth 2 (two) times daily.    Yes [provider]  Cyanocobalamin 1000 MCG TBCR Take 1,000 mcg by mouth daily.    Yes [provider]  levothyroxine (SYNTHROID) 50 MCG tablet Take 50 mcg by mouth every morning. 03/09/22  Yes [provider]  metroNIDAZOLE (FLAGYL) 250 MG tablet Take 250 mg by mouth 3 (three) times daily. 05/08/22  Yes [provider]  warfarin (COUMADIN) 5 MG tablet Take 5 mg by mouth daily at 6 PM. Sunday/Tuesday/Thursday/Saturday 05/05/15 05/13/22 Yes [provider]  warfarin (COUMADIN) 6 MG tablet Take 7 mg by mouth daily at 6 PM. Monday.Wednesday/friday 05/11/15 05/13/22 Yes [provider]  amoxicillin (AMOXIL) 500 MG  capsule 1,000 mg. Prior  to dental procedures Patient not taking: Reported on 05/13/2022 04/04/17   [provider]  anastrozole (ARIMIDEX) 1 MG tablet Take 1 tablet (1 mg total) by mouth daily. Patient not taking: Reported on 10/21/2016 07/22/16   Cammie Sickle, MD  denosumab (PROLIA) 60 MG/ML SOLN injection Inject into the skin. Patient not taking: Reported on 05/13/2022    [provider]  docusate sodium (COLACE) 100 MG capsule Take 1 capsule (100 mg total) by mouth daily as needed for mild constipation. Patient not taking: Reported on 04/28/2018 07/05/16   [name], MD  fexofenadine (ALLEGRA) 180 MG tablet Take 180 mg by mouth daily.    [provider]  furosemide (LASIX) 40 MG tablet Take 40 mg by mouth daily as needed.     [provider]  mometasone (ELOCON) 0.1 % cream Apply BID to affected areas as directed Patient not taking: Reported on 05/13/2022 11/23/20   Ralene Bathe, MD  mupirocin ointment (BACTROBAN) 2 % Place 1 application into the nose daily. Qd t excision site Patient not taking: Reported on 05/13/2022 01/25/20   Ralene Bathe, MD  ondansetron (ZOFRAN ODT) 4 MG disintegrating tablet Take 1 tablet (4 mg total) by mouth every 8 (eight) hours as needed for nausea or vomiting. Patient not taking: Reported on 07/22/2016 07/05/16   [name], MD  potassium chloride (K-DUR) 10 MEQ tablet Take 10 mEq by mouth daily as needed. Patient not taking: Reported on 05/13/2022    [provider]  venlafaxine XR (EFFEXOR-XR) 37.5 MG 24 hr capsule Take 37.5 mg by mouth daily. 04/21/22   [provider]    Allergies:  Allergies as of 05/12/2022 - Review Complete 05/12/2022  Allergen Reaction Noted   Ace inhibitors Cough 07/11/2015   Beta adrenergic blockers Other (See Comments) 07/11/2015   Morphine and related Nausea And Vomiting 07/11/2015   Peanut-containing drug products Hives 07/11/2015   Codeine Nausea Only and Rash 07/11/2015     ROS:  General: Denies weight loss, weight gain, fatigue, fevers, chills, and night sweats. Eyes: Denies blurry vision, double vision, eye pain, itchy eyes, and tearing. Ears: Denies hearing loss, earache, and ringing in ears. Nose: Denies sinus pain, congestion, infections, runny nose, and nosebleeds. Mouth/throat: Denies hoarseness, sore throat, bleeding gums, and difficulty swallowing. Heart: Denies chest pain, palpitations, racing heart, irregular heartbeat, leg pain or swelling, and decreased activity tolerance. Respiratory: Denies breathing difficulty, shortness of breath, wheezing, cough, and sputum. GI: Denies change in appetite, heartburn, constipation, diarrhea, and blood in stool. GU: Denies difficulty urinating, pain with urinating, urgency, frequency, blood in urine. Musculoskeletal: Denies joint stiffness, pain, swelling, muscle weakness. Skin: Denies rash, itching, mass, tumors, sores, and boils Neurologic: Denies headache, fainting, dizziness, seizures, numbness, and tingling. Psychiatric: Denies depression, anxiety, difficulty sleeping, and memory loss. Endocrine: Denies heat or cold intolerance, and increased thirst or urination. Blood/lymph: Denies easy bruising, and swollen glands     Objective:     BP (!) 153/62 (BP Location: Left Arm)   Pulse 68   Temp 98.1 F (36.7 C)   Resp 20   Ht '5\' 3"'$  (1.6 m)   Wt 64.9 kg   SpO2 93%   BMI 25.33 kg/m    Constitutional :  alert, cooperative, appears stated age, and no distress  Lymphatics/Throat:  no asymmetry, masses, or scars  Respiratory:  clear to auscultation bilaterally  Cardiovascular:  regular rate and rhythm  Gastrointestinal: soft, non-tender; bowel sounds normal;  no masses,  no organomegaly.   Musculoskeletal: Steady movement  Skin: Cool and moist  Psychiatric: Normal affect, non-agitated, not confused       LABS:     Latest Ref Rng & Units 05/14/2022    5:50 AM 05/13/2022    5:00 PM 05/12/2022     9:19 PM  CMP  Glucose 70 - 99 mg/dL 87  100  90   BUN 8 - 23 mg/dL '10  12  18   '$ Creatinine 0.44 - 1.00 mg/dL 1.15  1.06  1.16   Sodium 135 - 145 mmol/L 142  138  141   Potassium 3.5 - 5.1 mmol/L 3.4  3.7  3.3   Chloride 98 - 111 mmol/L 111  106  109   CO2 22 - 32 mmol/L '26  26  24   '$ Calcium 8.9 - 10.3 mg/dL 8.4  8.3  9.0   Total Protein 6.5 - 8.1 g/dL 5.5   6.7   Total Bilirubin 0.3 - 1.2 mg/dL 0.9   0.5   Alkaline Phos 38 - 126 U/L 41   56   AST 15 - 41 U/L 23   25   ALT 0 - 44 U/L 11   13       Latest Ref Rng & Units 05/14/2022    5:50 AM 05/12/2022    9:19 PM 01/05/2018   12:51 PM  CBC  WBC 4.0 - 10.5 K/uL 3.3  3.5  3.9   Hemoglobin 12.0 - 15.0 g/dL 11.0  13.1  13.6   Hematocrit 36.0 - 46.0 % 34.2  40.5  39.8   Platelets 150 - 400 K/uL 149  196  187      RADS: N/a Assessment:      Choledocholithiasis status post ERCP  Plan:      Discussed the risk of surgery including post-op infxn, seroma, biloma, chronic pain, poor-delayed wound healing, retained gallstone, conversion to open procedure, post-op SBO or ileus, and need for additional procedures to address said risks.  The risks of general anesthetic including MI, CVA, sudden death or even reaction to anesthetic medications also discussed. Alternatives include continued observation.  Benefits include possible symptom relief, prevention of complications including acute cholecystitis, pancreatitis.  Typical post operative recovery of 3-5 days rest, continued pain in area and incision sites, possible loose stools up to 4-6 weeks, also discussed.  The patient understands the risks, any and all questions were answered to the patient's satisfaction.  Patient is a agreeable to proceed with robotic assisted lap chole.  We will schedule in the next day or 2 as time permits.  Continue to stay off Coumadin until then.  Further care per hospitalist and GI team until surgery.  labs/images/medications/previous chart entries reviewed  personally and relevant changes/updates noted above.

## 2022-05-14 NOTE — Anesthesia Preprocedure Evaluation (Addendum)
Anesthesia Evaluation  Patient identified by MRN, date of birth, ID band Patient awake    Reviewed: Allergy & Precautions, NPO status , Patient's Chart, lab work & pertinent test results  History of Anesthesia Complications Negative for: history of anesthetic complications  Airway Mallampati: I   Neck ROM: Full    Dental   Bridges :   Pulmonary COPD,    Pulmonary exam normal breath sounds clear to auscultation       Cardiovascular Normal cardiovascular exam+ dysrhythmias (a fib on warfarin)  Rhythm:Regular Rate:Normal  ECG 05/12/22:  Normal sinus rhythm Left axis deviation Low voltage QRS Nonspecific ST and T wave abnormality   Neuro/Psych negative neurological ROS     GI/Hepatic GERD  ,  Endo/Other  Hypothyroidism   Renal/GU Renal disease (stage III CKD)     Musculoskeletal  (+) Arthritis ,   Abdominal   Peds  Hematology  (+) Blood dyscrasia (pancytopenia), , Breast CA   Anesthesia Other Findings   Reproductive/Obstetrics                            Anesthesia Physical Anesthesia Plan  ASA: 3  Anesthesia Plan: General   Post-op Pain Management:    Induction: Intravenous and Rapid sequence  PONV Risk Score and Plan: 3 and Ondansetron, Dexamethasone and Treatment may vary due to age or medical condition  Airway Management Planned: Oral ETT  Additional Equipment:   Intra-op Plan:   Post-operative Plan: Extubation in OR  Informed Consent: I have reviewed the patients History and Physical, chart, labs and discussed the procedure including the risks, benefits and alternatives for the proposed anesthesia with the patient or authorized representative who has indicated his/her understanding and acceptance.   Patient has DNR.  Suspend DNR and Discussed DNR with patient.   Dental advisory given  Plan Discussed with: CRNA  Anesthesia Plan Comments: (Patient consented for risks  of anesthesia including but not limited to:  - adverse reactions to medications - damage to eyes, teeth, lips or other oral mucosa - nerve damage due to positioning  - sore throat or hoarseness - damage to heart, brain, nerves, lungs, other parts of body or loss of life  Informed patient about role of CRNA in peri- and intra-operative care.  Patient voiced understanding.)       Anesthesia Quick Evaluation

## 2022-05-15 ENCOUNTER — Encounter: Payer: Self-pay | Admitting: Gastroenterology

## 2022-05-15 DIAGNOSIS — N132 Hydronephrosis with renal and ureteral calculous obstruction: Secondary | ICD-10-CM | POA: Diagnosis not present

## 2022-05-15 DIAGNOSIS — K8050 Calculus of bile duct without cholangitis or cholecystitis without obstruction: Secondary | ICD-10-CM | POA: Diagnosis not present

## 2022-05-15 MED ORDER — BISACODYL 5 MG PO TBEC
5.0000 mg | DELAYED_RELEASE_TABLET | Freq: Every day | ORAL | Status: DC
  Administered 2022-05-15 – 2022-05-16 (×2): 5 mg via ORAL
  Filled 2022-05-15 (×2): qty 1

## 2022-05-15 MED ORDER — LORATADINE 10 MG PO TABS
10.0000 mg | ORAL_TABLET | Freq: Two times a day (BID) | ORAL | Status: DC
  Administered 2022-05-15 – 2022-05-17 (×3): 10 mg via ORAL
  Filled 2022-05-15 (×3): qty 1

## 2022-05-15 MED ORDER — DOCUSATE SODIUM 100 MG PO CAPS
100.0000 mg | ORAL_CAPSULE | Freq: Two times a day (BID) | ORAL | Status: DC
  Administered 2022-05-15 – 2022-05-17 (×4): 100 mg via ORAL
  Filled 2022-05-15 (×4): qty 1

## 2022-05-15 MED ORDER — BISACODYL 5 MG PO TBEC
10.0000 mg | DELAYED_RELEASE_TABLET | Freq: Once | ORAL | Status: AC
  Administered 2022-05-15: 10 mg via ORAL
  Filled 2022-05-15: qty 2

## 2022-05-15 MED ORDER — FLUTICASONE PROPIONATE 50 MCG/ACT NA SUSP
2.0000 | Freq: Every day | NASAL | Status: DC
  Administered 2022-05-15 – 2022-05-17 (×3): 2 via NASAL
  Filled 2022-05-15: qty 16

## 2022-05-15 NOTE — Progress Notes (Signed)
Mobility Specialist - Progress Note    05/15/22 0944  Mobility  Activity Stood at bedside;Ambulated with assistance in hallway;Dangled on edge of bed  Level of Assistance Standby assist, set-up cues, supervision of patient - no hands on  Assistive Device Other (Comment) (SPC)  Distance Ambulated (ft) 160 ft  Activity Response Tolerated well  $Mobility charge 1 Mobility   Pt supine in bed on RA upon arrival. Pt STS and ambulates in hallway SBA (No hands on). Pt has no complaints or LOB. Pt gait improved from previous session. Pt returns to EOB with needs in reach and bed alarm set.   Kelly Bishop  Mobility Specialist  05/15/22 9:47 AM

## 2022-05-15 NOTE — Progress Notes (Signed)
Mobility Specialist - Progress Note   05/15/22 1300  Mobility  Activity Ambulated independently in hallway;Stood at bedside;Dangled on edge of bed  Level of Assistance Standby assist, set-up cues, supervision of patient - no hands on  Assistive Device Other (Comment) (SPC)  Distance Ambulated (ft) 240 ft  Activity Response Tolerated well  $Mobility charge 1 Mobility   Pt supine in bed on RA upon arrival. Pt STS and ambulates in hallway SBA (No hands on).  Pt uses restroom indep. Pt has no complaints or LOB. Pt returns to bed with needs in reach and bed alarm set.   Gretchen Short  Mobility Specialist  05/15/22 1:36 PM

## 2022-05-15 NOTE — Progress Notes (Signed)
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  Symptoms have been going on for the past 5 to 6 days and patient had seen her PCP on 8/16.  CT scan done at that time noted right hydroureteronephrosis with a 3 mm mid to distal obstructing stone.  An MRI of the abdomen and pelvis done in the emergency department noted same mild right hydronephrosis, but now with moderate perinephric edema and hydronephrosis extending into the right hemipelvis suggesting distal migration of previously seen stone.  Patient was admitted to the hospitalist service and put on IV fluids and antibiotics plus pain control.  Urology consulted and felt no intervention needed and with fluids, stone should pass on its own.    GI consulted and patient underwent ERCP with stone removal on 05/14/22.     Assessment and Plan: Assessment and Plan: Hydronephrosis with obstructing calculus, right Secondary to transient obstructing stone.  Seen by urology.  Started on cefepime plus Flomax and IV fluids.  Urology feels stone should pass on its own.   Acute pyelonephritis Secondary to obstructing stone.   Continue cefepime.    Calculus of bile duct without cholecystitis and without obstruction Underwent ERCP on 8/22 by Dr. Allen Norris. Hold warfarin until 3 days post-procedure. General surgery consulted, planning for chole tomorrow. On full liquid diet, NPO after midnight Continue Cefepime, Flagyl  Cholelithiasis with choledocholithiasis As outlined  Cystic mass of pancreas Appears to be incidental  finding.   Suspected cystadenoma per radiology.  Further evaluation per GI  Atrial fibrillation ([name]) Continue amiodarone Hold warfarin until 3 days post-ERCP per GI   Benign essential hypertension Not on home antihypertensives other than Lasix daily PRN.   As needed hydralazine.  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  PAD (peripheral artery disease) ([name]) Resume aspirin  COPD (chronic obstructive pulmonary disease) ([name]) DuoNebs as needed  Stage 3a chronic kidney disease ([place]) Renal function at baseline.   Monitor BMP.       Body mass index is 25.33 kg/m.        Consultants: Urology Gastroenterology  Procedures: ERCP 8/22  Antimicrobials: Zosyn 8/20 IV cefepime 8/21 >> IV Flagul 8/21 >>   Code Status: DNR   Subjective: Patient seen awake resting in bed today.  Reports overall feeling okay.  Has not had a BM and typically is regular at home with use of  stool softener and dulcolax at bedtime.  No other acute comlpaints.    Objective: Noted elevated blood pressures Vitals:   05/15/22 0512 05/15/22 0855  BP: (!) 145/68 (!) 150/69  Pulse: 65 62  Resp: 18 (!) 22  Temp: 97.7 F (36.5 C) 98.3 F (36.8 C)  SpO2: 93% 95%     Filed Weights   05/12/22 2112  Weight: 64.9 kg   Body mass index is 25.33 kg/m.  Exam:  General exam: awake, alert, no acute distress HEENT: atraumatic, clear conjunctiva, anicteric sclera, moist mucus membranes, hearing grossly normal  Respiratory system: CTAB, no wheezes, rales or rhonchi,  normal respiratory effort. Cardiovascular system: normal S1/S2, RRR, no JVD, murmurs, rubs, gallops, no pedal edema.   Gastrointestinal system: soft, NT, ND, no HSM felt, +bowel sounds. Central nervous system: A&O x 3. no gross focal neurologic deficits, normal speech Extremities: moves all, normal tone Skin: hyperpigmented distal lower  extremities Psychiatry: normal mood, congruent affect, judgement and insight appear normal    Data Reviewed: No new labs today   Disposition:  Status is: Inpatient Remains inpatient appropriate because:  -closely monitor s/p ERCP today -resume anticoagulation   Anticipated discharge date: 8/23  Family Communication: Sister at the bedside   DVT Prophylaxis: SCDs Start: 05/13/22 0329    Author: Ezekiel Slocumb , DO 05/15/2022 2:44 PM  To reach On-call, see care teams to locate the attending and reach out via [email or web address]. Between 7PM-7AM, please contact night-coverage If you still have difficulty reaching the attending provider, please page the [facility] (Director on Call) for [facility] on amion for assistance.

## 2022-05-15 NOTE — Progress Notes (Signed)
Inpatient Follow-up/Progress Note   Patient ID: Kelly Bishop is a 88 y.o. female.  Overnight Events / Subjective Findings S/p ercp yesterday with sphincterotomy and stone extraction. Doing well. No abdominal pain. General Surgery has seen with plans for cholecystectomy. No n/v. Some constipation- no BM since Sunday. No other acute gi complaints.  Review of Systems  Constitutional:  Negative for activity change, appetite change, chills, diaphoresis, fatigue, fever and unexpected weight change.  HENT:  Negative for trouble swallowing and voice change.   Respiratory:  Negative for shortness of breath and wheezing.   Cardiovascular:  Negative for chest pain, palpitations and leg swelling.  Gastrointestinal:  Negative for abdominal distention, abdominal pain, anal bleeding, blood in stool, constipation, diarrhea, nausea, rectal pain and vomiting.  Musculoskeletal:  Negative for arthralgias and myalgias.  Skin:  Negative for color change and pallor.  Neurological:  Negative for dizziness, syncope and weakness.  Psychiatric/Behavioral:  Negative for confusion.   All other systems reviewed and are negative.    Medications  Current Facility-Administered Medications:    acetaminophen (TYLENOL) tablet 650 mg, 650 mg, Oral, Q6H PRN, 650 mg at 05/13/22 1619 **OR** acetaminophen (TYLENOL) suppository 650 mg, 650 mg, Rectal, Q6H PRN, Athena Masse, MD   ALPRAZolam Duanne Moron) tablet 0.25 mg, 0.25 mg, Oral, QHS, Nicole Kindred A, DO, 0.25 mg at 05/14/22 2136   amiodarone (PACERONE) tablet 300 mg, 300 mg, Oral, Daily, Nicole Kindred A, DO, 300 mg at 05/15/22 0854   bisacodyl (DULCOLAX) EC tablet 5 mg, 5 mg, Oral, QHS, Griffith, Kelly A, DO   ceFEPIme (MAXIPIME) 2 g in sodium chloride 0.9 % 100 mL IVPB, 2 g, Intravenous, Q12H, Judd Gaudier V, MD, Last Rate: 200 mL/hr at 05/15/22 0808, 2 g at 05/15/22 0932   docusate sodium (COLACE) capsule 100 mg, 100 mg, Oral, BID, Nicole Kindred A, DO, 100 mg  at 05/15/22 1147   fluticasone (FLONASE) 50 MCG/ACT nasal spray 2 spray, 2 spray, Each Nare, Daily, Nicole Kindred A, DO, 2 spray at 05/15/22 1148   hydrALAZINE (APRESOLINE) injection 10 mg, 10 mg, Intravenous, Q4H PRN, Wynelle Cleveland, RPH   levothyroxine (SYNTHROID) tablet 50 mcg, 50 mcg, Oral, q morning, Nicole Kindred A, DO, 50 mcg at 05/15/22 0514   loratadine (CLARITIN) tablet 10 mg, 10 mg, Oral, BID, Nicole Kindred A, DO   metroNIDAZOLE (FLAGYL) IVPB 500 mg, 500 mg, Intravenous, Q12H, Judd Gaudier V, MD, Last Rate: 100 mL/hr at 05/15/22 0854, 500 mg at 05/15/22 0854   morphine (PF) 2 MG/ML injection 2 mg, 2 mg, Intravenous, Q2H PRN, Judd Gaudier V, MD, 2 mg at 05/14/22 0520   ondansetron (ZOFRAN) tablet 4 mg, 4 mg, Oral, Q6H PRN **OR** ondansetron (ZOFRAN) injection 4 mg, 4 mg, Intravenous, Q6H PRN, Judd Gaudier V, MD, 4 mg at 05/14/22 1714   prochlorperazine (COMPAZINE) injection 5 mg, 5 mg, Intravenous, Q4H PRN, Judd Gaudier V, MD, 5 mg at 05/14/22 2031   tamsulosin (FLOMAX) capsule 0.4 mg, 0.4 mg, Oral, Daily, Judd Gaudier V, MD, 0.4 mg at 05/15/22 0854   venlafaxine XR (EFFEXOR-XR) 24 hr capsule 37.5 mg, 37.5 mg, Oral, Daily, Nicole Kindred A, DO, 37.5 mg at 05/15/22 0854  ceFEPime (MAXIPIME) IV 2 g (05/15/22 6712)   metronidazole 500 mg (05/15/22 0854)    acetaminophen **OR** acetaminophen, hydrALAZINE, morphine injection, ondansetron **OR** ondansetron (ZOFRAN) IV, prochlorperazine   Objective    Vitals:   05/14/22 1944 05/14/22 2042 05/15/22 0512 05/15/22 0855  BP: (!) 176/92 (!) 153/62 Marland Kitchen)  145/68 (!) 150/69  Pulse: 86 68 65 62  Resp: 20  18 (!) 22  Temp: 98.1 F (36.7 C)  97.7 F (36.5 C) 98.3 F (36.8 C)  TempSrc:      SpO2: 93%  93% 95%  Weight:      Height:         Physical Exam Vitals and nursing note reviewed.  Constitutional:      General: She is not in acute distress.    Appearance: Normal appearance. She is not ill-appearing, toxic-appearing  or diaphoretic.  HENT:     Head: Normocephalic and atraumatic.     Nose: Nose normal.     Mouth/Throat:     Mouth: Mucous membranes are moist.     Pharynx: Oropharynx is clear.  Eyes:     General: No scleral icterus.    Extraocular Movements: Extraocular movements intact.  Cardiovascular:     Rate and Rhythm: Normal rate and regular rhythm.     Heart sounds: Normal heart sounds. No murmur heard.    No friction rub. No gallop.  Pulmonary:     Effort: Pulmonary effort is normal. No respiratory distress.     Breath sounds: Normal breath sounds. No wheezing, rhonchi or rales.  Abdominal:     General: Bowel sounds are normal. There is no distension.     Palpations: Abdomen is soft.     Tenderness: There is no abdominal tenderness. There is no guarding or rebound.  Musculoskeletal:     Cervical back: Neck supple.     Right lower leg: No edema.     Left lower leg: No edema.  Skin:    General: Skin is warm and dry.     Coloration: Skin is not jaundiced or pale.  Neurological:     General: No focal deficit present.     Mental Status: She is alert and oriented to person, place, and time. Mental status is at baseline.  Psychiatric:        Mood and Affect: Mood normal.        Behavior: Behavior normal.        Thought Content: Thought content normal.        Judgment: Judgment normal.      Laboratory Data Recent Labs  Lab 05/12/22 2119 05/14/22 0550  WBC 3.5* 3.3*  HGB 13.1 11.0*  HCT 40.5 34.2*  PLT 196 149*   Recent Labs  Lab 05/12/22 2119 05/13/22 1700 05/14/22 0550  NA 141 138 142  K 3.3* 3.7 3.4*  CL 109 106 111  CO2 '24 26 26  '$ BUN '18 12 10  '$ CREATININE 1.16* 1.06* 1.15*  CALCIUM 9.0 8.3* 8.4*  PROT 6.7  --  5.5*  BILITOT 0.5  --  0.9  ALKPHOS 56  --  41  ALT 13  --  11  AST 25  --  23  GLUCOSE 90 100* 87   Recent Labs  Lab 05/13/22 0027 05/14/22 0550  INR 1.4* 1.5*      Imaging Studies: DG C-Arm 1-60 Min-No Report  Result Date:  05/14/2022 Fluoroscopy was utilized by the requesting physician.  No radiographic interpretation.    Assessment:   # Choledocholithiasis s/p ercp with sphincterotomy and stone extraction (05/14/22) - pt doing well post ercp - CT on 8/17 and MRCP on 8/21 revealing choledocholithiasis - lfts and wbc wnl; currently without cholangitis - 4x54m stone in distal CBD; no IH dilation - cbd measuring ~8 mm   # Pancreatic cyst -  10x50m multi cystic lesion at the head of the pancreas with honey combing features - mrcp remarking on serous cystadenoma which is benign - no PD Dilation - recommended repeat mri with contrast as outpatient - can potentially consider EUS as outpatient   # complete pancreatic divisum - dorsal duct draining to minor papilla   # A fib on coumadin - inr 1.4   # Right sided abdominal pain   # constipation - moderate stool on imaging in the colon   # Zenker's diverticulum s/p repair - surgery 02/2012   # R hydronephrosis with stone - urology following  Plan:  Pt doing well post ercp- coumadin would need to be held 3days post sphincterotomy General surgery has evaluated and plans for cholecystectomy No further inpatient GI interventions planned Continue with bowel regimen  Follow up with GI clinic for pancreatic cyst evaluation. Pt provided business card for contact  GI to sign off. Available as needed. Please do not hesitate to call regarding questions or concerns.   I personally performed the service.  Management of other medical comorbidities as per primary team  Thank you for allowing [facility] participate in this patient's care.   SAnnamaria Helling DO [facility]  Portions of the record may have been created with voice recognition software. Occasional wrong-word or 'sound-a-like' substitutions may have occurred due to the inherent limitations of voice recognition software.  Read the chart carefully and recognize, using context,  where substitutions may have occurred.

## 2022-05-15 NOTE — Progress Notes (Cosign Needed Addendum)
Pharmacy Antibiotic Note  Kelly Bishop is a 88 y.o. female admitted on 05/13/2022 with UTI. PMH significant for PAF (on amio and warfarin), depression, hypothyroidism, PAD, chronic spine infection (on ciprofloxacin PTA), Zenker's diverticulum on Reglan. Patient has acute pyelonephritis 2/2 obstructing stones seen on CTAP and MRCP. Patient underwent ERCP with stone removal on 05/14/22. Pharmacy has been consulted for Cefepime dosing.  Plan: Day 3 of antibiotics. Continue cefepime 2 g IV Q12H. Patient is also taking Flagyl 500 mg Q12H. Continue to monitor renal function while on cefepime.  Height: '5\' 3"'$  (160 cm) Weight: 64.9 kg (143 lb) IBW/kg (Calculated) : 52.4  Temp (24hrs), Avg:97.5 F (36.4 C), Min:95.4 F (35.2 C), Max:98.3 F (36.8 C)  Recent Labs  Lab 05/12/22 2119 05/13/22 1700 05/14/22 0550  WBC 3.5*  --  3.3*  CREATININE 1.16* 1.06* 1.15*     Estimated Creatinine Clearance: 30.6 mL/min (A) (by C-G formula based on SCr of 1.15 mg/dL (H)).    Allergies  Allergen Reactions   Ace Inhibitors Cough   Beta Adrenergic Blockers Other (See Comments)   Morphine And Related Nausea And Vomiting   Peanut-Containing Drug Products Hives   Codeine Nausea Only and Rash    Antimicrobials this admission: 8/21 Zosyn x1 8/21 Flagyl >>  8/21 Cefepime >>  Dose adjustments this admission: N/A  Microbiology results: None ordered  Thank you for allowing pharmacy to be a part of this patient's care.  Gretel Acre, PharmD PGY1 Pharmacy Resident 05/15/2022 12:35 PM

## 2022-05-15 NOTE — Plan of Care (Signed)

## 2022-05-16 ENCOUNTER — Inpatient Hospital Stay: Payer: PPO | Admitting: Certified Registered"

## 2022-05-16 ENCOUNTER — Other Ambulatory Visit: Payer: Self-pay

## 2022-05-16 ENCOUNTER — Encounter
Admission: EM | Disposition: A | Discharge status: Home Health | Payer: Self-pay | Source: Home / Self Care | Attending: Internal Medicine

## 2022-05-16 ENCOUNTER — Encounter: Payer: Self-pay | Admitting: Internal Medicine

## 2022-05-16 DIAGNOSIS — N132 Hydronephrosis with renal and ureteral calculous obstruction: Secondary | ICD-10-CM | POA: Diagnosis not present

## 2022-05-16 DIAGNOSIS — K8050 Calculus of bile duct without cholangitis or cholecystitis without obstruction: Secondary | ICD-10-CM | POA: Diagnosis not present

## 2022-05-16 DIAGNOSIS — E876 Hypokalemia: Secondary | ICD-10-CM

## 2022-05-16 DIAGNOSIS — Z792 Long term (current) use of antibiotics: Secondary | ICD-10-CM | POA: Diagnosis not present

## 2022-05-16 HISTORY — DX: Hypokalemia: E87.6

## 2022-05-16 LAB — CBC
HCT: 33.3 % — ABNORMAL LOW (ref 36.0–46.0)
Hemoglobin: 10.9 g/dL — ABNORMAL LOW (ref 12.0–15.0)
MCH: 29.9 pg (ref 26.0–34.0)
MCHC: 32.7 g/dL (ref 30.0–36.0)
MCV: 91.5 fL (ref 80.0–100.0)
Platelets: 150 10*3/uL (ref 150–400)
RBC: 3.64 MIL/uL — ABNORMAL LOW (ref 3.87–5.11)
RDW: 13.1 % (ref 11.5–15.5)
WBC: 3.8 10*3/uL — ABNORMAL LOW (ref 4.0–10.5)
nRBC: 0 % (ref 0.0–0.2)

## 2022-05-16 LAB — MAGNESIUM: Magnesium: 2 mg/dL (ref 1.7–2.4)

## 2022-05-16 LAB — COMPREHENSIVE METABOLIC PANEL
ALT: 14 U/L (ref 0–44)
AST: 26 U/L (ref 15–41)
Albumin: 2.9 g/dL — ABNORMAL LOW (ref 3.5–5.0)
Alkaline Phosphatase: 41 U/L (ref 38–126)
Anion gap: 6 (ref 5–15)
BUN: 12 mg/dL (ref 8–23)
CO2: 26 mmol/L (ref 22–32)
Calcium: 8.4 mg/dL — ABNORMAL LOW (ref 8.9–10.3)
Chloride: 108 mmol/L (ref 98–111)
Creatinine, Ser: 1.04 mg/dL — ABNORMAL HIGH (ref 0.44–1.00)
GFR, Estimated: 52 mL/min — ABNORMAL LOW (ref >60)
Glucose, Bld: 77 mg/dL (ref 70–99)
Potassium: 3.1 mmol/L — ABNORMAL LOW (ref 3.5–5.1)
Sodium: 140 mmol/L (ref 135–145)
Total Bilirubin: 0.7 mg/dL (ref 0.3–1.2)
Total Protein: 5.2 g/dL — ABNORMAL LOW (ref 6.5–8.1)

## 2022-05-16 SURGERY — CHOLECYSTECTOMY, ROBOT-ASSISTED, LAPAROSCOPIC
Anesthesia: General

## 2022-05-16 MED ORDER — FENTANYL CITRATE (PF) 100 MCG/2ML IJ SOLN
INTRAMUSCULAR | Status: AC
  Filled 2022-05-16: qty 2

## 2022-05-16 MED ORDER — PHENYLEPHRINE 80 MCG/ML (10ML) SYRINGE FOR IV PUSH (FOR BLOOD PRESSURE SUPPORT)
PREFILLED_SYRINGE | INTRAVENOUS | Status: AC
  Filled 2022-05-16: qty 10

## 2022-05-16 MED ORDER — ONDANSETRON HCL 4 MG/2ML IJ SOLN
INTRAMUSCULAR | Status: DC | PRN
  Administered 2022-05-16: 4 mg via INTRAVENOUS

## 2022-05-16 MED ORDER — ROCURONIUM BROMIDE 10 MG/ML (PF) SYRINGE
PREFILLED_SYRINGE | INTRAVENOUS | Status: AC
  Filled 2022-05-16: qty 10

## 2022-05-16 MED ORDER — LACTATED RINGERS IV SOLN: INTRAVENOUS | Status: DC

## 2022-05-16 MED ORDER — SUGAMMADEX SODIUM 200 MG/2ML IV SOLN
INTRAVENOUS | Status: DC | PRN
  Administered 2022-05-16: 200 mg via INTRAVENOUS

## 2022-05-16 MED ORDER — LIDOCAINE-EPINEPHRINE (PF) 1 %-1:200000 IJ SOLN
INTRAMUSCULAR | Status: AC
  Filled 2022-05-16: qty 30

## 2022-05-16 MED ORDER — INDOCYANINE GREEN 25 MG IV SOLR
1.2500 mg | INTRAVENOUS | Status: AC
  Administered 2022-05-16: 1.25 mg via INTRAVENOUS
  Filled 2022-05-16: qty 10
  Filled 2022-05-16: qty 0.5

## 2022-05-16 MED ORDER — POTASSIUM CHLORIDE 2 MEQ/ML IV SOLN
INTRAVENOUS | Status: DC
  Filled 2022-05-16 (×4): qty 1000

## 2022-05-16 MED ORDER — ACETAMINOPHEN 10 MG/ML IV SOLN
INTRAVENOUS | Status: AC
  Filled 2022-05-16: qty 100

## 2022-05-16 MED ORDER — FENTANYL CITRATE (PF) 100 MCG/2ML IJ SOLN
INTRAMUSCULAR | Status: DC | PRN
  Administered 2022-05-16 (×2): 50 ug via INTRAVENOUS

## 2022-05-16 MED ORDER — INDOCYANINE GREEN 25 MG IV SOLR
1.2500 mg | Freq: Once | INTRAVENOUS | Status: DC
  Filled 2022-05-16: qty 10

## 2022-05-16 MED ORDER — DEXAMETHASONE SODIUM PHOSPHATE 10 MG/ML IJ SOLN
INTRAMUSCULAR | Status: DC | PRN
  Administered 2022-05-16: 5 mg via INTRAVENOUS

## 2022-05-16 MED ORDER — ROCURONIUM BROMIDE 100 MG/10ML IV SOLN
INTRAVENOUS | Status: DC | PRN
  Administered 2022-05-16: 30 mg via INTRAVENOUS

## 2022-05-16 MED ORDER — OXYCODONE HCL 5 MG PO TABS: 5.0000 mg | ORAL_TABLET | Freq: Once | ORAL | Status: DC | PRN

## 2022-05-16 MED ORDER — LIDOCAINE-EPINEPHRINE (PF) 1 %-1:200000 IJ SOLN
INTRAMUSCULAR | Status: DC | PRN
  Administered 2022-05-16: 20 mL via INTRAMUSCULAR

## 2022-05-16 MED ORDER — OXYCODONE HCL 5 MG/5ML PO SOLN: 5.0000 mg | Freq: Once | ORAL | Status: DC | PRN

## 2022-05-16 MED ORDER — ACETAMINOPHEN 10 MG/ML IV SOLN
INTRAVENOUS | Status: DC | PRN
  Administered 2022-05-16: 1000 mg via INTRAVENOUS

## 2022-05-16 MED ORDER — LIDOCAINE HCL (CARDIAC) PF 100 MG/5ML IV SOSY
PREFILLED_SYRINGE | INTRAVENOUS | Status: DC | PRN
  Administered 2022-05-16: 100 mg via INTRAVENOUS

## 2022-05-16 MED ORDER — SUCCINYLCHOLINE CHLORIDE 200 MG/10ML IV SOSY
PREFILLED_SYRINGE | INTRAVENOUS | Status: AC
  Filled 2022-05-16: qty 10

## 2022-05-16 MED ORDER — ONDANSETRON HCL 4 MG/2ML IJ SOLN: 4.0000 mg | Freq: Once | INTRAMUSCULAR | Status: DC | PRN

## 2022-05-16 MED ORDER — BUPIVACAINE HCL (PF) 0.5 % IJ SOLN
INTRAMUSCULAR | Status: AC
  Filled 2022-05-16: qty 30

## 2022-05-16 MED ORDER — PROPOFOL 10 MG/ML IV BOLUS
INTRAVENOUS | Status: DC | PRN
  Administered 2022-05-16: 80 mg via INTRAVENOUS

## 2022-05-16 MED ORDER — ACETAMINOPHEN 10 MG/ML IV SOLN: 1000.0000 mg | Freq: Once | INTRAVENOUS | Status: DC | PRN

## 2022-05-16 MED ORDER — PHENYLEPHRINE HCL (PRESSORS) 10 MG/ML IV SOLN
INTRAVENOUS | Status: DC | PRN
  Administered 2022-05-16 (×3): 80 ug via INTRAVENOUS

## 2022-05-16 MED ORDER — SUCCINYLCHOLINE CHLORIDE 200 MG/10ML IV SOSY
PREFILLED_SYRINGE | INTRAVENOUS | Status: DC | PRN
  Administered 2022-05-16: 100 mg via INTRAVENOUS

## 2022-05-16 MED ORDER — FENTANYL CITRATE (PF) 100 MCG/2ML IJ SOLN: 25.0000 ug | INTRAMUSCULAR | Status: DC | PRN

## 2022-05-16 MED ORDER — PROPOFOL 10 MG/ML IV BOLUS
INTRAVENOUS | Status: AC
  Filled 2022-05-16: qty 20

## 2022-05-16 MED ORDER — LACTATED RINGERS IV SOLN: INTRAVENOUS | Status: DC | PRN

## 2022-05-16 MED ORDER — DEXAMETHASONE SODIUM PHOSPHATE 10 MG/ML IJ SOLN
INTRAMUSCULAR | Status: AC
  Filled 2022-05-16: qty 1

## 2022-05-16 MED ORDER — LIDOCAINE HCL (PF) 2 % IJ SOLN
INTRAMUSCULAR | Status: AC
  Filled 2022-05-16: qty 5

## 2022-05-16 MED ORDER — ONDANSETRON HCL 4 MG/2ML IJ SOLN
INTRAMUSCULAR | Status: AC
  Filled 2022-05-16: qty 2

## 2022-05-16 SURGICAL SUPPLY — 54 items
ANCHOR TIS RET SYS 235ML (MISCELLANEOUS) ×1 IMPLANT
BAG PRESSURE INF REUSE 1000 (BAG) IMPLANT
BLADE SURG SZ11 CARB STEEL (BLADE) ×1 IMPLANT
CANNULA REDUC XI 12-8 STAPL (CANNULA) ×1
CANNULA REDUCER 12-8 DVNC XI (CANNULA) ×1 IMPLANT
CATH REDDICK CHOLANGI 4FR 50CM (CATHETERS) IMPLANT
CLIP LIGATING HEMO O LOK GREEN (MISCELLANEOUS) ×1 IMPLANT
DERMABOND ADVANCED (GAUZE/BANDAGES/DRESSINGS) ×1
DERMABOND ADVANCED .7 DNX12 (GAUZE/BANDAGES/DRESSINGS) ×1 IMPLANT
DRAPE ARM DVNC X/XI (DISPOSABLE) ×4 IMPLANT
DRAPE C-ARM XRAY 36X54 (DRAPES) IMPLANT
DRAPE COLUMN DVNC XI (DISPOSABLE) ×1 IMPLANT
DRAPE DA VINCI XI ARM (DISPOSABLE) ×4
DRAPE DA VINCI XI COLUMN (DISPOSABLE) ×1
ELECT CAUTERY BLADE 6.4 (BLADE) ×1 IMPLANT
ELECT REM PT RETURN 9FT ADLT (ELECTROSURGICAL) ×1
ELECTRODE REM PT RTRN 9FT ADLT (ELECTROSURGICAL) ×1 IMPLANT
GLOVE BIOGEL PI IND STRL 7.0 (GLOVE) ×2 IMPLANT
GLOVE BIOGEL PI INDICATOR 7.0 (GLOVE) ×2
GLOVE SURG SYN 6.5 ES PF (GLOVE) ×2 IMPLANT
GLOVE SURG SYN 6.5 PF PI (GLOVE) ×2 IMPLANT
GOWN STRL REUS W/ TWL LRG LVL3 (GOWN DISPOSABLE) ×3 IMPLANT
GOWN STRL REUS W/TWL LRG LVL3 (GOWN DISPOSABLE) ×3
GRASPER SUT TROCAR 14GX15 (MISCELLANEOUS) IMPLANT
IRRIGATOR SUCT 8 DISP DVNC XI (IRRIGATION / IRRIGATOR) IMPLANT
IRRIGATOR SUCTION 8MM XI DISP (IRRIGATION / IRRIGATOR)
IV NS 1000ML (IV SOLUTION)
IV NS 1000ML BAXH (IV SOLUTION) IMPLANT
LABEL OR SOLS (LABEL) ×1 IMPLANT
MANIFOLD NEPTUNE II (INSTRUMENTS) ×1 IMPLANT
NDL INSUFFLATION 14GA 120MM (NEEDLE) ×1 IMPLANT
NEEDLE HYPO 22GX1.5 SAFETY (NEEDLE) ×1 IMPLANT
NEEDLE INSUFFLATION 14GA 120MM (NEEDLE) ×1 IMPLANT
NS IRRIG 500ML POUR BTL (IV SOLUTION) ×1 IMPLANT
OBTURATOR OPTICAL STANDARD 8MM (TROCAR) ×1
OBTURATOR OPTICAL STND 8 DVNC (TROCAR) ×1
OBTURATOR OPTICALSTD 8 DVNC (TROCAR) ×1 IMPLANT
PACK LAP CHOLECYSTECTOMY (MISCELLANEOUS) ×1 IMPLANT
PENCIL SMOKE EVACUATOR (MISCELLANEOUS) ×1 IMPLANT
SEAL CANN UNIV 5-8 DVNC XI (MISCELLANEOUS) ×3 IMPLANT
SEAL XI 5MM-8MM UNIVERSAL (MISCELLANEOUS) ×3
SET TUBE SMOKE EVAC HIGH FLOW (TUBING) ×1 IMPLANT
SOLUTION ELECTROLUBE (MISCELLANEOUS) ×1 IMPLANT
SPIKE FLUID TRANSFER (MISCELLANEOUS) ×2 IMPLANT
STAPLER CANNULA SEAL DVNC XI (STAPLE) ×1 IMPLANT
STAPLER CANNULA SEAL XI (STAPLE) ×1
SUT MNCRL 4-0 (SUTURE) ×2
SUT MNCRL 4-0 27XMFL (SUTURE) ×2
SUT VICRYL 0 AB UR-6 (SUTURE) ×1 IMPLANT
SUTURE MNCRL 4-0 27XMF (SUTURE) ×2 IMPLANT
SYR 30ML LL (SYRINGE) IMPLANT
SYSTEM WECK SHIELD CLOSURE (TROCAR) IMPLANT
TRAP FLUID SMOKE EVACUATOR (MISCELLANEOUS) ×1 IMPLANT
WATER STERILE IRR 500ML POUR (IV SOLUTION) ×1 IMPLANT

## 2022-05-16 NOTE — Anesthesia Preprocedure Evaluation (Signed)
Anesthesia Evaluation  Patient identified by MRN, date of birth, ID band Patient awake    Reviewed: Allergy & Precautions, NPO status , Patient's Chart, lab work & pertinent test results  History of Anesthesia Complications Negative for: history of anesthetic complications  Airway Mallampati: I   Neck ROM: Full    Dental   Bridges :   Pulmonary COPD,    Pulmonary exam normal breath sounds clear to auscultation       Cardiovascular Normal cardiovascular exam+ dysrhythmias (a fib on warfarin)  Rhythm:Regular Rate:Normal  ECG 05/12/22:  Normal sinus rhythm Left axis deviation Low voltage QRS Nonspecific ST and T wave abnormality   Neuro/Psych negative neurological ROS     GI/Hepatic GERD  ,  Endo/Other  Hypothyroidism   Renal/GU Renal disease (stage III CKD)     Musculoskeletal  (+) Arthritis ,   Abdominal   Peds  Hematology  (+) Blood dyscrasia (pancytopenia), , Breast CA   Anesthesia Other Findings   Reproductive/Obstetrics                             Anesthesia Physical  Anesthesia Plan  ASA: 3  Anesthesia Plan: General   Post-op Pain Management:    Induction: Intravenous and Rapid sequence  PONV Risk Score and Plan: 3 and Ondansetron, Dexamethasone and Treatment may vary due to age or medical condition  Airway Management Planned: Oral ETT  Additional Equipment:   Intra-op Plan:   Post-operative Plan: Extubation in OR  Informed Consent: I have reviewed the patients History and Physical, chart, labs and discussed the procedure including the risks, benefits and alternatives for the proposed anesthesia with the patient or authorized representative who has indicated his/her understanding and acceptance.   Patient has DNR.  Suspend DNR and Discussed DNR with patient.   Dental advisory given  Plan Discussed with: CRNA  Anesthesia Plan Comments: (Patient consented for  risks of anesthesia including but not limited to:  - adverse reactions to medications - damage to eyes, teeth, lips or other oral mucosa - nerve damage due to positioning  - sore throat or hoarseness - damage to heart, brain, nerves, lungs, other parts of body or loss of life  Informed patient about role of CRNA in peri- and intra-operative care.  Patient voiced understanding.)        Anesthesia Quick Evaluation

## 2022-05-16 NOTE — Interval H&P Note (Signed)
No change. OK to proceed.

## 2022-05-16 NOTE — Anesthesia Postprocedure Evaluation (Signed)
Anesthesia Post Note  Patient: Kelly Bishop  Procedure(s) Performed: XI ROBOTIC ASSISTED LAPAROSCOPIC CHOLECYSTECTOMY INDOCYANINE GREEN FLUORESCENCE IMAGING (ICG)  Patient location during evaluation: PACU Anesthesia Type: General Level of consciousness: awake and alert, oriented and patient cooperative Pain management: pain level controlled Vital Signs Assessment: post-procedure vital signs reviewed and stable Respiratory status: spontaneous breathing, nonlabored ventilation and respiratory function stable Cardiovascular status: blood pressure returned to baseline and stable Postop Assessment: adequate PO intake Anesthetic complications: no   No notable events documented.   Last Vitals:  Vitals:   05/16/22 1316 05/16/22 1330  BP: (!) 165/66 (!) 166/60  Pulse: 62 (!) 59  Resp: 15 11  Temp:  (!) 36.2 C  SpO2: 99% 99%    Last Pain:  Vitals:   05/16/22 1316  TempSrc:   PainSc: 0-No pain                 Darrin Nipper

## 2022-05-16 NOTE — Assessment & Plan Note (Addendum)
Replacing further for K 3.1 this AM. Replaced with IV fluid additive while NPO, now tolerating PO.   --Repeat labs in 1 weeks

## 2022-05-16 NOTE — Progress Notes (Addendum)
[facility] Progress Note  Patient: Kelly Bishop    SKA:[id number]  DOA: 05/13/2022    Date of Service: the patient was seen and examined on 05/16/2022  Brief hospital course: 88 year old female with past medical history of stage IIIa chronic kidney disease paroxysmal atrial fibrillation, chronic spine infection on daily ofloxacin and hypothyroidism who presented to the emergency room on the night of 8/20 with worsening right lower quadrant pain and intermittent rectal bleeding.  Symptoms have been going on for the past 5 to 6 days and patient had seen her PCP on 8/16.  CT scan done at that time noted right hydroureteronephrosis with a 3 mm mid to distal obstructing stone.  An MRI of the abdomen and pelvis done in the emergency department noted same mild right hydronephrosis, but now with moderate perinephric edema and hydronephrosis extending into the right hemipelvis suggesting distal migration of previously seen stone.  Patient was admitted to the hospitalist service and put on IV fluids and antibiotics plus pain control.  Urology consulted and felt no intervention needed and with fluids, stone should pass on its own.    GI consulted and patient underwent ERCP with stone removal on 05/14/22.      Assessment and Plan: Hydronephrosis with obstructing calculus, right Secondary to transient obstructing stone.  Seen by urology.  Started on cefepime plus Flomax and IV fluids.  Urology feels stone should pass on its own.   Acute pyelonephritis Secondary to obstructing ureteral stone.   Continue cefepime.    Calculus of bile duct without cholecystitis and without obstruction Underwent ERCP on 8/22 by Dr. Allen Norris. Hold warfarin until 3 days post-procedure. General surgery consulted. Cholecystectomy planned for today Resume diet per surgery post-operatively Continue Cefepime, Flagyl  Cholelithiasis with choledocholithiasis As outlined  Cystic mass of pancreas Appears to be incidental  finding.   Suspected cystadenoma per radiology.  Further evaluation per GI  Atrial fibrillation ([name]) Continue amiodarone Hold warfarin until 3 days post-ERCP per GI   Benign essential hypertension Not on home antihypertensives other than Lasix daily PRN.   As needed hydralazine.  Hypokalemia Replacing further for K 3.1 this AM. Will replace with IV fluid additive given NPO for surgery and having active nausea. Monitor BMP, replace K PRN, goal K 4.0  Long term (current) use of antibiotics Patient is on long-term daily Cipro x 7 years for bone infection related to spinal fusion.  PAD (peripheral artery disease) ([name]) Resume aspirin  COPD (chronic obstructive pulmonary disease) ([name]) DuoNebs as needed  Stage 3a chronic kidney disease ([place]) Renal function at baseline.   Monitor BMP.       Body mass index is 25.33 kg/m.        Consultants: Urology Gastroenterology  Procedures: ERCP with CBD stone removal and sphincterotomy 8/22 Laparoscopic robot-assisted cholecystectomy 8/24  Antimicrobials: Zosyn 8/20 IV cefepime 8/21 >> IV Flagyl 8/21 >>   Code Status: DNR   Subjective: Patient was a resting in bed when seen on rounds this morning.  She reported feeling queasy like she may vomit but did not.  She hopes to get discharged after surgery today, states she is ready to go home.  No other acute complaints at this time.  Objective: Noted elevated blood pressures Vitals:   05/16/22 1316 05/16/22 1330  BP: (!) 165/66 (!) 166/60  Pulse: 62 (!) 59  Resp: 15 11  Temp:  (!) 97.2 F (36.2 C)  SpO2: 99% 99%     Filed Weights   05/12/22 2112  Weight: 64.9 kg   Body mass index is 25.33 kg/m.  Exam:  General exam: awake, alert, no acute distress HEENT: Dry mucus membranes, hearing grossly normal  Respiratory system: On room air, normal respiratory effort. Cardiovascular system: normal S1/S2, RRR   Gastrointestinal system: soft, nontender nondistended  abdomen Central nervous system: A&O x 3. no gross focal neurologic deficits, normal speech Extremities: moves all, normal tone Psychiatry: normal mood, congruent affect, judgement and insight appear normal    Data Reviewed: Complete metabolic panel notable for potassium 3.1, creatinine 1.04, calcium 8.4 with albumin 2.9, GFR 52.  CBC with white count 3.8 up from 3.3, hemoglobin stable 10.9   Disposition:  Status is: Inpatient Remains inpatient appropriate because:  -closely monitor s/p cholecystecomy -remains on IV antibiotics -advance diet post-op   Anticipated discharge date: 8/25  Family Communication: Sister at the bedside 8/23   DVT Prophylaxis: SCDs Start: 05/13/22 0329    Author: Ezekiel Slocumb , DO 05/16/2022 3:54 PM  To reach On-call, see care teams to locate the attending and reach out via [email or web address]. Between 7PM-7AM, please contact night-coverage If you still have difficulty reaching the attending provider, please page the [facility] (Director on Call) for [facility] on amion for assistance.

## 2022-05-16 NOTE — Plan of Care (Signed)

## 2022-05-16 NOTE — Anesthesia Procedure Notes (Signed)
Procedure Name: Intubation Date/Time: 05/16/2022 11:15 AM  Performed by: Cammie Sickle, CRNAPre-anesthesia Checklist: Patient identified, Patient being monitored, Timeout performed, Emergency Drugs available and Suction available Patient Re-evaluated:Patient Re-evaluated prior to induction Oxygen Delivery Method: Circle system utilized Preoxygenation: Pre-oxygenation with 100% oxygen Induction Type: IV induction, Cricoid Pressure applied and Rapid sequence Laryngoscope Size: 3 and McGraph Grade View: Grade I Tube type: Oral Tube size: 7.0 mm Number of attempts: 1 Airway Equipment and Method: Stylet Placement Confirmation: ETT inserted through vocal cords under direct vision, positive ETCO2 and breath sounds checked- equal and bilateral Secured at: 20 cm Tube secured with: Tape Dental Injury: Teeth and Oropharynx as per pre-operative assessment

## 2022-05-16 NOTE — Op Note (Signed)
Preoperative diagnosis: choledocolithiasis  Postoperative diagnosis: chronic cholecystitis  Procedure: Robotic assisted Laparoscopic Cholecystectomy.   Anesthesia: GETA   Surgeon: Benjamine Sprague  Specimen: Gallbladder  Complications: None  EBL: 52m  Wound Classification: Clean Contaminated  Indications: see HPI  Findings: Critical view of safety noted Cystic duct and artery identified, ligated and divided, clips remained intact at end of procedure Adequate hemostasis  Description of procedure:  The patient was placed on the operating table in the supine position. SCDs placed, pre-op abx administered.  General anesthesia was induced and OG tube placed by anesthesia. A time-out was completed verifying correct patient, procedure, site, positioning, and implant(s) and/or special equipment prior to beginning this procedure. The abdomen was prepped and draped in the usual sterile fashion.    Veress needle was placed at the Palmer's point and insufflation was started after confirming a positive saline drop test and no immediate increase in abdominal pressure.  After reaching 15 mm, the Veress needle was removed and a 8 mm port was placed via optiview technique under umbilicus measured 256DJfrom gallbladder.  The abdomen was inspected and no abnormalities or injuries were found.  Under direct vision, ports were placed in the following locations: One 12 mm patient left of the umbilicus, 8cm from the optiviewed port, one 8 mm port placed to the patient right of the umbilical port 8 cm apart.  1 additional 8 mm port placed lateral to the 125mport.  Once ports were placed, The table was placed in the reverse Trendelenburg position with the right side up. The Xi platform was brought into the operative field and docked to the ports successfully.  An endoscope was placed through the umbilical port, fenestrated grasper through the adjacent patient right port, prograsp to the far patient left port, and  then a hook cautery in the left port.  The dome of the gallbladder was grasped with prograsp, passed and retracted over the dome of the liver. Adhesions between the gallbladder and omentum, duodenum and transverse colon were lysed via hook cautery. The infundibulum was grasped with the fenestrated grasper and retracted toward the right lower quadrant. This maneuver exposed Calot's triangle. The very thickened and swollen peritoneum overlying the gallbladder infundibulum was then dissected  and the cystic duct and cystic artery identified.  Critical view of safety with the liver bed clearly visible behind the duct and artery with no additional structures noted.  The cystic duct clipped and divided close to the gallbladder. Additional dissection around the artery noted a very small artery, so this was cauterized with the fenestrated.    The gallbladder was then dissected from its peritoneal and liver bed attachments by electrocautery. Hemostasis was checked prior to removing the hook cautery and the Endo Catch bag was then placed through the 12 mm port and the gallbladder was removed.  The gallbladder was passed off the table as a specimen. There was no evidence of bleeding from the gallbladder fossa or cystic artery or leakage of the bile from the cystic duct stump. The 12 mm port site closed with PMI using 0 vicryl under direct vision.  Abdomen desufflated and secondary trocars were removed under direct vision. No bleeding was noted. All skin incisions then closed with subcuticular sutures of 4-0 monocryl and dressed with topical skin adhesive. The orogastric tube was removed and patient extubated.  The patient tolerated the procedure well and was taken to the postanesthesia care unit in stable condition.  All sponge and instrument count correct at end  of procedure.

## 2022-05-16 NOTE — Transfer of Care (Signed)
Immediate Anesthesia Transfer of Care Note  Patient: Kelly Bishop  Procedure(s) Performed: XI ROBOTIC ASSISTED LAPAROSCOPIC CHOLECYSTECTOMY INDOCYANINE GREEN FLUORESCENCE IMAGING (ICG)  Patient Location: PACU  Anesthesia Type:General  Level of Consciousness: drowsy  Airway & Oxygen Therapy: Patient Spontanous Breathing and Patient connected to face mask oxygen  Post-op Assessment: Report given to RN and Post -op Vital signs reviewed and stable  Post vital signs: Reviewed and stable  Last Vitals:  Vitals Value Taken Time  BP 150/67 05/16/22 1228  Temp 35.9 1228  Pulse 75 05/16/22 1231  Resp 23 05/16/22 1231  SpO2 100 % 05/16/22 1231  Vitals shown include unvalidated device data.  Last Pain:  Vitals:   05/16/22 1031  TempSrc: Tympanic  PainSc:          Complications: No notable events documented.

## 2022-05-16 NOTE — Progress Notes (Signed)
Pharmacy Antibiotic Note  Kelly Bishop is a 88 y.o. female admitted on 05/13/2022 with UTI. PMH significant for PAF (on amio and warfarin), depression, hypothyroidism, PAD, chronic spine infection (on ciprofloxacin PTA), Zenker's diverticulum on Reglan. Patient has acute pyelonephritis 2/2 obstructing stones seen on CTAP and MRCP. Patient underwent ERCP with stone removal on 05/14/22. Pharmacy has been consulted for Cefepime dosing.  Plan: Day 4 of antibiotics.  Continue cefepime 2 g IV Q12H.  Patient is also taking Flagyl 500 mg Q12H.  Continue to monitor renal function while on cefepime.  Height: '5\' 3"'$  (160 cm) Weight: 64.9 kg (143 lb) IBW/kg (Calculated) : 52.4  Temp (24hrs), Avg:98.5 F (36.9 C), Min:98.3 F (36.8 C), Max:98.7 F (37.1 C)  Recent Labs  Lab 05/12/22 2119 05/13/22 1700 05/14/22 0550 05/16/22 0555  WBC 3.5*  --  3.3* 3.8*  CREATININE 1.16* 1.06* 1.15* 1.04*     Estimated Creatinine Clearance: 33.9 mL/min (A) (by C-G formula based on SCr of 1.04 mg/dL (H)).    Allergies  Allergen Reactions   Ace Inhibitors Cough   Beta Adrenergic Blockers Other (See Comments)   Morphine And Related Nausea And Vomiting   Peanut-Containing Drug Products Hives   Codeine Nausea Only and Rash    Antimicrobials this admission: 8/21 Zosyn x1 8/21 Flagyl >>  8/21 Cefepime >>  Dose adjustments this admission: N/A  Microbiology results: None ordered  Thank you for allowing pharmacy to be a part of this patient's care.  Gretel Acre, PharmD PGY1 Pharmacy Resident 05/16/2022 7:25 AM

## 2022-05-17 DIAGNOSIS — N132 Hydronephrosis with renal and ureteral calculous obstruction: Secondary | ICD-10-CM | POA: Diagnosis not present

## 2022-05-17 DIAGNOSIS — K8070 Calculus of gallbladder and bile duct without cholecystitis without obstruction: Secondary | ICD-10-CM | POA: Diagnosis not present

## 2022-05-17 DIAGNOSIS — K8050 Calculus of bile duct without cholangitis or cholecystitis without obstruction: Secondary | ICD-10-CM | POA: Diagnosis not present

## 2022-05-17 DIAGNOSIS — E876 Hypokalemia: Secondary | ICD-10-CM | POA: Diagnosis not present

## 2022-05-17 LAB — CBC
HCT: 35.3 % — ABNORMAL LOW (ref 36.0–46.0)
Hemoglobin: 11.4 g/dL — ABNORMAL LOW (ref 12.0–15.0)
MCH: 29.4 pg (ref 26.0–34.0)
MCHC: 32.3 g/dL (ref 30.0–36.0)
MCV: 91 fL (ref 80.0–100.0)
Platelets: 164 10*3/uL (ref 150–400)
RBC: 3.88 MIL/uL (ref 3.87–5.11)
RDW: 13.1 % (ref 11.5–15.5)
WBC: 4.5 10*3/uL (ref 4.0–10.5)
nRBC: 0 % (ref 0.0–0.2)

## 2022-05-17 LAB — BASIC METABOLIC PANEL
Anion gap: 12 (ref 5–15)
BUN: 13 mg/dL (ref 8–23)
CO2: 25 mmol/L (ref 22–32)
Calcium: 8.3 mg/dL — ABNORMAL LOW (ref 8.9–10.3)
Chloride: 103 mmol/L (ref 98–111)
Creatinine, Ser: 1.05 mg/dL — ABNORMAL HIGH (ref 0.44–1.00)
GFR, Estimated: 51 mL/min — ABNORMAL LOW (ref >60)
Glucose, Bld: 106 mg/dL — ABNORMAL HIGH (ref 70–99)
Potassium: 3.4 mmol/L — ABNORMAL LOW (ref 3.5–5.1)
Sodium: 140 mmol/L (ref 135–145)

## 2022-05-17 LAB — SURGICAL PATHOLOGY

## 2022-05-17 LAB — MAGNESIUM: Magnesium: 1.9 mg/dL (ref 1.7–2.4)

## 2022-05-17 MED ORDER — TAMSULOSIN HCL 0.4 MG PO CAPS
0.4000 mg | ORAL_CAPSULE | Freq: Every day | ORAL | 0 refills | Status: DC
Start: 2022-05-18 — End: 2023-08-19

## 2022-05-17 MED ORDER — ONDANSETRON HCL 4 MG PO TABS: 4.0000 mg | ORAL_TABLET | Freq: Three times a day (TID) | ORAL | 0 refills | Status: DC | PRN

## 2022-05-17 MED ORDER — POTASSIUM CHLORIDE CRYS ER 20 MEQ PO TBCR
40.0000 meq | EXTENDED_RELEASE_TABLET | ORAL | Status: AC
  Administered 2022-05-17 (×2): 40 meq via ORAL
  Filled 2022-05-17 (×2): qty 2

## 2022-05-17 MED ORDER — TRAMADOL HCL 50 MG PO TABS: 50.0000 mg | ORAL_TABLET | Freq: Four times a day (QID) | ORAL | 0 refills | Status: AC | PRN

## 2022-05-17 MED ORDER — ACETAMINOPHEN 325 MG PO TABS: 650.0000 mg | ORAL_TABLET | Freq: Three times a day (TID) | ORAL | 0 refills | Status: AC | PRN

## 2022-05-17 NOTE — Care Management Important Message (Signed)
Important Message  Patient Details  Name: Kelly Bishop MRN: [id number] Date of Birth: Dec 27, 1931   Medicare Important Message Given:  Yes     Juliann Pulse A Mashelle Busick 05/17/2022, 11:42 AM

## 2022-05-17 NOTE — Progress Notes (Signed)
Subjective:  CC: Kelly Bishop is a 88 y.o. female  Hospital stay day 4, 1 Day Post-Op robotic lap chole  HPI: No acute issues overnight.  ROS:  General: Denies weight loss, weight gain, fatigue, fevers, chills, and night sweats. Heart: Denies chest pain, palpitations, racing heart, irregular heartbeat, leg pain or swelling, and decreased activity tolerance. Respiratory: Denies breathing difficulty, shortness of breath, wheezing, cough, and sputum. GI: Denies change in appetite, heartburn, nausea, vomiting, constipation, diarrhea, and blood in stool. GU: Denies difficulty urinating, pain with urinating, urgency, frequency, blood in urine.   Objective:   Temp:  [97.2 F (36.2 C)-98.6 F (37 C)] 98.6 F (37 C) (08/25 0450) Pulse Rate:  [59-78] 60 (08/25 0450) Resp:  [11-23] 16 (08/25 0450) BP: (139-177)/(48-84) 141/61 (08/25 0450) SpO2:  [90 %-100 %] 94 % (08/25 0450)     Height: '5\' 3"'$  (160 cm) Weight: 64.9 kg BMI (Calculated): 25.34   Intake/Output this shift:   Intake/Output Summary (Last 24 hours) at 05/17/2022 0742 Last data filed at 05/17/2022 2706 Gross per 24 hour  Intake 900 ml  Output 1705 ml  Net -805 ml    Constitutional :  alert, cooperative, and appears stated age  Respiratory:  clear to auscultation bilaterally  Cardiovascular:  regular rate and rhythm  Gastrointestinal: soft, non-tender; bowel sounds normal; no masses,  no organomegaly.   Skin: Cool and moist. Incisions c/d/i  Psychiatric: Normal affect, non-agitated, not confused       LABS:     Latest Ref Rng & Units 05/17/2022    4:16 AM 05/16/2022    5:55 AM 05/14/2022    5:50 AM  CMP  Glucose 70 - 99 mg/dL 106  77  87   BUN 8 - 23 mg/dL '13  12  10   '$ Creatinine 0.44 - 1.00 mg/dL 1.05  1.04  1.15   Sodium 135 - 145 mmol/L 140  140  142   Potassium 3.5 - 5.1 mmol/L 3.4  3.1  3.4   Chloride 98 - 111 mmol/L 103  108  111   CO2 22 - 32 mmol/L '25  26  26   '$ Calcium 8.9 - 10.3 mg/dL 8.3  8.4  8.4    Total Protein 6.5 - 8.1 g/dL  5.2  5.5   Total Bilirubin 0.3 - 1.2 mg/dL  0.7  0.9   Alkaline Phos 38 - 126 U/L  41  41   AST 15 - 41 U/L  26  23   ALT 0 - 44 U/L  14  11       Latest Ref Rng & Units 05/17/2022    4:16 AM 05/16/2022    5:55 AM 05/14/2022    5:50 AM  CBC  WBC 4.0 - 10.5 K/uL 4.5  3.8  3.3   Hemoglobin 12.0 - 15.0 g/dL 11.4  10.9  11.0   Hematocrit 36.0 - 46.0 % 35.3  33.3  34.2   Platelets 150 - 400 K/uL 164  150  149     RADS: N/a Assessment:   S/p robotic lap chole for choledocolithiasis. Doing well.  Advance to regular and ok to d/c once tolerating.  labs/images/medications/previous chart entries reviewed personally and relevant changes/updates noted above.

## 2022-05-17 NOTE — Discharge Instructions (Addendum)
Laparoscopic Cholecystectomy, Care After This sheet gives you information about how to care for yourself after your procedure. Your doctor may also give you more specific instructions. If you have problems or questions, contact your doctor. Follow these instructions at home: Care for cuts from surgery (incisions)  Follow instructions from your doctor about how to take care of your cuts from surgery. Make sure you: Wash your hands with soap and water before you change your bandage (dressing). If you cannot use soap and water, use hand sanitizer. Change your bandage as told by your doctor. Leave stitches (sutures), skin glue, or skin tape (adhesive) strips in place. They may need to stay in place for 2 weeks or longer. If tape strips get loose and curl up, you may trim the loose edges. Do not remove tape strips completely unless your doctor says it is okay. Do not take baths, swim, or use a hot tub until your doctor says it is okay. OK TO SHOWER 24HRS AFTER YOUR SURGERY.  Check your surgical cut area every day for signs of infection. Check for: More redness, swelling, or pain. More fluid or blood. Warmth. Pus or a bad smell. Activity Do not drive or use heavy machinery while taking prescription pain medicine. Do not play contact sports until your doctor says it is okay. Do not drive for 24 hours if you were given a medicine to help you relax (sedative). Rest as needed. Do not return to work or school until your doctor says it is okay. General instructions  tylenol as needed for discomfort.    Use narcotics, if prescribed, only when tylenol and motrin is not enough to control pain.  325-'650mg'$  every 8hrs to max of '3000mg'$ /24hrs (including the '325mg'$  in every norco dose) for the tylenol.     To prevent or treat constipation while you are taking prescription pain medicine, your doctor may recommend that you: Drink enough fluid to keep your pee (urine) clear or pale yellow. Take over-the-counter or  prescription medicines. Eat foods that are high in fiber, such as fresh fruits and vegetables, whole grains, and beans. Limit foods that are high in fat and processed sugars, such as fried and sweet foods. Contact a doctor if: You develop a rash. You have more redness, swelling, or pain around your surgical cuts. You have more fluid or blood coming from your surgical cuts. Your surgical cuts feel warm to the touch. You have pus or a bad smell coming from your surgical cuts. You have a fever. One or more of your surgical cuts breaks open. You have trouble breathing. You have chest pain. You have pain that is getting worse in your shoulders. You faint or feel dizzy when you stand. You have very bad pain in your belly (abdomen). You are sick to your stomach (nauseous) for more than one day. You have throwing up (vomiting) that lasts for more than one day. You have leg pain. This information is not intended to replace advice given to you by your health care provider. Make sure you discuss any questions you have with your health care provider. Document Released: 06/18/2008 Document Revised: 03/30/2016 Document Reviewed: 02/26/2016 Elsevier Interactive Patient Education  2019 [employer].

## 2022-05-17 NOTE — Progress Notes (Signed)
Pt A./ox4 upon review of AVS. PIV removed. Assisted patient with getting dressed. Family picking patient up. Will escort to entrance when ready. Pt aware of f/u with Dr. Sabra Heck on aug 30 at 2pm. No further needs.

## 2022-05-17 NOTE — Evaluation (Signed)
Physical Therapy Evaluation Patient Details Name: Kelly Bishop MRN: [id number] DOB: 09-30-1931 Today's Date: 05/17/2022  History of Present Illness  Pt is an 88 y.o. female presenting to hospital 8/20 with c/o abdominal pain (R upper and lower quadrant) x1 week; occasional associated nausea and hematuria.  Pt admitted with hydronephrosis with R obstructing calculus, acute pyelonephritis, cholelithiasis with choledocholithiasis, and multicystic head of pancreas lesion.  S/p ERCP 8/22.  Pt s/p robotic assisted laparoscopic cholecystectomy 05/16/22 d/t chronic cholecystitis.  PMH includes anemia, a-fib, basal cell and squamous cell carcinoma, R breast CA s/p lumpectomy, COPD,  B TKR, B RCR, CKD, chronic spine infection, Zenker's diverticulum.  Clinical Impression  Prior to hospital admission, pt was modified independent with functional mobility (held onto furniture as needed within home and used [facility] outside); lives alone in 1 level home with 3 STE with railing; family lives nearby that can assist as needed.  Currently pt is SBA with transfers; CGA to SBA ambulating with SPC and RW; and SBA navigating 4 steps with railing.  Pt demonstrating occasional altered stepping pattern ambulating with SPC but pt able to self correct; pt steady ambulating with RW.  Discussed recommendation to use RW but pt reporting she felt safer with SPC use and could hold onto furniture as needed when ambulating within home (d/t this, therapist encouraged pt to utilize RW when ambulating in spaces where she could not safely hold onto sturdy objects).  Pt would benefit from skilled PT to address noted impairments and functional limitations (see below for any additional details).  Upon hospital discharge, pt would benefit from [place].    Recommendations for follow up therapy are one component of a multi-disciplinary discharge planning process, led by the attending physician.  Recommendations may be updated based on patient status,  additional functional criteria and insurance authorization.  Follow Up Recommendations Home health PT      Assistance Recommended at Discharge PRN  Patient can return home with the following  A little help with walking and/or transfers;A little help with bathing/dressing/bathroom;Assistance with cooking/housework;Assist for transportation;Help with stairs or ramp for entrance    Equipment Recommendations  (pt has RW and SPC at home already)  Recommendations for Other Services       Functional Status Assessment Patient has had a recent decline in their functional status and demonstrates the ability to make significant improvements in function in a reasonable and predictable amount of time.     Precautions / Restrictions Precautions Precautions: Fall Restrictions Weight Bearing Restrictions: No      Mobility  Bed Mobility Overal bed mobility: Modified Independent Bed Mobility: Sit to Supine       Sit to supine: Modified independent (Device/Increase time)   General bed mobility comments: no difficulties noted    Transfers Overall transfer level: Needs assistance Equipment used: None Transfers: Sit to/from Stand Sit to Stand: Supervision           General transfer comment: fairly strong stand from bed x1 trial, from mat table x1 trial, and from toilet x1 trial    Ambulation/Gait Ambulation/Gait assistance: Supervision, Min guard Gait Distance (Feet):  (160 feet x2 with SPC; 60 feet with RW) Assistive device: Straight cane, Rolling walker (2 wheels)   Gait velocity: mildly decreased     General Gait Details: pt with occasional altered stepping pattern ambulating with SPC but pt able to self correct; steady ambulating with RW  Stairs Stairs: Yes Stairs assistance: Supervision Stair Management: One rail Left, Alternating pattern, Step to  pattern, Forwards Number of Stairs: 4 General stair comments: alternating pattern ascending and step to pattern descending;  steady  Wheelchair Mobility    Modified Rankin (Stroke Patients Only)       Balance Overall balance assessment: Needs assistance Sitting-balance support: No upper extremity supported, Feet supported Sitting balance-Leahy Scale: Normal Sitting balance - Comments: steady sitting reaching outside BOS   Standing balance support: During functional activity, Reliant on assistive device for balance Standing balance-Leahy Scale: Good Standing balance comment: occasional altered stepping pattern ambulating with SPC but pt able to self correct                             Pertinent Vitals/Pain Pain Assessment Pain Assessment: No/denies pain Vitals (HR and O2 on room air) stable and WFL throughout treatment session.    Home Living Family/patient expects to be discharged to:: Private residence Living Arrangements: Alone Available Help at Discharge: Family;Available PRN/intermittently Type of Home: House Home Access: Stairs to enter Entrance Stairs-Rails: Left Entrance Stairs-Number of Steps: 3   Home Layout: One level Home Equipment: Cane - single point;[place]ower [place] (2 wheels) Additional Comments: Nephew (who is an EMT) lives next door and can assist as needed; pt's sister lives about 1 mile away and can assist as well    Prior Function Prior Level of Function : Independent/Modified Independent             Mobility Comments: 1 fall at dept store in last month.  Has dog.  Ambulates with SPC outside (no AD use in home but will hold onto furniture as needed for balance)       Hand Dominance        Extremity/Trunk Assessment   Upper Extremity Assessment Upper Extremity Assessment: Overall WFL for tasks assessed    Lower Extremity Assessment Lower Extremity Assessment: Generalized weakness    Cervical / Trunk Assessment Cervical / Trunk Assessment: Normal  Communication   Communication: HOH (wears hearing aids but does not have them in  hospital)  Cognition Arousal/Alertness: Awake/alert Behavior During Therapy: WFL for tasks assessed/performed Overall Cognitive Status: Within Functional Limits for tasks assessed                                          General Comments  Nursing cleared pt for participation in physical therapy.  Pt agreeable to PT session.    Exercises  Gait training   Assessment/Plan    PT Assessment Patient needs continued PT services  PT Problem List Decreased strength;Decreased balance;Decreased mobility;Decreased knowledge of use of DME;Decreased knowledge of precautions       PT Treatment Interventions DME instruction;Gait training;Stair training;Functional mobility training;Therapeutic activities;Therapeutic exercise;Balance training;Patient/family education    PT Goals (Current goals can be found in the Care Plan section)  Acute Rehab PT Goals Patient Stated Goal: to go home today PT Goal Formulation: With patient Time For Goal Achievement: 05/31/22 Potential to Achieve Goals: Good    Frequency Min 2X/week     Co-evaluation               AM-PAC PT "6 Clicks" Mobility  Outcome Measure Help needed turning from your back to your side while in a flat bed without using bedrails?: None Help needed moving from lying on your back to sitting on the side of a flat bed without using bedrails?:  None Help needed moving to and from a bed to a chair (including a wheelchair)?: A Little Help needed standing up from a chair using your arms (e.g., wheelchair or bedside chair)?: A Little Help needed to walk in hospital room?: A Little Help needed climbing 3-5 steps with a railing? : A Little 6 Click Score: 20    End of Session Equipment Utilized During Treatment: Gait belt Activity Tolerance: Patient tolerated treatment well Patient left: in bed;with call bell/phone within reach;with bed alarm set Nurse Communication: Mobility status;Precautions PT Visit Diagnosis:  Unsteadiness on feet (R26.81);Muscle weakness (generalized) (M62.81);History of falling (Z91.81)    Time: 4315-4008 PT Time Calculation (min) (ACUTE ONLY): 34 min   Charges:   PT Evaluation $PT Eval Low Complexity: 1 Low PT Treatments $Gait Training: 8-22 mins       Leitha Bleak, PT 05/17/22, 12:10 PM

## 2022-05-17 NOTE — Discharge Summary (Addendum)
Physician Discharge Summary   Patient: Kelly Bishop MRN: [id number] DOB: 04-26-1932  Admit date:     05/13/2022  Discharge date: 05/17/2022  Discharge Physician: Ezekiel Slocumb   PCP: [name], MD   Recommendations at discharge:    Follow up with general surgery Follow up with primary care Follow up with GI as needed Repeat CMP, CBC, Mg in 1-2 weeks  Discharge Diagnoses: Active Problems:   Hydronephrosis with obstructing calculus, right   Acute pyelonephritis   Cholelithiasis with choledocholithiasis   Cystic mass of pancreas   Atrial fibrillation ([name])   Benign essential hypertension   Stage 3a chronic kidney disease ([name])   COPD (chronic obstructive pulmonary disease) ([name])   PAD (peripheral artery disease) ([place])   Long term (current) use of antibiotics  Resolved Problems:   Calculus of bile duct without cholecystitis and without obstruction   Hypokalemia  Hospital Course: 88 year old female with past medical history of stage IIIa chronic kidney disease paroxysmal atrial fibrillation, chronic spine infection on daily ofloxacin and hypothyroidism who presented to the emergency room on the night of 8/20 with worsening right lower quadrant pain and intermittent rectal bleeding.  Symptoms have been going on for the past 5 to 6 days and patient had seen her PCP on 8/16.  CT scan done at that time noted right hydroureteronephrosis with a 3 mm mid to distal obstructing stone.  An MRI of the abdomen and pelvis done in the emergency department noted same mild right hydronephrosis, but now with moderate perinephric edema and hydronephrosis extending into the right hemipelvis suggesting distal migration of previously seen stone.  Patient was admitted to the hospitalist service and put on IV fluids and antibiotics plus pain control.  Urology consulted and felt no intervention needed and with fluids, stone should pass on its own.    GI consulted and patient underwent ERCP with stone  removal on 05/14/22.     Assessment and Plan: Acute on chronic cholecystitis with cholelithiasis is a clinically significant diagnosis - see below.  Hydronephrosis with obstructing calculus, right Secondary to transient obstructing stone.  Seen by urology.  Started on cefepime plus Flomax and IV fluids.  Urology feels stone should pass on its own.   Acute pyelonephritis Secondary to obstructing ureteral stone.   Treated with cefepime.  Antibiotics completed.  Cholelithiasis with choledocholithiasis As outlined  Calculus of bile duct without cholecystitis and without obstruction-resolved as of 05/28/2022 Underwent ERCP on 8/22 by Dr. Allen Norris. Hold warfarin until 3 days post-procedure. General surgery consulted. Cholecystectomy done on 8/24. Resumed and tolerating diet post-operatively Treated with IV Cefepime, Flagyl.  Cystic mass of pancreas Appears to be incidental finding.   Suspected cystadenoma per radiology.  Further evaluation per GI  Atrial fibrillation ([name]) Continue amiodarone Warfarin was held pre-ERCP and peri-operatively.  Warfarin may resumed tomorrow evening 8/26. Close follow up with INR check and dosing.   Benign essential hypertension Not on home antihypertensives other than Lasix daily PRN.   As needed hydralazine.  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  PAD (peripheral artery disease) ([name]) Resume aspirin  COPD (chronic obstructive pulmonary disease) ([name]) DuoNebs as needed  Stage 3a chronic kidney disease ([place]) Renal function at baseline.   Monitor BMP.  Hypokalemia-resolved as of 05/28/2022 Replacing further for K 3.1 this AM. Replaced with IV fluid additive while NPO, now tolerating PO.   --Repeat labs in 1  weeks         Consultants: GI, general surgery Procedures performed: ERCP, cholecystectomy  Disposition: Home  Diet recommendation:   Discharge Diet Orders (From admission, onward)     Start     Ordered   05/17/22 0000  Diet - low sodium heart healthy        05/17/22 1118           Cardiac diet DISCHARGE MEDICATION: Allergies as of 05/17/2022       Reactions   Ace Inhibitors Cough   Beta Adrenergic Blockers Other (See Comments)   Morphine And Related Nausea And Vomiting   Peanut-containing Drug Products Hives   Codeine Nausea Only, Rash        Medication List     STOP taking these medications    amoxicillin 500 MG capsule Commonly known as: AMOXIL   anastrozole 1 MG tablet Commonly known as: ARIMIDEX   cefUROXime 250 MG tablet Commonly known as: CEFTIN   denosumab 60 MG/ML Soln injection Commonly known as: PROLIA   metroNIDAZOLE 250 MG tablet Commonly known as: FLAGYL   mometasone 0.1 % cream Commonly known as: ELOCON   mupirocin ointment 2 % Commonly known as: BACTROBAN   ondansetron 4 MG disintegrating tablet Commonly known as: Zofran ODT   potassium chloride 10 MEQ tablet Commonly known as: KLOR-CON       TAKE these medications    acetaminophen 325 MG tablet Commonly known as: Tylenol Take 2 tablets (650 mg total) by mouth every 8 (eight) hours as needed for mild pain.   ALPRAZolam 0.5 MG tablet Commonly known as: XANAX Take 0.25 mg by mouth at bedtime.   amiodarone 200 MG tablet Commonly known as: PACERONE Take 300 mg by mouth daily.   aspirin 81 MG chewable tablet Chew 81 mg by mouth daily.   bisacodyl 5 MG EC tablet Commonly known as: DULCOLAX Take 10 mg by mouth at bedtime.   Calcium 600+D Plus Minerals 600-400 MG-UNIT Chew Chew 1 Dose by mouth daily.   Cholecalciferol 50 MCG (2000 UT) Caps Take 1 capsule by mouth daily.   ciprofloxacin 500 MG tablet Commonly known as: CIPRO Take 500 mg by mouth 2 (two) times daily.   Cyanocobalamin 1000 MCG Tbcr Take 1,000 mcg by mouth daily.   docusate sodium 100 MG capsule Commonly known as: Colace Take 1  capsule (100 mg total) by mouth daily as needed for mild constipation.   fexofenadine 180 MG tablet Commonly known as: ALLEGRA Take 180 mg by mouth daily.   furosemide 40 MG tablet Commonly known as: LASIX Take 40 mg by mouth daily as needed.   levothyroxine 50 MCG tablet Commonly known as: SYNTHROID Take 50 mcg by mouth every morning.   ondansetron 4 MG tablet Commonly known as: Zofran Take 1 tablet (4 mg total) by mouth every 8 (eight) hours as needed for nausea or vomiting.   tamsulosin 0.4 MG Caps capsule Commonly known as: FLOMAX Take 1 capsule (0.4 mg total) by mouth daily.   traMADol 50 MG tablet Commonly known as: Ultram Take 1 tablet (50 mg total) by mouth every 6 (six) hours as needed.   venlafaxine XR 37.5 MG 24 hr capsule Commonly known as: EFFEXOR-XR Take 37.5 mg by mouth daily.   warfarin 5 MG tablet Commonly known as: COUMADIN Take 5 mg by mouth daily at 6 PM. Sunday/Tuesday/Thursday/Saturday   warfarin 6 MG tablet Commonly known as: COUMADIN Take 7 mg by mouth daily at 6 PM. Monday.Wednesday/friday  Follow-up Information     Lysle Pearl, Isami, DO Follow up in 2 week(s).   Specialty: Surgery Why: post op lap chole Contact information: [place] [address] [phone]         [name], MD. Go on 05/22/2022.   Specialty: Internal Medicine Why: '@2'$ :30 Contact information: [facility] [place] [address] [phone]         Billey Co, MD. Call.   Specialty: Urology Contact information: [place] [address] [phone]                Discharge Exam: Danley Danker Weights   05/12/22 2112  Weight: 64.9 kg   General exam: awake, alert, no acute distress HEENT: atraumatic, clear conjunctiva, anicteric sclera, moist mucus membranes, hearing grossly normal  Respiratory system: CTAB, no wheezes, rales or rhonchi, normal respiratory  effort. Cardiovascular system: normal S1/S2, RRR, no JVD, murmurs, rubs, gallops, no pedal edema.   Gastrointestinal system: soft, NT, ND, no HSM felt, +bowel sounds. Central nervous system: A&O x3. no gross focal neurologic deficits, normal speech Extremities: moves all , no edema, normal tone Skin: dry, intact, normal temperature Psychiatry: normal mood, congruent affect, judgement and insight appear normal   Condition at discharge: stable  The results of significant diagnostics from this hospitalization (including imaging, microbiology, ancillary and laboratory) are listed below for reference.   Imaging Studies: DG C-Arm 1-60 Min-No Report  Result Date: 05/14/2022 Fluoroscopy was utilized by the requesting physician.  No radiographic interpretation.   MR ABDOMEN MRCP WO CONTRAST  Result Date: 05/13/2022 CLINICAL DATA:  Right flank pain, cholelithiasis, nephrolithiasis EXAM: MRI ABDOMEN WITHOUT CONTRAST  (INCLUDING MRCP) TECHNIQUE: Multiplanar multisequence MR imaging of the abdomen was performed. Heavily T2-weighted images of the biliary and pancreatic ducts were obtained, and three-dimensional MRCP images were rendered by post processing. COMPARISON:  CT 05/09/2022 FINDINGS: Lower chest: Cardiac size is mildly enlarged. Hepatobiliary: Cholelithiasis without pericholecystic inflammatory change. Choledocholithiasis again noted with 4 x 7 mm calculus noted within the distal common duct. The common duct is not dilated, measuring up to 7 mm in diameter. No intrahepatic biliary ductal dilation. Liver unremarkable. Pancreas: Complete pancreatic divisum; the dorsal duct drains to the minor papilla, best seen on image # 17, series 3. A 10 mm x 11 mm multi cystic lesion is seen within the head of the pancreas at axial image # 19, series 4 demonstrating no significant soft tissue component multiple thin internal septa. While not optimally assessed in absence of contrast administration, this is most  suggestive of a serous cystadenoma. No peripancreatic inflammatory change. The pancreatic duct is not dilated. Spleen:  Within normal limits in size and appearance. Adrenals/Urinary Tract: The adrenal glands are unremarkable. The kidneys are normal in size and position. Multiple peripelvic cysts are seen on the left. On the right, mild right hydronephrosis is again identified and there is now moderate perinephric edema. Hydronephrosis now extends into the right hemipelvis, best seen on image # 13, series 3 suggesting distal migration of the previously noted obstructing calculus though the calculus itself is not well visualized on this examination. Stomach/Bowel: Visualized portions within the abdomen are unremarkable. Vascular/Lymphatic: No pathologically enlarged lymph nodes identified. No abdominal aortic aneurysm demonstrated. Other: Tiny fat containing umbilical hernia noted. Mild subcutaneous edema within the lumbar soft tissues. Musculoskeletal: There is artifact related to extensive thoracolumbar fusion with instrumentation this limits evaluation of the osseous structures IMPRESSION: 1. Cholelithiasis and choledocholithiasis. No  intrahepatic or extrahepatic biliary ductal dilation. 2. Complete pancreatic divisum. 3. Multi cystic lesion within the head of the pancreas measuring 10 x 11 mm. While not optimally assessed in absence of contrast administration, this is most suggestive of a serous cystadenoma. 4. Mild right hydronephrosis is again identified and there is now moderate perinephric edema. Hydronephrosis now extends into the right hemipelvis, suggesting distal migration of the previously noted obstructing calculus though the calculus itself is not well visualized on this examination. Electronically Signed   By: Fidela Salisbury M.D.   On: 05/13/2022 02:07   MR 3D Recon At Scanner  Result Date: 05/13/2022 CLINICAL DATA:  Right flank pain, cholelithiasis, nephrolithiasis EXAM: MRI ABDOMEN WITHOUT  CONTRAST  (INCLUDING MRCP) TECHNIQUE: Multiplanar multisequence MR imaging of the abdomen was performed. Heavily T2-weighted images of the biliary and pancreatic ducts were obtained, and three-dimensional MRCP images were rendered by post processing. COMPARISON:  CT 05/09/2022 FINDINGS: Lower chest: Cardiac size is mildly enlarged. Hepatobiliary: Cholelithiasis without pericholecystic inflammatory change. Choledocholithiasis again noted with 4 x 7 mm calculus noted within the distal common duct. The common duct is not dilated, measuring up to 7 mm in diameter. No intrahepatic biliary ductal dilation. Liver unremarkable. Pancreas: Complete pancreatic divisum; the dorsal duct drains to the minor papilla, best seen on image # 17, series 3. A 10 mm x 11 mm multi cystic lesion is seen within the head of the pancreas at axial image # 19, series 4 demonstrating no significant soft tissue component multiple thin internal septa. While not optimally assessed in absence of contrast administration, this is most suggestive of a serous cystadenoma. No peripancreatic inflammatory change. The pancreatic duct is not dilated. Spleen:  Within normal limits in size and appearance. Adrenals/Urinary Tract: The adrenal glands are unremarkable. The kidneys are normal in size and position. Multiple peripelvic cysts are seen on the left. On the right, mild right hydronephrosis is again identified and there is now moderate perinephric edema. Hydronephrosis now extends into the right hemipelvis, best seen on image # 13, series 3 suggesting distal migration of the previously noted obstructing calculus though the calculus itself is not well visualized on this examination. Stomach/Bowel: Visualized portions within the abdomen are unremarkable. Vascular/Lymphatic: No pathologically enlarged lymph nodes identified. No abdominal aortic aneurysm demonstrated. Other: Tiny fat containing umbilical hernia noted. Mild subcutaneous edema within the lumbar  soft tissues. Musculoskeletal: There is artifact related to extensive thoracolumbar fusion with instrumentation this limits evaluation of the osseous structures IMPRESSION: 1. Cholelithiasis and choledocholithiasis. No intrahepatic or extrahepatic biliary ductal dilation. 2. Complete pancreatic divisum. 3. Multi cystic lesion within the head of the pancreas measuring 10 x 11 mm. While not optimally assessed in absence of contrast administration, this is most suggestive of a serous cystadenoma. 4. Mild right hydronephrosis is again identified and there is now moderate perinephric edema. Hydronephrosis now extends into the right hemipelvis, suggesting distal migration of the previously noted obstructing calculus though the calculus itself is not well visualized on this examination. Electronically Signed   By: Fidela Salisbury M.D.   On: 05/13/2022 02:07   CT ABDOMEN PELVIS W CONTRAST  Result Date: 05/09/2022 CLINICAL DATA:  Right-sided abdominal discomfort/pressure with blood in urine. EXAM: CT ABDOMEN AND PELVIS WITH CONTRAST TECHNIQUE: Multidetector CT imaging of the abdomen and pelvis was performed using the standard protocol following bolus administration of intravenous contrast. RADIATION DOSE REDUCTION: This exam was performed according to the departmental dose-optimization program which includes automated exposure control, adjustment of the mA  and/or kV according to patient size and/or use of iterative reconstruction technique. CONTRAST:  129m OMNIPAQUE IOHEXOL 300 MG/ML  SOLN COMPARISON:  CT pelvis dated March 21, 2011. FINDINGS: Lower chest: No acute abnormality. Hepatobiliary: No suspicious hepatic lesion. Cholelithiasis in a nondilated gallbladder. Thickening of the gallbladder wall is favored to reflect chronic inflammation. Prominence of the biliary tree with the common duct measuring up to 8 mm. Choledocholithiasis in the distal common duct measuring 5 mm. Pancreas: No pancreatic ductal dilation or  evidence of acute inflammation. Spleen: No splenomegaly or focal splenic lesion. Adrenals/Urinary Tract: Bilateral adrenal glands appear normal. Mild right-sided hydroureteronephrosis to the level of a 3 mm stone in the mid/distal ureter near the pelvic inlet on image 49/2. No left-sided hydronephrosis. Additional nonobstructive right lower pole renal stone measures 4 mm. Left-sided renal sinus cysts are considered benign and require no independent imaging follow-up. Urinary bladder is unremarkable for degree of distension. Stomach/Bowel: Radiopaque enteric contrast material traverses the cecum. Stomach is unremarkable for degree of distension. No pathologic dilation of small or large bowel. Moderate volume of formed stool throughout the colon suggestive of constipation. No evidence of acute bowel inflammation. Vascular/Lymphatic: Aortic atherosclerosis. No pathologically enlarged abdominal or pelvic lymph nodes. Numerous pelvic phleboliths. Reproductive: No suspicious mass or acute finding. Other: No significant abdominopelvic free fluid. Musculoskeletal: No acute osseous abnormality. Diffuse demineralization of bone. Posterior lumbosacral fixation hardware. IMPRESSION: 1. Mild right hydroureteronephrosis to a 3 mm stone in the mid/distal ureter near the pelvic inlet. 2. Additional nonobstructive right lower pole renal stone measuring 4 mm. 3. Choledocholithiasis in the distal common duct measuring 5 mm. Prominence of the biliary tree with the common duct measuring 8 mm within normal limits for patient's age but would suggest correlation with laboratory values for biliary obstruction and gastroenterology referral for possible ERCP removal of the stone. 4. Cholelithiasis without findings of acute cholecystitis. However there is wall thickening of a nondistended gallbladder which is favored to reflect sequela of chronic inflammation. 5.  Aortic Atherosclerosis (ICD10-I70.0). Electronically Signed   By: JDahlia Bailiff M.D.   On: 05/09/2022 12:03    Microbiology: Results for orders placed or performed in visit on 06/07/19  Novel Coronavirus, NAA ([facility])     Status: None   Collection Time: 06/07/19 12:27 PM   Specimen: Oropharyngeal(OP) collection in vial transport medium   OROPHARYNGEA  TESTING  Result Value Ref Range Status   SARS-CoV-2, NAA Not Detected Not Detected Final    Comment: Testing was performed using the cobas(R) SARS-CoV-2 test. This nucleic acid amplification test was developed and its performance characteristics determined by [facility] Nucleic acid amplification tests include PCR and TMA. This test has not been FDA cleared or approved. This test has been authorized by FDA under an Emergency Use Authorization (EUA). This test is only authorized for the duration of time the declaration that circumstances exist justifying the authorization of the emergency use of in vitro diagnostic tests for detection of SARS-CoV-2 virus and/or diagnosis of COVID-19 infection under section 564(b)(1) of the Act, 21 U.S.C. 3893YBO-1(B (1), unless the authorization is terminated or revoked sooner. When diagnostic testing is negative, the possibility of a false negative result should be considered in the context of a patient's recent exposures and the presence of clinical signs and symptoms consistent with COVID-19. An individual without symptoms  of COVID-19 and who is not shedding SARS-CoV-2 virus would expect to have a negative (not detected) result in this assay.     Labs:  CBC: No results for input(s): "WBC", "NEUTROABS", "HGB", "HCT", "MCV", "PLT" in the last 168 hours.  Basic Metabolic Panel: No results for input(s): "NA", "K", "CL", "CO2", "GLUCOSE", "BUN", "CREATININE", "CALCIUM", "MG", "PHOS" in the last 168 hours.  Liver Function Tests: No results for input(s): "AST", "ALT", "ALKPHOS", "BILITOT", "PROT", "ALBUMIN" in the last 168 hours.  CBG: No results for input(s):  "GLUCAP" in the last 168 hours.  Discharge time spent: less than 30 minutes.  Signed: Ezekiel Slocumb, DO [facility] 05/28/2022

## 2022-05-22 DIAGNOSIS — I4891 Unspecified atrial fibrillation: Secondary | ICD-10-CM | POA: Diagnosis not present

## 2022-05-22 DIAGNOSIS — N1831 Chronic kidney disease, stage 3a: Secondary | ICD-10-CM | POA: Diagnosis not present

## 2022-05-22 DIAGNOSIS — D136 Benign neoplasm of pancreas: Secondary | ICD-10-CM | POA: Diagnosis not present

## 2022-05-22 DIAGNOSIS — I482 Chronic atrial fibrillation, unspecified: Secondary | ICD-10-CM | POA: Diagnosis not present

## 2022-05-22 DIAGNOSIS — K8050 Calculus of bile duct without cholangitis or cholecystitis without obstruction: Secondary | ICD-10-CM | POA: Diagnosis not present

## 2022-05-28 ENCOUNTER — Encounter: Payer: Self-pay | Admitting: Internal Medicine

## 2022-06-06 ENCOUNTER — Ambulatory Visit
Admission: RE | Admit: 2022-06-06 | Discharge: 2022-06-06 | Disposition: A | Discharge status: Home / Self Care | Payer: PPO | Source: Ambulatory Visit | Attending: Urology | Admitting: Urology

## 2022-06-06 ENCOUNTER — Encounter: Payer: Self-pay | Admitting: Urology

## 2022-06-06 ENCOUNTER — Encounter: Payer: Self-pay | Admitting: Oncology

## 2022-06-06 ENCOUNTER — Ambulatory Visit (INDEPENDENT_AMBULATORY_CARE_PROVIDER_SITE_OTHER): Payer: PPO | Admitting: Urology

## 2022-06-06 VITALS — BP 131/80 | HR 80 | Ht 63.0 in | Wt 143.0 lb

## 2022-06-06 DIAGNOSIS — N200 Calculus of kidney: Secondary | ICD-10-CM

## 2022-06-06 DIAGNOSIS — I878 Other specified disorders of veins: Secondary | ICD-10-CM | POA: Diagnosis not present

## 2022-06-06 DIAGNOSIS — Z9889 Other specified postprocedural states: Secondary | ICD-10-CM | POA: Diagnosis not present

## 2022-06-06 NOTE — Progress Notes (Signed)
06/06/22 11:45 AM   Kelly Bishop 09-Feb-1932 [id number]  CC: Right ureteral stone  HPI: 88 year old female who presented to the ER on 05/12/22 for right-sided abdominal pain.  CT at that time showed a 4 mm right mid ureteral stone with hydronephrosis, urinalysis was relatively benign and not sent for culture, and there was cholelithiasis and choledocholithiasis.  Urology was not consulted at that time.  She ultimately underwent a cholecystectomy with Dr. Lysle Pearl.  She denies any problems since discharge.  She has not had any further abdominal pain or flank pain, denies any urinary symptoms or gross hematuria. She thinks she saw a stone pass in the hospital when she was straining her urine.  No prior history of stones.  PMH: Past Medical History:  Diagnosis Date   Actinic keratosis 05/17/2008   L upper lip - bx proven   Actinic keratosis 10/06/2014   R mid pretibial med - bx proven   Actinic keratosis 11/23/2020   upper back spinal, bx proven   Anemia    Atrial fibrillation ([place])    B12 deficiency    Basal cell carcinoma 08/08/2009   R ant deltoid lat    Basal cell carcinoma 08/08/2009   R ant deltoid med   Basal cell carcinoma 07/31/2010   R mid pretibial   Basal cell carcinoma 10/06/2014   L distal ant thigh   Basal cell carcinoma 10/11/2015   L med distal tricep near elbow   Basal cell carcinoma 02/19/2017   R prox mandible - excision 05/27/2017   Basal cell carcinoma 04/23/2017   L distal pretibial    Basal cell carcinoma 01/25/2020   R forehead above lat brow   Breast cancer ([place]) 2015   Right   Breast cancer, right breast ([name])    right breast Invasive mammary carcinoma grade 1   Calculus of bile duct without cholecystitis and without obstruction    Cataracts, bilateral    COPD (chronic obstructive pulmonary disease) ([place])    Hemorrhoids    Hypokalemia 05/16/2022   Lymphopenia    Osteoarthritis    Osteoporosis July 2015   seen on DEXA scan-T score of -2.9 in  the left neck femur   Pancytopenia ([place])    Personal history of radiation therapy    Skin cancer    Spastic dysphonia    Squamous cell carcinoma of skin 06/22/2007   R mid pretibial   Squamous cell carcinoma of skin 05/17/2008   R pretibial    Squamous cell carcinoma of skin 08/08/2009   R dorsum prox wrist   Squamous cell carcinoma of skin 10/06/2014   L pretibial    Squamous cell carcinoma of skin 12/08/2017   L pretibial    Squamous cell carcinoma of skin 07/26/2019   L mid lat pretibial    Thrombocytopenia ([name])    Unsteady gait    Zenker's diverticulum     Surgical History: Past Surgical History:  Procedure Laterality Date   BREAST EXCISIONAL BIOPSY Left    x 20 years surgical bx   BREAST EXCISIONAL BIOPSY Left 01/24/2016   ultrasound - Papaloma   BREAST LUMPECTOMY Right 2015   BREAST LUMPECTOMY WITH NEEDLE LOCALIZATION Left 03/12/2016   Procedure: BREAST LUMPECTOMY WITH NEEDLE LOCALIZATION;  Surgeon: Leonie Green, MD;  Location: [facility] ORS;  Service: General;  Laterality: Left;   ERCP N/A 05/14/2022   Procedure: ENDOSCOPIC RETROGRADE CHOLANGIOPANCREATOGRAPHY (ERCP);  Surgeon: Lucilla Lame, MD;  Location: [facility] ENDOSCOPY;  Service: Endoscopy;  Laterality: N/A;  HYSTEROSCOPY WITH D & C N/A 07/05/2016   Procedure: DILATATION AND CURETTAGE /HYSTEROSCOPY;  Surgeon: [name], MD;  Location: [facility] ORS;  Service: Gynecology;  Laterality: N/A;   L1-L4 laminectomy and T6 fusion  May 4665   complicated by postoperative infection, more recently had recurrent pseudomonas infection at scar site March 2015   REPLACEMENT TOTAL KNEE Bilateral    ROTATOR CUFF REPAIR Bilateral    ultrasound guided biopsy of breast  Jan 27, 2014     Family History: Family History  Problem Relation Age of Onset   Throat cancer Mother    Prostate cancer Brother    Breast cancer Brother    Leukemia Brother    Lung cancer Father     Social History:  reports that she has never smoked. She has  never been exposed to tobacco smoke. She has never used smokeless tobacco. She reports that she does not drink alcohol and does not use drugs.  Physical Exam: BP 131/80   Pulse 80   Ht '5\' 3"'$  (1.6 m)   Wt 143 lb (64.9 kg)   BMI 25.33 kg/m    Constitutional:  Alert and oriented, No acute distress. Cardiovascular: No clubbing, cyanosis, or edema. Respiratory: Normal respiratory effort, no increased work of breathing. GI: Abdomen is soft, nontender, nondistended, no abdominal masses   Laboratory Data: Reviewed  Pertinent Imaging: I have personally viewed and interpreted the CT scan showing a 4 mm right mid ureteral stone with upstream hydronephrosis in addition to choledocholithiasis.  The KUB today limited by extensive spinal hardware, 32m right lower pole stone unchanged  Assessment & Plan:   88year old female admitted to the hospital recently with right-sided abdominal pain found to have a 4 mm right mid ureteral stone on CT in addition to choledocholithiasis and ultimately underwent cholecystectomy with general surgery. All hospital notes and images reviewed. Urology was never consulted.  She denies any problems since discharge and denies any pain or urinary symptoms. KUB today limited by spinal hardware. She opts for observation and return precautions discussed at length.  She prefers follow up as needed, return precautions discussed extensively. Low threshold to repeat CT if recurrent right flank pain   BNickolas Madrid MD 06/06/2022  [id number] [place] [phone]

## 2022-06-13 DIAGNOSIS — I482 Chronic atrial fibrillation, unspecified: Secondary | ICD-10-CM | POA: Diagnosis not present

## 2022-06-13 DIAGNOSIS — I4891 Unspecified atrial fibrillation: Secondary | ICD-10-CM | POA: Diagnosis not present

## 2022-06-17 DIAGNOSIS — I4891 Unspecified atrial fibrillation: Secondary | ICD-10-CM | POA: Diagnosis not present

## 2022-06-17 DIAGNOSIS — I482 Chronic atrial fibrillation, unspecified: Secondary | ICD-10-CM | POA: Diagnosis not present

## 2022-06-24 DIAGNOSIS — I482 Chronic atrial fibrillation, unspecified: Secondary | ICD-10-CM | POA: Diagnosis not present

## 2022-06-24 DIAGNOSIS — I4891 Unspecified atrial fibrillation: Secondary | ICD-10-CM | POA: Diagnosis not present

## 2022-07-01 DIAGNOSIS — I4891 Unspecified atrial fibrillation: Secondary | ICD-10-CM | POA: Diagnosis not present

## 2022-07-01 DIAGNOSIS — I482 Chronic atrial fibrillation, unspecified: Secondary | ICD-10-CM | POA: Diagnosis not present

## 2022-07-08 DIAGNOSIS — I482 Chronic atrial fibrillation, unspecified: Secondary | ICD-10-CM | POA: Diagnosis not present

## 2022-07-22 DIAGNOSIS — I482 Chronic atrial fibrillation, unspecified: Secondary | ICD-10-CM | POA: Diagnosis not present

## 2022-07-22 DIAGNOSIS — I4891 Unspecified atrial fibrillation: Secondary | ICD-10-CM | POA: Diagnosis not present

## 2022-08-05 DIAGNOSIS — I482 Chronic atrial fibrillation, unspecified: Secondary | ICD-10-CM | POA: Diagnosis not present

## 2022-08-05 DIAGNOSIS — I4891 Unspecified atrial fibrillation: Secondary | ICD-10-CM | POA: Diagnosis not present

## 2022-08-20 DIAGNOSIS — K625 Hemorrhage of anus and rectum: Secondary | ICD-10-CM | POA: Diagnosis not present

## 2022-08-20 DIAGNOSIS — R102 Pelvic and perineal pain: Secondary | ICD-10-CM | POA: Diagnosis not present

## 2022-08-20 DIAGNOSIS — I4891 Unspecified atrial fibrillation: Secondary | ICD-10-CM | POA: Diagnosis not present

## 2022-08-20 DIAGNOSIS — Z7901 Long term (current) use of anticoagulants: Secondary | ICD-10-CM | POA: Diagnosis not present

## 2022-08-20 DIAGNOSIS — I482 Chronic atrial fibrillation, unspecified: Secondary | ICD-10-CM | POA: Diagnosis not present

## 2022-08-21 ENCOUNTER — Other Ambulatory Visit: Payer: Self-pay | Admitting: Gastroenterology

## 2022-08-21 DIAGNOSIS — R102 Pelvic and perineal pain: Secondary | ICD-10-CM

## 2022-08-21 DIAGNOSIS — K625 Hemorrhage of anus and rectum: Secondary | ICD-10-CM

## 2022-08-23 ENCOUNTER — Ambulatory Visit
Admission: RE | Admit: 2022-08-23 | Discharge: 2022-08-23 | Disposition: A | Discharge status: Home / Self Care | Payer: PPO | Source: Ambulatory Visit | Attending: Gastroenterology | Admitting: Gastroenterology

## 2022-08-23 DIAGNOSIS — R102 Pelvic and perineal pain: Secondary | ICD-10-CM | POA: Diagnosis not present

## 2022-08-23 DIAGNOSIS — K625 Hemorrhage of anus and rectum: Secondary | ICD-10-CM

## 2022-09-09 DIAGNOSIS — I482 Chronic atrial fibrillation, unspecified: Secondary | ICD-10-CM | POA: Diagnosis not present

## 2022-09-09 DIAGNOSIS — I4891 Unspecified atrial fibrillation: Secondary | ICD-10-CM | POA: Diagnosis not present

## 2022-10-08 DIAGNOSIS — E782 Mixed hyperlipidemia: Secondary | ICD-10-CM | POA: Diagnosis not present

## 2022-10-08 DIAGNOSIS — I482 Chronic atrial fibrillation, unspecified: Secondary | ICD-10-CM | POA: Diagnosis not present

## 2022-10-08 DIAGNOSIS — I4891 Unspecified atrial fibrillation: Secondary | ICD-10-CM | POA: Diagnosis not present

## 2022-10-08 DIAGNOSIS — E538 Deficiency of other specified B group vitamins: Secondary | ICD-10-CM | POA: Diagnosis not present

## 2022-10-14 DIAGNOSIS — Z23 Encounter for immunization: Secondary | ICD-10-CM | POA: Diagnosis not present

## 2022-10-14 DIAGNOSIS — L97909 Non-pressure chronic ulcer of unspecified part of unspecified lower leg with unspecified severity: Secondary | ICD-10-CM | POA: Diagnosis not present

## 2022-10-14 DIAGNOSIS — F3341 Major depressive disorder, recurrent, in partial remission: Secondary | ICD-10-CM | POA: Diagnosis not present

## 2022-10-14 DIAGNOSIS — I872 Venous insufficiency (chronic) (peripheral): Secondary | ICD-10-CM | POA: Diagnosis not present

## 2022-10-14 DIAGNOSIS — J432 Centrilobular emphysema: Secondary | ICD-10-CM | POA: Diagnosis not present

## 2022-10-14 DIAGNOSIS — I5031 Acute diastolic (congestive) heart failure: Secondary | ICD-10-CM | POA: Diagnosis not present

## 2022-10-14 DIAGNOSIS — I739 Peripheral vascular disease, unspecified: Secondary | ICD-10-CM | POA: Diagnosis not present

## 2022-10-14 DIAGNOSIS — D329 Benign neoplasm of meninges, unspecified: Secondary | ICD-10-CM | POA: Diagnosis not present

## 2022-10-14 DIAGNOSIS — I482 Chronic atrial fibrillation, unspecified: Secondary | ICD-10-CM | POA: Diagnosis not present

## 2022-10-14 DIAGNOSIS — E782 Mixed hyperlipidemia: Secondary | ICD-10-CM | POA: Diagnosis not present

## 2022-10-21 DIAGNOSIS — I83019 Varicose veins of right lower extremity with ulcer of unspecified site: Secondary | ICD-10-CM | POA: Diagnosis not present

## 2022-10-21 DIAGNOSIS — L97919 Non-pressure chronic ulcer of unspecified part of right lower leg with unspecified severity: Secondary | ICD-10-CM | POA: Diagnosis not present

## 2022-10-28 DIAGNOSIS — I83019 Varicose veins of right lower extremity with ulcer of unspecified site: Secondary | ICD-10-CM | POA: Diagnosis not present

## 2022-10-28 DIAGNOSIS — L97919 Non-pressure chronic ulcer of unspecified part of right lower leg with unspecified severity: Secondary | ICD-10-CM | POA: Diagnosis not present

## 2022-10-28 DIAGNOSIS — I482 Chronic atrial fibrillation, unspecified: Secondary | ICD-10-CM | POA: Diagnosis not present

## 2022-10-28 DIAGNOSIS — I4891 Unspecified atrial fibrillation: Secondary | ICD-10-CM | POA: Diagnosis not present

## 2022-11-04 DIAGNOSIS — I4891 Unspecified atrial fibrillation: Secondary | ICD-10-CM | POA: Diagnosis not present

## 2022-11-04 DIAGNOSIS — I83019 Varicose veins of right lower extremity with ulcer of unspecified site: Secondary | ICD-10-CM | POA: Diagnosis not present

## 2022-11-04 DIAGNOSIS — L97919 Non-pressure chronic ulcer of unspecified part of right lower leg with unspecified severity: Secondary | ICD-10-CM | POA: Diagnosis not present

## 2022-11-04 DIAGNOSIS — I482 Chronic atrial fibrillation, unspecified: Secondary | ICD-10-CM | POA: Diagnosis not present

## 2022-11-12 DIAGNOSIS — I4891 Unspecified atrial fibrillation: Secondary | ICD-10-CM | POA: Diagnosis not present

## 2022-11-12 DIAGNOSIS — L97919 Non-pressure chronic ulcer of unspecified part of right lower leg with unspecified severity: Secondary | ICD-10-CM | POA: Diagnosis not present

## 2022-11-12 DIAGNOSIS — I83019 Varicose veins of right lower extremity with ulcer of unspecified site: Secondary | ICD-10-CM | POA: Diagnosis not present

## 2022-11-12 DIAGNOSIS — K649 Unspecified hemorrhoids: Secondary | ICD-10-CM | POA: Diagnosis not present

## 2022-11-12 DIAGNOSIS — I482 Chronic atrial fibrillation, unspecified: Secondary | ICD-10-CM | POA: Diagnosis not present

## 2022-11-26 DIAGNOSIS — I4891 Unspecified atrial fibrillation: Secondary | ICD-10-CM | POA: Diagnosis not present

## 2022-11-26 DIAGNOSIS — H6123 Impacted cerumen, bilateral: Secondary | ICD-10-CM | POA: Diagnosis not present

## 2022-11-26 DIAGNOSIS — H6983 Other specified disorders of Eustachian tube, bilateral: Secondary | ICD-10-CM | POA: Diagnosis not present

## 2022-12-10 DIAGNOSIS — I482 Chronic atrial fibrillation, unspecified: Secondary | ICD-10-CM | POA: Diagnosis not present

## 2022-12-10 DIAGNOSIS — I4891 Unspecified atrial fibrillation: Secondary | ICD-10-CM | POA: Diagnosis not present

## 2023-01-07 DIAGNOSIS — Z7901 Long term (current) use of anticoagulants: Secondary | ICD-10-CM | POA: Diagnosis not present

## 2023-01-07 DIAGNOSIS — I482 Chronic atrial fibrillation, unspecified: Secondary | ICD-10-CM | POA: Diagnosis not present

## 2023-01-13 DIAGNOSIS — H04123 Dry eye syndrome of bilateral lacrimal glands: Secondary | ICD-10-CM | POA: Diagnosis not present

## 2023-01-13 DIAGNOSIS — Z961 Presence of intraocular lens: Secondary | ICD-10-CM | POA: Diagnosis not present

## 2023-02-04 DIAGNOSIS — Z7901 Long term (current) use of anticoagulants: Secondary | ICD-10-CM | POA: Diagnosis not present

## 2023-02-04 DIAGNOSIS — I482 Chronic atrial fibrillation, unspecified: Secondary | ICD-10-CM | POA: Diagnosis not present

## 2023-02-10 DIAGNOSIS — Z7901 Long term (current) use of anticoagulants: Secondary | ICD-10-CM | POA: Diagnosis not present

## 2023-02-10 DIAGNOSIS — N1832 Chronic kidney disease, stage 3b: Secondary | ICD-10-CM | POA: Diagnosis not present

## 2023-02-10 DIAGNOSIS — I480 Paroxysmal atrial fibrillation: Secondary | ICD-10-CM | POA: Diagnosis not present

## 2023-02-10 DIAGNOSIS — R9431 Abnormal electrocardiogram [ECG] [EKG]: Secondary | ICD-10-CM | POA: Diagnosis not present

## 2023-02-10 DIAGNOSIS — I071 Rheumatic tricuspid insufficiency: Secondary | ICD-10-CM | POA: Diagnosis not present

## 2023-02-10 DIAGNOSIS — I517 Cardiomegaly: Secondary | ICD-10-CM | POA: Diagnosis not present

## 2023-02-10 DIAGNOSIS — I340 Nonrheumatic mitral (valve) insufficiency: Secondary | ICD-10-CM | POA: Diagnosis not present

## 2023-02-19 DIAGNOSIS — I480 Paroxysmal atrial fibrillation: Secondary | ICD-10-CM | POA: Diagnosis not present

## 2023-02-25 DIAGNOSIS — I4891 Unspecified atrial fibrillation: Secondary | ICD-10-CM | POA: Diagnosis not present

## 2023-02-25 DIAGNOSIS — Z7901 Long term (current) use of anticoagulants: Secondary | ICD-10-CM | POA: Diagnosis not present

## 2023-02-25 DIAGNOSIS — I482 Chronic atrial fibrillation, unspecified: Secondary | ICD-10-CM | POA: Diagnosis not present

## 2023-03-28 DIAGNOSIS — Z7901 Long term (current) use of anticoagulants: Secondary | ICD-10-CM | POA: Diagnosis not present

## 2023-04-03 ENCOUNTER — Other Ambulatory Visit: Payer: Self-pay | Admitting: Internal Medicine

## 2023-04-03 DIAGNOSIS — Z1231 Encounter for screening mammogram for malignant neoplasm of breast: Secondary | ICD-10-CM

## 2023-04-09 DIAGNOSIS — Z79899 Other long term (current) drug therapy: Secondary | ICD-10-CM | POA: Diagnosis not present

## 2023-04-09 DIAGNOSIS — E782 Mixed hyperlipidemia: Secondary | ICD-10-CM | POA: Diagnosis not present

## 2023-04-09 DIAGNOSIS — I482 Chronic atrial fibrillation, unspecified: Secondary | ICD-10-CM | POA: Diagnosis not present

## 2023-04-16 DIAGNOSIS — Z0000 Encounter for general adult medical examination without abnormal findings: Secondary | ICD-10-CM | POA: Diagnosis not present

## 2023-04-16 DIAGNOSIS — I482 Chronic atrial fibrillation, unspecified: Secondary | ICD-10-CM | POA: Diagnosis not present

## 2023-04-16 DIAGNOSIS — E538 Deficiency of other specified B group vitamins: Secondary | ICD-10-CM | POA: Diagnosis not present

## 2023-04-16 DIAGNOSIS — E782 Mixed hyperlipidemia: Secondary | ICD-10-CM | POA: Diagnosis not present

## 2023-04-16 DIAGNOSIS — I739 Peripheral vascular disease, unspecified: Secondary | ICD-10-CM | POA: Diagnosis not present

## 2023-04-28 ENCOUNTER — Ambulatory Visit
Admission: RE | Admit: 2023-04-28 | Discharge: 2023-04-28 | Disposition: A | Discharge status: Home / Self Care | Payer: PPO | Source: Ambulatory Visit | Attending: Internal Medicine | Admitting: Internal Medicine

## 2023-04-28 DIAGNOSIS — Z1231 Encounter for screening mammogram for malignant neoplasm of breast: Secondary | ICD-10-CM | POA: Insufficient documentation

## 2023-05-06 DIAGNOSIS — Z7901 Long term (current) use of anticoagulants: Secondary | ICD-10-CM | POA: Diagnosis not present

## 2023-05-06 DIAGNOSIS — I482 Chronic atrial fibrillation, unspecified: Secondary | ICD-10-CM | POA: Diagnosis not present

## 2023-06-03 DIAGNOSIS — I482 Chronic atrial fibrillation, unspecified: Secondary | ICD-10-CM | POA: Diagnosis not present

## 2023-06-04 DIAGNOSIS — L97909 Non-pressure chronic ulcer of unspecified part of unspecified lower leg with unspecified severity: Secondary | ICD-10-CM | POA: Diagnosis not present

## 2023-06-04 DIAGNOSIS — I482 Chronic atrial fibrillation, unspecified: Secondary | ICD-10-CM | POA: Diagnosis not present

## 2023-06-04 DIAGNOSIS — R600 Localized edema: Secondary | ICD-10-CM | POA: Diagnosis not present

## 2023-06-04 DIAGNOSIS — I872 Venous insufficiency (chronic) (peripheral): Secondary | ICD-10-CM | POA: Diagnosis not present

## 2023-06-04 DIAGNOSIS — I739 Peripheral vascular disease, unspecified: Secondary | ICD-10-CM | POA: Diagnosis not present

## 2023-06-11 DIAGNOSIS — R600 Localized edema: Secondary | ICD-10-CM | POA: Diagnosis not present

## 2023-06-11 DIAGNOSIS — I872 Venous insufficiency (chronic) (peripheral): Secondary | ICD-10-CM | POA: Diagnosis not present

## 2023-06-11 DIAGNOSIS — I482 Chronic atrial fibrillation, unspecified: Secondary | ICD-10-CM | POA: Diagnosis not present

## 2023-06-11 DIAGNOSIS — L97909 Non-pressure chronic ulcer of unspecified part of unspecified lower leg with unspecified severity: Secondary | ICD-10-CM | POA: Diagnosis not present

## 2023-06-19 DIAGNOSIS — Z23 Encounter for immunization: Secondary | ICD-10-CM | POA: Diagnosis not present

## 2023-06-19 DIAGNOSIS — I872 Venous insufficiency (chronic) (peripheral): Secondary | ICD-10-CM | POA: Diagnosis not present

## 2023-06-19 DIAGNOSIS — L97909 Non-pressure chronic ulcer of unspecified part of unspecified lower leg with unspecified severity: Secondary | ICD-10-CM | POA: Diagnosis not present

## 2023-06-26 DIAGNOSIS — L97919 Non-pressure chronic ulcer of unspecified part of right lower leg with unspecified severity: Secondary | ICD-10-CM | POA: Diagnosis not present

## 2023-06-26 DIAGNOSIS — I872 Venous insufficiency (chronic) (peripheral): Secondary | ICD-10-CM | POA: Diagnosis not present

## 2023-06-26 DIAGNOSIS — R300 Dysuria: Secondary | ICD-10-CM | POA: Diagnosis not present

## 2023-06-26 DIAGNOSIS — L97929 Non-pressure chronic ulcer of unspecified part of left lower leg with unspecified severity: Secondary | ICD-10-CM | POA: Diagnosis not present

## 2023-06-26 DIAGNOSIS — R600 Localized edema: Secondary | ICD-10-CM | POA: Diagnosis not present

## 2023-06-26 DIAGNOSIS — I482 Chronic atrial fibrillation, unspecified: Secondary | ICD-10-CM | POA: Diagnosis not present

## 2023-07-01 DIAGNOSIS — Z7901 Long term (current) use of anticoagulants: Secondary | ICD-10-CM | POA: Diagnosis not present

## 2023-07-01 DIAGNOSIS — I482 Chronic atrial fibrillation, unspecified: Secondary | ICD-10-CM | POA: Diagnosis not present

## 2023-07-29 DIAGNOSIS — Z7901 Long term (current) use of anticoagulants: Secondary | ICD-10-CM | POA: Diagnosis not present

## 2023-07-29 DIAGNOSIS — I482 Chronic atrial fibrillation, unspecified: Secondary | ICD-10-CM | POA: Diagnosis not present

## 2023-08-18 ENCOUNTER — Other Ambulatory Visit: Payer: Self-pay

## 2023-08-18 ENCOUNTER — Emergency Department: Payer: PPO

## 2023-08-18 ENCOUNTER — Observation Stay
Admission: EM | Admit: 2023-08-18 | Discharge: 2023-08-19 | Disposition: A | Discharge status: Home / Self Care | Payer: PPO | Attending: Osteopathic Medicine | Admitting: Osteopathic Medicine

## 2023-08-18 DIAGNOSIS — R42 Dizziness and giddiness: Principal | ICD-10-CM

## 2023-08-18 DIAGNOSIS — Z7982 Long term (current) use of aspirin: Secondary | ICD-10-CM | POA: Diagnosis not present

## 2023-08-18 DIAGNOSIS — Z85828 Personal history of other malignant neoplasm of skin: Secondary | ICD-10-CM | POA: Diagnosis not present

## 2023-08-18 DIAGNOSIS — I10 Essential (primary) hypertension: Secondary | ICD-10-CM | POA: Diagnosis present

## 2023-08-18 DIAGNOSIS — Z853 Personal history of malignant neoplasm of breast: Secondary | ICD-10-CM | POA: Insufficient documentation

## 2023-08-18 DIAGNOSIS — Z79899 Other long term (current) drug therapy: Secondary | ICD-10-CM | POA: Insufficient documentation

## 2023-08-18 DIAGNOSIS — H81399 Other peripheral vertigo, unspecified ear: Secondary | ICD-10-CM

## 2023-08-18 DIAGNOSIS — N1831 Chronic kidney disease, stage 3a: Secondary | ICD-10-CM | POA: Diagnosis not present

## 2023-08-18 DIAGNOSIS — I129 Hypertensive chronic kidney disease with stage 1 through stage 4 chronic kidney disease, or unspecified chronic kidney disease: Secondary | ICD-10-CM | POA: Insufficient documentation

## 2023-08-18 DIAGNOSIS — E876 Hypokalemia: Principal | ICD-10-CM | POA: Diagnosis present

## 2023-08-18 DIAGNOSIS — R262 Difficulty in walking, not elsewhere classified: Secondary | ICD-10-CM | POA: Diagnosis not present

## 2023-08-18 DIAGNOSIS — J449 Chronic obstructive pulmonary disease, unspecified: Secondary | ICD-10-CM | POA: Diagnosis not present

## 2023-08-18 DIAGNOSIS — R2681 Unsteadiness on feet: Secondary | ICD-10-CM | POA: Diagnosis not present

## 2023-08-18 DIAGNOSIS — I480 Paroxysmal atrial fibrillation: Secondary | ICD-10-CM | POA: Diagnosis not present

## 2023-08-18 DIAGNOSIS — Z7901 Long term (current) use of anticoagulants: Secondary | ICD-10-CM | POA: Insufficient documentation

## 2023-08-18 DIAGNOSIS — Z96653 Presence of artificial knee joint, bilateral: Secondary | ICD-10-CM | POA: Insufficient documentation

## 2023-08-18 DIAGNOSIS — R110 Nausea: Secondary | ICD-10-CM | POA: Diagnosis not present

## 2023-08-18 DIAGNOSIS — E039 Hypothyroidism, unspecified: Secondary | ICD-10-CM | POA: Diagnosis present

## 2023-08-18 DIAGNOSIS — I739 Peripheral vascular disease, unspecified: Secondary | ICD-10-CM | POA: Diagnosis present

## 2023-08-18 DIAGNOSIS — I4891 Unspecified atrial fibrillation: Secondary | ICD-10-CM | POA: Diagnosis present

## 2023-08-18 DIAGNOSIS — R55 Syncope and collapse: Secondary | ICD-10-CM | POA: Diagnosis not present

## 2023-08-18 LAB — CBC
HCT: 36.6 % (ref 36.0–46.0)
Hemoglobin: 12.3 g/dL (ref 12.0–15.0)
MCH: 30.8 pg (ref 26.0–34.0)
MCHC: 33.6 g/dL (ref 30.0–36.0)
MCV: 91.7 fL (ref 80.0–100.0)
Platelets: 199 10*3/uL (ref 150–400)
RBC: 3.99 MIL/uL (ref 3.87–5.11)
RDW: 12.9 % (ref 11.5–15.5)
WBC: 4.5 10*3/uL (ref 4.0–10.5)
nRBC: 0 % (ref 0.0–0.2)

## 2023-08-18 LAB — COMPREHENSIVE METABOLIC PANEL
ALT: 14 U/L (ref 0–44)
AST: 25 U/L (ref 15–41)
Albumin: 3.6 g/dL (ref 3.5–5.0)
Alkaline Phosphatase: 69 U/L (ref 38–126)
Anion gap: 14 (ref 5–15)
BUN: 20 mg/dL (ref 8–23)
CO2: 20 mmol/L — ABNORMAL LOW (ref 22–32)
Calcium: 8.5 mg/dL — ABNORMAL LOW (ref 8.9–10.3)
Chloride: 107 mmol/L (ref 98–111)
Creatinine, Ser: 1.13 mg/dL — ABNORMAL HIGH (ref 0.44–1.00)
GFR, Estimated: 47 mL/min — ABNORMAL LOW (ref >60)
Glucose, Bld: 198 mg/dL — ABNORMAL HIGH (ref 70–99)
Potassium: 2.4 mmol/L — CL (ref 3.5–5.1)
Sodium: 141 mmol/L (ref 135–145)
Total Bilirubin: 0.5 mg/dL (ref <1.2)
Total Protein: 6.5 g/dL (ref 6.5–8.1)

## 2023-08-18 LAB — LIPASE, BLOOD: Lipase: 32 U/L (ref 11–51)

## 2023-08-18 MED ORDER — SODIUM CHLORIDE 0.9% FLUSH
3.0000 mL | Freq: Two times a day (BID) | INTRAVENOUS | Status: DC
  Administered 2023-08-19 (×2): 3 mL via INTRAVENOUS

## 2023-08-18 MED ORDER — MECLIZINE HCL 25 MG PO TABS
25.0000 mg | ORAL_TABLET | Freq: Once | ORAL | Status: AC
  Administered 2023-08-18: 25 mg via ORAL
  Filled 2023-08-18: qty 1

## 2023-08-18 MED ORDER — ACETAMINOPHEN 650 MG RE SUPP: 650.0000 mg | Freq: Four times a day (QID) | RECTAL | Status: DC | PRN

## 2023-08-18 MED ORDER — POTASSIUM CHLORIDE CRYS ER 20 MEQ PO TBCR
40.0000 meq | EXTENDED_RELEASE_TABLET | Freq: Once | ORAL | Status: AC
  Administered 2023-08-19: 40 meq via ORAL
  Filled 2023-08-18: qty 2

## 2023-08-18 MED ORDER — CIPROFLOXACIN HCL 500 MG PO TABS
500.0000 mg | ORAL_TABLET | Freq: Every day | ORAL | Status: DC
  Administered 2023-08-19: 500 mg via ORAL
  Filled 2023-08-18: qty 1

## 2023-08-18 MED ORDER — MECLIZINE HCL 25 MG PO TABS: 12.5000 mg | ORAL_TABLET | Freq: Three times a day (TID) | ORAL | Status: DC | PRN

## 2023-08-18 MED ORDER — AMIODARONE HCL 200 MG PO TABS: 300.0000 mg | ORAL_TABLET | Freq: Every day | ORAL | Status: DC

## 2023-08-18 MED ORDER — VENLAFAXINE HCL ER 37.5 MG PO CP24
37.5000 mg | ORAL_CAPSULE | Freq: Every day | ORAL | Status: DC
  Administered 2023-08-19: 37.5 mg via ORAL
  Filled 2023-08-18: qty 1

## 2023-08-18 MED ORDER — SODIUM CHLORIDE 0.9 % IV BOLUS
500.0000 mL | Freq: Once | INTRAVENOUS | Status: AC
  Administered 2023-08-18: 500 mL via INTRAVENOUS

## 2023-08-18 MED ORDER — POTASSIUM CHLORIDE 10 MEQ/100ML IV SOLN
10.0000 meq | Freq: Once | INTRAVENOUS | Status: AC
  Administered 2023-08-18: 10 meq via INTRAVENOUS
  Filled 2023-08-18: qty 100

## 2023-08-18 MED ORDER — LEVOTHYROXINE SODIUM 50 MCG PO TABS
50.0000 ug | ORAL_TABLET | Freq: Every day | ORAL | Status: DC
  Administered 2023-08-19: 50 ug via ORAL
  Filled 2023-08-18: qty 1

## 2023-08-18 MED ORDER — ALPRAZOLAM 0.25 MG PO TABS: 0.2500 mg | ORAL_TABLET | Freq: Every evening | ORAL | Status: DC | PRN

## 2023-08-18 MED ORDER — ONDANSETRON HCL 4 MG/2ML IJ SOLN
4.0000 mg | Freq: Once | INTRAMUSCULAR | Status: AC
  Administered 2023-08-18: 4 mg via INTRAVENOUS
  Filled 2023-08-18: qty 2

## 2023-08-18 MED ORDER — ACETAMINOPHEN 325 MG PO TABS: 650.0000 mg | ORAL_TABLET | Freq: Four times a day (QID) | ORAL | Status: DC | PRN

## 2023-08-18 MED ORDER — ONDANSETRON HCL 4 MG PO TABS: 4.0000 mg | ORAL_TABLET | Freq: Three times a day (TID) | ORAL | Status: DC | PRN

## 2023-08-18 NOTE — Assessment & Plan Note (Addendum)
Likely secondary to lasix Continue IV repletion and monitor

## 2023-08-18 NOTE — Assessment & Plan Note (Signed)
Suspect peripheral however given risk factors will get MRI to evaluate for central vertigo Continue meclizine

## 2023-08-18 NOTE — H&P (Incomplete)
History and Physical    Patient: Kelly Bishop TKZ:[id number] DOB: [birth year] DOA: 08/18/2023 DOS: the patient was seen and examined on 08/18/2023 PCP: Danella Penton, MD  Patient coming from: Home  Chief Complaint:  Chief Complaint  Patient presents with   Nausea    HPI: Kelly Bishop is a 89 y.o. female with medical history significant for  CKD stage IIIa, paroxysmal A-fib on amiodarone and Coumadin, depression, hypothyroidism, PAD, chronic spine infection on daily ofloxacin, Zenker's diverticulum, being admitted with acute vertigo following a vigorous episode of sneezing, with incidental finding of low potassium of 2.4 on workup.  She was previously in her usual state of health. ED course And data review: Tachypneic on arrival to 29 with otherwise normal vitals Labs including CBC, CMP and lipase notable for potassium of 2.4.  Creatinine was at baseline at 1.13 with bicarb 20.  Glucose 198. EKG, personally viewed and interpreted showing supraventricular bigeminy, ectopic atrial rhythm at 81 CT head with no acute intracranial hemorrhage or infarct Patient was treated with a 1 L NS bolus, meclizine and Zofran and started IV potassium 10 mill equivalent Patient had symptomatic improvement with initial treatment in the ED. Hospitalist consulted for admission.      Review of Systems: {ROS_Text:26778}  Past Medical History:  Diagnosis Date   Actinic keratosis 05/17/2008   L upper lip - bx proven   Actinic keratosis 10/06/2014   R mid pretibial med - bx proven   Actinic keratosis 11/23/2020   upper back spinal, bx proven   Anemia    Atrial fibrillation ([name])    B12 deficiency    Basal cell carcinoma 08/08/2009   R ant deltoid lat    Basal cell carcinoma 08/08/2009   R ant deltoid med   Basal cell carcinoma 07/31/2010   R mid pretibial   Basal cell carcinoma 10/06/2014   L distal ant thigh   Basal cell carcinoma 10/11/2015   L med distal tricep near elbow   Basal  cell carcinoma 02/19/2017   R prox mandible - excision 05/27/2017   Basal cell carcinoma 04/23/2017   L distal pretibial    Basal cell carcinoma 01/25/2020   R forehead above lat brow   Breast cancer ([name]) 2015   Right   Breast cancer, right breast ([name])    right breast Invasive mammary carcinoma grade 1   Calculus of bile duct without cholecystitis and without obstruction    Cataracts, bilateral    COPD (chronic obstructive pulmonary disease) ([name])    Hemorrhoids    Hypokalemia 05/16/2022   Lymphopenia    Osteoarthritis    Osteoporosis July 2015   seen on DEXA scan-T score of -2.9 in the left neck femur   Pancytopenia ([name])    Personal history of radiation therapy    Skin cancer    Spastic dysphonia    Squamous cell carcinoma of skin 06/22/2007   R mid pretibial   Squamous cell carcinoma of skin 05/17/2008   R pretibial    Squamous cell carcinoma of skin 08/08/2009   R dorsum prox wrist   Squamous cell carcinoma of skin 10/06/2014   L pretibial    Squamous cell carcinoma of skin 12/08/2017   L pretibial    Squamous cell carcinoma of skin 07/26/2019   L mid lat pretibial    Thrombocytopenia ([name])    Unsteady gait    Zenker's diverticulum    Past Surgical History:  Procedure Laterality Date   BREAST EXCISIONAL  BIOPSY Left    x 20 years surgical bx   BREAST EXCISIONAL BIOPSY Left 01/24/2016   ultrasound - Papaloma   BREAST LUMPECTOMY Right 2015   BREAST LUMPECTOMY WITH NEEDLE LOCALIZATION Left 03/12/2016   Procedure: BREAST LUMPECTOMY WITH NEEDLE LOCALIZATION;  Surgeon: Nadeen Landau, MD;  Location: [facility] ORS;  Service: General;  Laterality: Left;   ERCP N/A 05/14/2022   Procedure: ENDOSCOPIC RETROGRADE CHOLANGIOPANCREATOGRAPHY (ERCP);  Surgeon: Midge Minium, MD;  Location: [facility] ENDOSCOPY;  Service: Endoscopy;  Laterality: N/A;   HYSTEROSCOPY WITH D & C N/A 07/05/2016   Procedure: DILATATION AND CURETTAGE /HYSTEROSCOPY;  Surgeon: Christeen Douglas, MD;  Location: [facility]  ORS;  Service: Gynecology;  Laterality: N/A;   L1-L4 laminectomy and T6 fusion  May 2011   complicated by postoperative infection, more recently had recurrent pseudomonas infection at scar site March 2015   REPLACEMENT TOTAL KNEE Bilateral    ROTATOR CUFF REPAIR Bilateral    ultrasound guided biopsy of breast  Jan 27, 2014   Social History:  reports that she has never smoked. She has never been exposed to tobacco smoke. She has never used smokeless tobacco. She reports that she does not drink alcohol and does not use drugs.  Allergies  Allergen Reactions   Ace Inhibitors Cough   Beta Adrenergic Blockers Other (See Comments)   Morphine And Codeine Nausea And Vomiting   Paroxetine Hcl     Other reaction(s): Dizziness   Peanut-Containing Drug Products Hives   Codeine Nausea Only and Rash    Family History  Problem Relation Age of Onset   Throat cancer Mother    Prostate cancer Brother    Breast cancer Brother    Leukemia Brother    Lung cancer Father     Prior to Admission medications   Medication Sig Start Date End Date Taking? Authorizing Provider  ALPRAZolam Prudy Feeler) 0.5 MG tablet Take 0.25 mg by mouth at bedtime.    [provider]  amiodarone (PACERONE) 200 MG tablet Take 300 mg by mouth daily. 10/02/16   [provider]  aspirin 81 MG chewable tablet Chew 81 mg by mouth daily.     [provider]  bisacodyl (DULCOLAX) 5 MG EC tablet Take 10 mg by mouth at bedtime.     [provider]  Calcium Carbonate-Vit D-Min (CALCIUM 600+D PLUS MINERALS) 600-400 MG-UNIT CHEW Chew 1 Dose by mouth daily.     [provider]  Cholecalciferol 2000 units CAPS Take 1 capsule by mouth daily.    [provider]  ciprofloxacin (CIPRO) 500 MG tablet Take 500 mg by mouth 2 (two) times daily.     [provider]  Cyanocobalamin 1000 MCG TBCR Take 1,000 mcg by mouth daily.     [provider]  fexofenadine (ALLEGRA) 180 MG tablet  Take 180 mg by mouth daily.    [provider]  furosemide (LASIX) 40 MG tablet Take 40 mg by mouth daily as needed.     [provider]  levothyroxine (SYNTHROID) 50 MCG tablet Take 50 mcg by mouth every morning. 03/09/22   [provider]  ondansetron (ZOFRAN) 4 MG tablet Take 1 tablet (4 mg total) by mouth every 8 (eight) hours as needed for nausea or vomiting. 05/17/22   Esaw Grandchild A, DO  tamsulosin (FLOMAX) 0.4 MG CAPS capsule Take 1 capsule (0.4 mg total) by mouth daily. 05/18/22   Esaw Grandchild A, DO  venlafaxine XR (EFFEXOR-XR) 37.5 MG 24 hr capsule Take 37.5  mg by mouth daily. 04/21/22   [provider]  warfarin (COUMADIN) 5 MG tablet Take by mouth. 05/29/22   [provider]    Physical Exam: Vitals:   08/18/23 2037 08/18/23 2039 08/18/23 2042  BP:  (!) 149/55   Pulse:   83  Resp:  (!) 29   Temp:   97.8 F (36.6 C)  TempSrc:   Oral  SpO2:  97%   Weight: 59 kg    Height: 5\' 5"  (1.651 m)     Physical Exam  Labs on Admission: I have personally reviewed following labs and imaging studies  CBC: Recent Labs  Lab 08/18/23 2040  WBC 4.5  HGB 12.3  HCT 36.6  MCV 91.7  PLT 199   Basic Metabolic Panel: Recent Labs  Lab 08/18/23 2040  NA 141  K 2.4*  CL 107  CO2 20*  GLUCOSE 198*  BUN 20  CREATININE 1.13*  CALCIUM 8.5*   GFR: Estimated Creatinine Clearance: 30.4 mL/min (A) (by C-G formula based on SCr of 1.13 mg/dL (H)). Liver Function Tests: Recent Labs  Lab 08/18/23 2040  AST 25  ALT 14  ALKPHOS 69  BILITOT 0.5  PROT 6.5  ALBUMIN 3.6   Recent Labs  Lab 08/18/23 2040  LIPASE 32   No results for input(s): "AMMONIA" in the last 168 hours. Coagulation Profile: No results for input(s): "INR", "PROTIME" in the last 168 hours. Cardiac Enzymes: No results for input(s): "CKTOTAL", "CKMB", "CKMBINDEX", "TROPONINI" in the last 168 hours. BNP (last 3 results) No results for input(s): "PROBNP" in the last 8760  hours. HbA1C: No results for input(s): "HGBA1C" in the last 72 hours. CBG: No results for input(s): "GLUCAP" in the last 168 hours. Lipid Profile: No results for input(s): "CHOL", "HDL", "LDLCALC", "TRIG", "CHOLHDL", "LDLDIRECT" in the last 72 hours. Thyroid Function Tests: No results for input(s): "TSH", "T4TOTAL", "FREET4", "T3FREE", "THYROIDAB" in the last 72 hours. Anemia Panel: No results for input(s): "VITAMINB12", "FOLATE", "FERRITIN", "TIBC", "IRON", "RETICCTPCT" in the last 72 hours. Urine analysis:    Component Value Date/Time   COLORURINE AMBER (A) 05/12/2022 2119   APPEARANCEUR CLOUDY (A) 05/12/2022 2119   APPEARANCEUR Clear 08/13/2014 2204   LABSPEC 1.019 05/12/2022 2119   LABSPEC 1.003 08/13/2014 2204   PHURINE 6.0 05/12/2022 2119   GLUCOSEU NEGATIVE 05/12/2022 2119   GLUCOSEU Negative 08/13/2014 2204   HGBUR LARGE (A) 05/12/2022 2119   BILIRUBINUR NEGATIVE 05/12/2022 2119   BILIRUBINUR Negative 12/28/2012 1400   KETONESUR NEGATIVE 05/12/2022 2119   PROTEINUR 100 (A) 05/12/2022 2119   NITRITE NEGATIVE 05/12/2022 2119   LEUKOCYTESUR MODERATE (A) 05/12/2022 2119   LEUKOCYTESUR Negative 08/13/2014 2204    Radiological Exams on Admission: CT Head Wo Contrast  Result Date: 08/18/2023 CLINICAL DATA:  Syncope/presyncope, cerebrovascular cause suspected. Nausea EXAM: CT HEAD WITHOUT CONTRAST TECHNIQUE: Contiguous axial images were obtained from the base of the skull through the vertex without intravenous contrast. RADIATION DOSE REDUCTION: This exam was performed according to the departmental dose-optimization program which includes automated exposure control, adjustment of the mA and/or kV according to patient size and/or use of iterative reconstruction technique. COMPARISON:  11/24/2018 FINDINGS: Brain: Normal anatomic configuration. Parenchymal volume loss is commensurate with the patient's age. Mild periventricular white matter changes are present likely reflecting the  sequela of small vessel ischemia. No abnormal intra or extra-axial mass lesion or fluid collection. No abnormal mass effect or midline shift. No evidence of acute intracranial hemorrhage or infarct. Ventricular size is normal. Cerebellum  unremarkable. Vascular: No asymmetric hyperdense vasculature at the skull base. Skull: Intact Sinuses/Orbits: Paranasal sinuses are clear. Orbits are unremarkable. Other: Mastoid air cells and middle ear cavities are clear. IMPRESSION: 1. No acute intracranial hemorrhage or infarct. 2. Mild senescent change. Electronically Signed   By: Helyn Numbers M.D.   On: 08/18/2023 22:58     Data Reviewed: Relevant notes from primary care and specialist visits, past discharge summaries as available in EHR, including Care Everywhere. Prior diagnostic testing as pertinent to current admission diagnoses Updated medications and problem lists for reconciliation ED course, including vitals, labs, imaging, treatment and response to treatment Triage notes, nursing and pharmacy notes and ED provider's notes Notable results as noted in HPI   Assessment and Plan: Vertigo Suspect peripheral however given risk factors will get MRI to evaluate for central vertigo Continue meclizine  Hypokalemia Continue IV repletion and monitor   Long term (current) use of antibiotics Patient is on long-term daily Cipro.  Per PCP note for spinal infection   PAD (peripheral artery disease) ([name]) Continue aspirin and statin   COPD (chronic obstructive pulmonary disease) ([name]) DuoNebs as needed   Stage 3a chronic kidney disease ([name]) Renal function at baseline   Atrial fibrillation ([name]) Continue amiodarone Pharmacy consult for warfarin   Benign essential hypertension Continue Lasix      DVT prophylaxis: Coumadin  Consults: none  Advance Care Planning:   Code Status: Prior   Family Communication: none  Disposition Plan: Back to previous home environment  Severity of  Illness: The appropriate patient status for this patient is OBSERVATION. Observation status is judged to be reasonable and necessary in order to provide the required intensity of service to ensure the patient's safety. The patient's presenting symptoms, physical exam findings, and initial radiographic and laboratory data in the context of their medical condition is felt to place them at decreased risk for further clinical deterioration. Furthermore, it is anticipated that the patient will be medically stable for discharge from the hospital within 2 midnights of admission.   Author: Andris Baumann, MD 08/18/2023 11:42 PM  For on call review [email or web address].

## 2023-08-18 NOTE — ED Triage Notes (Signed)
Pt arrived by EMS from home. Pt has been having Nausea with Diarrhea for the last couple of days with a pain that is behind the pt right ear. Pt received 4mg  of zofran via 18g in RAC.

## 2023-08-18 NOTE — ED Provider Notes (Signed)
[facility] Provider Note    Event Date/Time   First MD Initiated Contact with Patient 08/18/23 2041     (approximate)   History   Nausea   HPI  Kelly Bishop is a 89 y.o. female with a history of CKD, paroxysmal atrial fibrillation, and COPD who presents with acute onset of nausea a few hours ago associated with severe dizziness which she describes as a sensation of everything moving around her or spinning.  She states it started while she was sneezing vigorously.  The dizziness gets better when she closes her eyes and lies still.  It is worse when she is moving around.  In addition to the spinning sensation she reports tingling over her whole body.  She denies headache, acute change in her hearing, ear pain, fever, chest pain, or difficulty breathing.  She denies any prior history of similar symptoms.  I reviewed the past medical records.  The patient was most recent admitted to the hospitalist service in August 2023 due to hydronephrosis with an obstructing stone and acute pyelonephritis.   Physical Exam   Triage Vital Signs: ED Triage Vitals  Encounter Vitals Group     BP 08/18/23 2039 (!) 149/55     Systolic BP Percentile --      Diastolic BP Percentile --      Pulse Rate 08/18/23 2042 83     Resp 08/18/23 2039 (!) 29     Temp 08/18/23 2042 97.8 F (36.6 C)     Temp Source 08/18/23 2042 Oral     SpO2 08/18/23 2039 97 %     Weight 08/18/23 2037 130 lb (59 kg)     Height 08/18/23 2037 5\' 5"  (1.651 m)     Head Circumference --      Peak Flow --      Pain Score 08/18/23 2037 0     Pain Loc --      Pain Education --      Exclude from Growth Chart --     Most recent vital signs: Vitals:   08/18/23 2039 08/18/23 2042  BP: (!) 149/55   Pulse:  83  Resp: (!) 29   Temp:  97.8 F (36.6 C)  SpO2: 97%      General: Alert and oriented, uncomfortable appearing but in no acute distress. CV:  Good peripheral perfusion.  Resp:  Normal effort.   Lungs CTAB. Abd:  Soft and nontender.  No distention.  Other:  EOMI.  PERRLA.  No nystagmus.  No facial droop.  Normal speech.  No ataxia on finger-to-nose.  Motor intact in all extremities.   ED Results / Procedures / Treatments   Labs (all labs ordered are listed, but only abnormal results are displayed) Labs Reviewed  COMPREHENSIVE METABOLIC PANEL - Abnormal; Notable for the following components:      Result Value   Potassium 2.4 (*)    CO2 20 (*)    Glucose, Bld 198 (*)    Creatinine, Ser 1.13 (*)    Calcium 8.5 (*)    GFR, Estimated 47 (*)    All other components within normal limits  LIPASE, BLOOD  CBC  URINALYSIS, ROUTINE W REFLEX MICROSCOPIC  TROPONIN I (HIGH SENSITIVITY)  TROPONIN I (HIGH SENSITIVITY)     EKG  ED ECG REPORT I, Dionne Bucy, the attending physician, personally viewed and interpreted this ECG.  Date: 08/18/2023 EKG Time: 2038 Rate: 81 Rhythm: Ectopic atrial rhythm QRS Axis: normal Intervals: Prolonged  QTc ST/T Wave abnormalities: normal Narrative Interpretation: no evidence of acute ischemia    RADIOLOGY  CT head: I independently viewed and interpreted the images; there is no ICH.  Radiology report indicates no acute abnormality  PROCEDURES:  Critical Care performed: No  Procedures   MEDICATIONS ORDERED IN ED: Medications  potassium chloride SA (KLOR-CON M) CR tablet 40 mEq (has no administration in time range)  sodium chloride 0.9 % bolus 500 mL (0 mLs Intravenous Stopped 08/18/23 2147)  ondansetron (ZOFRAN) injection 4 mg (4 mg Intravenous Given 08/18/23 2114)  meclizine (ANTIVERT) tablet 25 mg (25 mg Oral Given 08/18/23 2114)  potassium chloride 10 mEq in 100 mL IVPB (10 mEq Intravenous New Bag/Given 08/18/23 2147)  potassium chloride 10 mEq in 100 mL IVPB (10 mEq Intravenous New Bag/Given 08/18/23 2148)     IMPRESSION / MDM / ASSESSMENT AND PLAN / ED COURSE  I reviewed the triage vital signs and the nursing  notes.  89 year old female with PMH as noted above presents with acute onset of dizziness this evening and has had some nausea and diarrhea for the last few days.  On exam the patient is uncomfortable appearing but in no distress.  Her vital signs are normal.  Neurologic exam is nonfocal.  Differential diagnosis includes, but is not limited to, per vertigo, less likely central vertigo, electrolyte abnormality, other metabolic cause, viral infection, less likely cardiac etiology.  We will give fluids, Zofran, meclizine, obtain a CT head, labs, and reassess.  Patient's presentation is most consistent with acute presentation with potential threat to life or bodily function.  The patient is on the cardiac monitor to evaluate for evidence of arrhythmia and/or significant heart rate changes.  ----------------------------------------- 11:29 PM on 08/18/2023 -----------------------------------------  CT head is negative.  BMP is significant for hypokalemia.  I have ordered IV and p.o. potassium.  LFTs and lipase are normal.  There is no leukocytosis.  Troponin is pending.  On reassessment, the patient is feeling significantly better.  The dizziness has almost completely resolved.  Given the significant hypokalemia she will need admission for further management.  I consulted Dr. Para March from the hospitalist service; based on our discussion she agrees to evaluate the patient for admission.   FINAL CLINICAL IMPRESSION(S) / ED DIAGNOSES   Final diagnoses:  Hypokalemia  Peripheral vertigo, unspecified laterality     Rx / DC Orders   ED Discharge Orders     None        Note:  This document was prepared using Dragon voice recognition software and may include unintentional dictation errors.    Dionne Bucy, MD 08/18/23 [phone]

## 2023-08-19 ENCOUNTER — Observation Stay: Payer: PPO

## 2023-08-19 ENCOUNTER — Encounter: Payer: Self-pay | Admitting: Radiology

## 2023-08-19 DIAGNOSIS — R220 Localized swelling, mass and lump, head: Secondary | ICD-10-CM | POA: Diagnosis not present

## 2023-08-19 DIAGNOSIS — E876 Hypokalemia: Secondary | ICD-10-CM

## 2023-08-19 DIAGNOSIS — I6782 Cerebral ischemia: Secondary | ICD-10-CM | POA: Diagnosis not present

## 2023-08-19 LAB — PROTIME-INR
INR: 1.9 — ABNORMAL HIGH (ref 0.8–1.2)
Prothrombin Time: 22.3 s — ABNORMAL HIGH (ref 11.4–15.2)

## 2023-08-19 LAB — URINALYSIS, ROUTINE W REFLEX MICROSCOPIC
Bilirubin Urine: NEGATIVE
Glucose, UA: 50 mg/dL — AB
Hgb urine dipstick: NEGATIVE
Ketones, ur: NEGATIVE mg/dL
Leukocytes,Ua: NEGATIVE
Nitrite: NEGATIVE
Protein, ur: NEGATIVE mg/dL
Specific Gravity, Urine: 1.008 (ref 1.005–1.030)
pH: 6 (ref 5.0–8.0)

## 2023-08-19 LAB — BASIC METABOLIC PANEL
Anion gap: 9 (ref 5–15)
Anion gap: 9 (ref 5–15)
BUN: 13 mg/dL (ref 8–23)
BUN: 12 mg/dL (ref 8–23)
CO2: 24 mmol/L (ref 22–32)
CO2: 25 mmol/L (ref 22–32)
Calcium: 8.8 mg/dL — ABNORMAL LOW (ref 8.9–10.3)
Calcium: 8.9 mg/dL (ref 8.9–10.3)
Chloride: 110 mmol/L (ref 98–111)
Chloride: 108 mmol/L (ref 98–111)
Creatinine, Ser: 1.04 mg/dL — ABNORMAL HIGH (ref 0.44–1.00)
Creatinine, Ser: 1.1 mg/dL — ABNORMAL HIGH (ref 0.44–1.00)
GFR, Estimated: 51 mL/min — ABNORMAL LOW (ref >60)
GFR, Estimated: 48 mL/min — ABNORMAL LOW (ref >60)
Glucose, Bld: 103 mg/dL — ABNORMAL HIGH (ref 70–99)
Glucose, Bld: 114 mg/dL — ABNORMAL HIGH (ref 70–99)
Potassium: 3.2 mmol/L — ABNORMAL LOW (ref 3.5–5.1)
Potassium: 3.6 mmol/L (ref 3.5–5.1)
Sodium: 143 mmol/L (ref 135–145)
Sodium: 142 mmol/L (ref 135–145)

## 2023-08-19 LAB — TROPONIN I (HIGH SENSITIVITY)
Troponin I (High Sensitivity): 9 ng/L (ref <18)
Troponin I (High Sensitivity): 9 ng/L (ref <18)

## 2023-08-19 LAB — CBG MONITORING, ED: Glucose-Capillary: 116 mg/dL — ABNORMAL HIGH (ref 70–99)

## 2023-08-19 MED ORDER — POTASSIUM CHLORIDE CRYS ER 20 MEQ PO TBCR
40.0000 meq | EXTENDED_RELEASE_TABLET | Freq: Once | ORAL | Status: AC
  Administered 2023-08-19: 40 meq via ORAL
  Filled 2023-08-19: qty 2

## 2023-08-19 MED ORDER — WARFARIN - PHARMACIST DOSING INPATIENT
Freq: Every day | Status: DC
  Filled 2023-08-19: qty 1

## 2023-08-19 MED ORDER — VERAPAMIL HCL ER 120 MG PO TBCR: 120.0000 mg | EXTENDED_RELEASE_TABLET | Freq: Every day | ORAL | Status: DC

## 2023-08-19 MED ORDER — VERAPAMIL HCL ER 120 MG PO TBCR
120.0000 mg | EXTENDED_RELEASE_TABLET | Freq: Every day | ORAL | Status: DC
  Administered 2023-08-19: 120 mg via ORAL
  Filled 2023-08-19: qty 1

## 2023-08-19 MED ORDER — DILTIAZEM HCL 25 MG/5ML IV SOLN
5.0000 mg | Freq: Once | INTRAVENOUS | Status: AC
  Administered 2023-08-19: 5 mg via INTRAVENOUS
  Filled 2023-08-19: qty 5

## 2023-08-19 MED ORDER — WARFARIN SODIUM 7.5 MG PO TABS
7.5000 mg | ORAL_TABLET | Freq: Once | ORAL | Status: DC
  Filled 2023-08-19: qty 1

## 2023-08-19 NOTE — ED Notes (Signed)
Pt back from MRI 

## 2023-08-19 NOTE — Progress Notes (Signed)
       CROSS COVER NOTE  NAME: Kelly Bishop MRN: [id number] DOB : July 15, 1932    Date of Service   08/19/2023   HPI/Events of Note   Nurse paged because patient was noted to be in A-fib RVR after attempting to get out of bed to use the bathroom.  Chart review showed that patient has a history of A-fib RVR and takes verapamil at home.  Patient has not had a dose since been in the hospital.  Patient is alert and oriented and blood pressure stable at this time.  Interventions   -Patient given a one-time push dose of diltiazem IV. -Patient's home dose of verapamil was restarted per the admission notes by Dr. Para March. -Consider consulting cardiology in the a.m. if warranted.       Raudel Bazen Lamin Geradine Girt, MSN, APRN, AGACNP-BC [facility] [place] Pager: [phone]. Check Amion for Availability

## 2023-08-19 NOTE — Discharge Summary (Signed)
Physician Discharge Summary   Patient: Kelly Bishop MRN: [id number]  DOB: August 06, 1932   Admit:     Date of Admission: 08/18/2023 Admitted from: home   Discharge: Date of discharge: 08/19/23 Disposition: Home Condition at discharge: good  CODE STATUS: DNR     Discharge Physician: Sunnie Nielsen, DO [facility]     PCP: Danella Penton, MD  Recommendations for Outpatient Follow-up:  Follow up with PCP Danella Penton, MD in 1-2 weeks Please obtain labs/tests: BMP, EKG in 1-2 weeks PCP AND OTHER OUTPATIENT PROVIDERS: SEE BELOW FOR SPECIFIC DISCHARGE INSTRUCTIONS PRINTED FOR PATIENT IN ADDITION TO GENERIC AVS PATIENT INFO    Discharge Instructions     Diet - low sodium heart healthy   Complete by: As directed    Increase activity slowly   Complete by: As directed          Discharge Diagnoses: Active Problems:   Vertigo   Hypokalemia   Atrial fibrillation ([name])   Benign essential hypertension   Chronic kidney disease, stage 3a ([name])   COPD (chronic obstructive pulmonary disease) ([name])   Acquired hypothyroidism   PAD (peripheral artery disease) ([name])        HPI: Kelly Bishop is a 89 y.o. female with medical history significant for  CKD stage IIIa, paroxysmal A-fib on Coumadin, depression, hypothyroidism, PAD, chronic spine infection on daily ofloxacin, Zenker's diverticulum, being admitted with acute vertigo following a vigorous episode of sneezing, with incidental finding of low potassium of 2.4 on workup.   Patient said she sneezed a couple of times and suddenly the room started spinning and she had nausea and then had a sudden urge to have a bowel movement and states it was like diarrhea.  States she was previously in her usual state of health.  Symptoms had improved by the time of my evaluation but states she was feeling a bit of discomfort to the back of her head.  She also reports that she "feels A-fib" every morning.  She denies chest pain  or shortness of breath or palpitations.   Hospital course / significant events: admitted w/ vertigo/syncope and significant hypokalemia. Workup negative other than hypokalemia. Went into Afib RVR and improved w/ IV rx. Missed usual dose verapamil, HR improved once this was on board and patient was eager for discharge home stating her afib "always does this" and she would do better if she could get some sleep. HR improved anyway w/ verapamil and stable for discharge home   Consultants:  none  Procedures/Surgeries: none      ASSESSMENT & PLAN:   Vertigo Continue meclizine as needed   Hypokalemia Improved Follow outpatient   Atrial fibrillation Continue verapamil, warfarin  Long term (current) use of antibiotics Patient is on long-term daily Cipro.  Per PCP note for spinal infection   PAD (peripheral artery disease) ([name]) Continue aspirin and statin   COPD (chronic obstructive pulmonary disease) ([name]) DuoNebs as needed   Stage 3a chronic kidney disease ([name]) Renal function at baseline    Benign essential hypertension Will hold Lasix due to hypokalemia nurse patient looks clinically dry - advised to take this at home as needed for edema and follow w/ PCP re continue or stop this rx           Discharge Instructions  Allergies as of 08/19/2023       Reactions   Ace Inhibitors Cough   Beta Adrenergic Blockers Other (See Comments)   Morphine And  Codeine Nausea And Vomiting   Paroxetine Hcl    Other reaction(s): Dizziness   Peanut-containing Drug Products Hives   Codeine Nausea Only, Rash        Medication List     STOP taking these medications    amiodarone 200 MG tablet Commonly known as: PACERONE   aspirin 81 MG chewable tablet   ondansetron 4 MG tablet Commonly known as: Zofran   tamsulosin 0.4 MG Caps capsule Commonly known as: FLOMAX       TAKE these medications    ALPRAZolam 0.5 MG tablet Commonly known as: XANAX Take 0.25 mg by  mouth at bedtime.   bisacodyl 5 MG EC tablet Commonly known as: DULCOLAX Take 10 mg by mouth at bedtime.   Calcium 600+D Plus Minerals 600-400 MG-UNIT Chew Chew 1 Dose by mouth daily.   Cholecalciferol 50 MCG (2000 UT) Caps Take 1 capsule by mouth daily.   ciprofloxacin 500 MG tablet Commonly known as: CIPRO Take 500 mg by mouth 2 (two) times daily.   Cyanocobalamin 1000 MCG Tbcr Take 1,000 mcg by mouth daily.   fexofenadine 180 MG tablet Commonly known as: ALLEGRA Take 180 mg by mouth daily.   furosemide 40 MG tablet Commonly known as: LASIX Take 40 mg by mouth daily as needed.   levothyroxine 50 MCG tablet Commonly known as: SYNTHROID Take 50 mcg by mouth every morning.   potassium chloride 10 MEQ tablet Commonly known as: KLOR-CON Take 10 mEq by mouth daily as needed (When taking Lasix).   venlafaxine XR 37.5 MG 24 hr capsule Commonly known as: EFFEXOR-XR Take 37.5 mg by mouth daily.   verapamil 120 MG CR tablet Commonly known as: CALAN-SR Take 120 mg by mouth daily.   warfarin 5 MG tablet Commonly known as: COUMADIN Take 5-7.5 mg by mouth daily.          Allergies  Allergen Reactions   Ace Inhibitors Cough   Beta Adrenergic Blockers Other (See Comments)   Morphine And Codeine Nausea And Vomiting   Paroxetine Hcl     Other reaction(s): Dizziness   Peanut-Containing Drug Products Hives   Codeine Nausea Only and Rash     Subjective: pt reports feeling well this morning, would like to go home, HR still high but reexamined later in the dat few hours after verapamil administered and was better. Denies CP/SOB. Ambulating without weakness or dizzines    Discharge Exam: BP (!) 166/85   Pulse 80   Temp 98.5 F (36.9 C) (Oral)   Resp 15   Ht 5\' 5"  (1.651 m)   Wt 59 kg   SpO2 94%   BMI 21.63 kg/m  General: Pt is alert, awake, not in acute distress Cardiovascular: irreg irreg, tachy in AM then in PM normal rate, no rubs, no gallops Respiratory:  CTA bilaterally, no wheezing, no rhonchi Abdominal: Soft, NT, ND, bowel sounds + Extremities: no edema, no cyanosis     The results of significant diagnostics from this hospitalization (including imaging, microbiology, ancillary and laboratory) are listed below for reference.     Microbiology: No results found for this or any previous visit (from the past 240 hour(s)).   Labs: BNP (last 3 results) No results for input(s): "BNP" in the last 8760 hours. Basic Metabolic Panel: Recent Labs  Lab 08/18/23 2040 08/19/23 0600 08/19/23 0906  NA 141 143 142  K 2.4* 3.2* 3.6  CL 107 110 108  CO2 20* 24 25  GLUCOSE 198* 103* 114*  BUN  20 13 12   CREATININE 1.13* 1.04* 1.10*  CALCIUM 8.5* 8.8* 8.9   Liver Function Tests: Recent Labs  Lab 08/18/23 2040  AST 25  ALT 14  ALKPHOS 69  BILITOT 0.5  PROT 6.5  ALBUMIN 3.6   Recent Labs  Lab 08/18/23 2040  LIPASE 32   No results for input(s): "AMMONIA" in the last 168 hours. CBC: Recent Labs  Lab 08/18/23 2040  WBC 4.5  HGB 12.3  HCT 36.6  MCV 91.7  PLT 199   Cardiac Enzymes: No results for input(s): "CKTOTAL", "CKMB", "CKMBINDEX", "TROPONINI" in the last 168 hours. BNP: Invalid input(s): "POCBNP" CBG: Recent Labs  Lab 08/19/23 0651  GLUCAP 116*   D-Dimer No results for input(s): "DDIMER" in the last 72 hours. Hgb A1c No results for input(s): "HGBA1C" in the last 72 hours. Lipid Profile No results for input(s): "CHOL", "HDL", "LDLCALC", "TRIG", "CHOLHDL", "LDLDIRECT" in the last 72 hours. Thyroid function studies No results for input(s): "TSH", "T4TOTAL", "T3FREE", "THYROIDAB" in the last 72 hours.  Invalid input(s): "FREET3" Anemia work up No results for input(s): "VITAMINB12", "FOLATE", "FERRITIN", "TIBC", "IRON", "RETICCTPCT" in the last 72 hours. Urinalysis    Component Value Date/Time   COLORURINE STRAW (A) 08/19/2023 0102   APPEARANCEUR CLEAR (A) 08/19/2023 0102   APPEARANCEUR Clear 08/13/2014 2204    LABSPEC 1.008 08/19/2023 0102   LABSPEC 1.003 08/13/2014 2204   PHURINE 6.0 08/19/2023 0102   GLUCOSEU 50 (A) 08/19/2023 0102   GLUCOSEU Negative 08/13/2014 2204   HGBUR NEGATIVE 08/19/2023 0102   BILIRUBINUR NEGATIVE 08/19/2023 0102   BILIRUBINUR Negative 12/28/2012 1400   KETONESUR NEGATIVE 08/19/2023 0102   PROTEINUR NEGATIVE 08/19/2023 0102   NITRITE NEGATIVE 08/19/2023 0102   LEUKOCYTESUR NEGATIVE 08/19/2023 0102   LEUKOCYTESUR Negative 08/13/2014 2204   Sepsis Labs Recent Labs  Lab 08/18/23 2040  WBC 4.5   Microbiology No results found for this or any previous visit (from the past 240 hour(s)). Imaging MR BRAIN WO CONTRAST  Result Date: 08/19/2023 CLINICAL DATA:  Initial evaluation for acute TIA. EXAM: MRI HEAD WITHOUT CONTRAST TECHNIQUE: Multiplanar, multiecho pulse sequences of the brain and surrounding structures were obtained without intravenous contrast. COMPARISON:  CT from 08/18/2023. FINDINGS: Brain: Cerebral volume within normal limits. Patchy T2/FLAIR hyperintensity involving the periventricular deep white matter both cerebral hemispheres, consistent with chronic small vessel ischemic disease, mild for age. No evidence for acute or subacute ischemia. Gray-white matter differentiation maintained. No acute intracranial hemorrhage. Few scattered chronic micro hemorrhages noted, likely hypertensive in nature. 7 mm extra-axial mass overlying the anterior right frontal convexity, consistent with a small meningioma (series 12, image 32). No associated edema or mass effect. Possible additional small en plaque meningioma measuring 6 mm overlying the right temporal convexity (series 12, image 20). No other mass lesion. No mass effect or midline shift. No hydrocephalus or extra-axial fluid collection. Pituitary gland and suprasellar region within normal limits. Vascular: Major intracranial vascular flow voids are maintained. Skull and upper cervical spine: Craniocervical junction  within normal limits. Bone marrow signal intensity normal. Postoperative changes partially visualize within the upper cervical spine. No scalp soft tissue abnormality. Sinuses/Orbits: Prior bilateral ocular lens replacement. Paranasal sinuses are largely clear. Trace left mastoid effusion, of doubtful significance. Other: None. IMPRESSION: 1. No acute intracranial abnormality. 2. Mild chronic microvascular ischemic disease for age. 3. 7 mm extra-axial mass overlying the anterior right frontal convexity, likely a small meningioma. Possible additional small en plaque meningioma measuring 6 mm overlying the right temporal convexity.  No associated edema or mass effect about these lesions. Electronically Signed   By: Rise Mu M.D.   On: 08/19/2023 04:16   CT Head Wo Contrast  Result Date: 08/18/2023 CLINICAL DATA:  Syncope/presyncope, cerebrovascular cause suspected. Nausea EXAM: CT HEAD WITHOUT CONTRAST TECHNIQUE: Contiguous axial images were obtained from the base of the skull through the vertex without intravenous contrast. RADIATION DOSE REDUCTION: This exam was performed according to the departmental dose-optimization program which includes automated exposure control, adjustment of the mA and/or kV according to patient size and/or use of iterative reconstruction technique. COMPARISON:  11/24/2018 FINDINGS: Brain: Normal anatomic configuration. Parenchymal volume loss is commensurate with the patient's age. Mild periventricular white matter changes are present likely reflecting the sequela of small vessel ischemia. No abnormal intra or extra-axial mass lesion or fluid collection. No abnormal mass effect or midline shift. No evidence of acute intracranial hemorrhage or infarct. Ventricular size is normal. Cerebellum unremarkable. Vascular: No asymmetric hyperdense vasculature at the skull base. Skull: Intact Sinuses/Orbits: Paranasal sinuses are clear. Orbits are unremarkable. Other: Mastoid air cells  and middle ear cavities are clear. IMPRESSION: 1. No acute intracranial hemorrhage or infarct. 2. Mild senescent change. Electronically Signed   By: Helyn Numbers M.D.   On: 08/18/2023 22:58      Time coordinating discharge:over 30 minutes  SIGNED:  Sunnie Nielsen DO [facility]

## 2023-08-19 NOTE — Progress Notes (Signed)
PHARMACY - ANTICOAGULATION CONSULT NOTE  Pharmacy Consult for Warfarin Indication: atrial fibrillation  Allergies  Allergen Reactions   Ace Inhibitors Cough   Beta Adrenergic Blockers Other (See Comments)   Morphine And Codeine Nausea And Vomiting   Paroxetine Hcl     Other reaction(s): Dizziness   Peanut-Containing Drug Products Hives   Codeine Nausea Only and Rash   Patient Measurements: Height: 5\' 5"  (165.1 cm) Weight: 59 kg (130 lb) IBW/kg (Calculated) : 57 Heparin Dosing Weight:  Vital Signs: Temp: 98.2 F (36.8 C) (11/26 0600) Temp Source: Oral (11/26 0600) BP: 151/110 (11/26 0630) Pulse Rate: 118 (11/26 0630)  Labs: Recent Labs    08/18/23 2040 08/19/23 0013 08/19/23 0600  HGB 12.3  --   --   HCT 36.6  --   --   PLT 199  --   --   LABPROT  --  22.3*  --   INR  --  1.9*  --   CREATININE 1.13*  --  1.04*  TROPONINIHS 9 9  --    Estimated Creatinine Clearance: 33 mL/min (A) (by C-G formula based on SCr of 1.04 mg/dL (H)).  Medical History: Past Medical History:  Diagnosis Date   Actinic keratosis 05/17/2008   L upper lip - bx proven   Actinic keratosis 10/06/2014   R mid pretibial med - bx proven   Actinic keratosis 11/23/2020   upper back spinal, bx proven   Anemia    Atrial fibrillation ([name])    B12 deficiency    Basal cell carcinoma 08/08/2009   R ant deltoid lat    Basal cell carcinoma 08/08/2009   R ant deltoid med   Basal cell carcinoma 07/31/2010   R mid pretibial   Basal cell carcinoma 10/06/2014   L distal ant thigh   Basal cell carcinoma 10/11/2015   L med distal tricep near elbow   Basal cell carcinoma 02/19/2017   R prox mandible - excision 05/27/2017   Basal cell carcinoma 04/23/2017   L distal pretibial    Basal cell carcinoma 01/25/2020   R forehead above lat brow   Breast cancer ([name]) 2015   Right   Breast cancer, right breast ([name])    right breast Invasive mammary carcinoma grade 1   Calculus of bile duct without  cholecystitis and without obstruction    Cataracts, bilateral    COPD (chronic obstructive pulmonary disease) ([name])    Hemorrhoids    Hypokalemia 05/16/2022   Lymphopenia    Osteoarthritis    Osteoporosis July 2015   seen on DEXA scan-T score of -2.9 in the left neck femur   Pancytopenia ([name])    Personal history of radiation therapy    Skin cancer    Spastic dysphonia    Squamous cell carcinoma of skin 06/22/2007   R mid pretibial   Squamous cell carcinoma of skin 05/17/2008   R pretibial    Squamous cell carcinoma of skin 08/08/2009   R dorsum prox wrist   Squamous cell carcinoma of skin 10/06/2014   L pretibial    Squamous cell carcinoma of skin 12/08/2017   L pretibial    Squamous cell carcinoma of skin 07/26/2019   L mid lat pretibial    Thrombocytopenia ([name])    Unsteady gait    Zenker's diverticulum    DDI:   PTA meds - ciprofloxacin, levothyroxine, venlafaxine   Assessment: Kelly Bishop is an 89 year old female that presented with acute onset of nausea and severe  dizziness. PMH is significant for paroxysmal atrial fibrillation on warfarin, CKDIIIa, COPD, depression, hypothyroidism, PAD, chronic spine infection on daily ciprofloxacin, and Zenker's diverticulum. Pharmacy has been consulted for warfarin dosing for atrial fibrillation.   Home Regimen - confirmed with patient as 7.5 mg Mon & Wed and 5 mg all other days of the week  Last dose - 5 mg taken 08/17/2023  Goal of Therapy:  INR 2-3 Monitor platelets by anticoagulation protocol: Yes   Plan:  INR slightly subtherapeutic today at 1.9  Last warfarin dose was taken Sunday so anticipate INR will trend down  Give increased dose of warfarin 7.5 mg tonight  Monitor INR daily while on warfarin   Littie Deeds, PharmD Pharmacy Resident  08/19/2023 9:20 AM

## 2023-08-19 NOTE — ED Notes (Signed)
Provider to review chart

## 2023-08-19 NOTE — Hospital Course (Signed)
HPI: Kelly Bishop is a 89 y.o. female with medical history significant for  CKD stage IIIa, paroxysmal A-fib on Coumadin, depression, hypothyroidism, PAD, chronic spine infection on daily ofloxacin, Zenker's diverticulum, being admitted with acute vertigo following a vigorous episode of sneezing, with incidental finding of low potassium of 2.4 on workup.   Patient said she sneezed a couple of times and suddenly the room started spinning and she had nausea and then had a sudden urge to have a bowel movement and states it was like diarrhea.  States she was previously in her usual state of health.  Symptoms had improved by the time of my evaluation but states she was feeling a bit of discomfort to the back of her head.  She also reports that she "feels A-fib" every morning.  She denies chest pain or shortness of breath or palpitations.   Hospital course / significant events:  ***  Consultants:  ***  Procedures/Surgeries: ***      ASSESSMENT & PLAN:       *** based on BMI: Body mass index is 21.63 kg/m.  Underweight - under 18.5  normal weight - 18.5 to 24.9 overweight - 25 to 29.9 obese - 30 or more   DVT prophylaxis: *** IV fluids: *** continuous IV fluids  Nutrition: *** Central lines / invasive devices: ***  Code Status: *** ACP documentation reviewed: *** none on file in [facility]  TOC needs: *** Barriers to dispo / significant pending items: ***

## 2023-08-19 NOTE — ED Notes (Signed)
New EKG ordered and completed, provider aware

## 2023-08-26 DIAGNOSIS — Z7901 Long term (current) use of anticoagulants: Secondary | ICD-10-CM | POA: Diagnosis not present

## 2023-08-26 DIAGNOSIS — I482 Chronic atrial fibrillation, unspecified: Secondary | ICD-10-CM | POA: Diagnosis not present

## 2023-08-28 DIAGNOSIS — Z09 Encounter for follow-up examination after completed treatment for conditions other than malignant neoplasm: Secondary | ICD-10-CM | POA: Diagnosis not present

## 2023-08-28 DIAGNOSIS — D329 Benign neoplasm of meninges, unspecified: Secondary | ICD-10-CM | POA: Diagnosis not present

## 2023-08-28 DIAGNOSIS — I10 Essential (primary) hypertension: Secondary | ICD-10-CM | POA: Diagnosis not present

## 2023-08-28 DIAGNOSIS — R42 Dizziness and giddiness: Secondary | ICD-10-CM | POA: Diagnosis not present

## 2023-08-28 DIAGNOSIS — G44209 Tension-type headache, unspecified, not intractable: Secondary | ICD-10-CM | POA: Diagnosis not present

## 2023-08-28 DIAGNOSIS — E876 Hypokalemia: Secondary | ICD-10-CM | POA: Diagnosis not present

## 2023-08-28 DIAGNOSIS — I482 Chronic atrial fibrillation, unspecified: Secondary | ICD-10-CM | POA: Diagnosis not present

## 2023-09-30 DIAGNOSIS — Z7901 Long term (current) use of anticoagulants: Secondary | ICD-10-CM | POA: Diagnosis not present

## 2023-09-30 DIAGNOSIS — I482 Chronic atrial fibrillation, unspecified: Secondary | ICD-10-CM | POA: Diagnosis not present

## 2023-10-14 DIAGNOSIS — E782 Mixed hyperlipidemia: Secondary | ICD-10-CM | POA: Diagnosis not present

## 2023-10-14 DIAGNOSIS — E538 Deficiency of other specified B group vitamins: Secondary | ICD-10-CM | POA: Diagnosis not present

## 2023-10-14 DIAGNOSIS — I482 Chronic atrial fibrillation, unspecified: Secondary | ICD-10-CM | POA: Diagnosis not present

## 2023-10-21 DIAGNOSIS — R739 Hyperglycemia, unspecified: Secondary | ICD-10-CM | POA: Diagnosis not present

## 2023-10-21 DIAGNOSIS — E782 Mixed hyperlipidemia: Secondary | ICD-10-CM | POA: Diagnosis not present

## 2023-10-21 DIAGNOSIS — D329 Benign neoplasm of meninges, unspecified: Secondary | ICD-10-CM | POA: Diagnosis not present

## 2023-10-21 DIAGNOSIS — F339 Major depressive disorder, recurrent, unspecified: Secondary | ICD-10-CM | POA: Diagnosis not present

## 2023-10-21 DIAGNOSIS — I480 Paroxysmal atrial fibrillation: Secondary | ICD-10-CM | POA: Diagnosis not present

## 2023-10-21 DIAGNOSIS — F3341 Major depressive disorder, recurrent, in partial remission: Secondary | ICD-10-CM | POA: Diagnosis not present

## 2023-10-21 DIAGNOSIS — I482 Chronic atrial fibrillation, unspecified: Secondary | ICD-10-CM | POA: Diagnosis not present

## 2023-10-21 DIAGNOSIS — E538 Deficiency of other specified B group vitamins: Secondary | ICD-10-CM | POA: Diagnosis not present

## 2023-10-21 DIAGNOSIS — I739 Peripheral vascular disease, unspecified: Secondary | ICD-10-CM | POA: Diagnosis not present

## 2023-10-21 DIAGNOSIS — J431 Panlobular emphysema: Secondary | ICD-10-CM | POA: Diagnosis not present

## 2023-10-28 DIAGNOSIS — M542 Cervicalgia: Secondary | ICD-10-CM | POA: Diagnosis not present

## 2023-10-28 DIAGNOSIS — M7912 Myalgia of auxiliary muscles, head and neck: Secondary | ICD-10-CM | POA: Diagnosis not present

## 2023-11-18 DIAGNOSIS — Z7901 Long term (current) use of anticoagulants: Secondary | ICD-10-CM | POA: Diagnosis not present

## 2023-11-18 DIAGNOSIS — I482 Chronic atrial fibrillation, unspecified: Secondary | ICD-10-CM | POA: Diagnosis not present

## 2023-12-16 DIAGNOSIS — Z7901 Long term (current) use of anticoagulants: Secondary | ICD-10-CM | POA: Diagnosis not present

## 2023-12-16 DIAGNOSIS — I482 Chronic atrial fibrillation, unspecified: Secondary | ICD-10-CM | POA: Diagnosis not present

## 2024-01-13 DIAGNOSIS — I482 Chronic atrial fibrillation, unspecified: Secondary | ICD-10-CM | POA: Diagnosis not present

## 2024-01-13 DIAGNOSIS — Z7901 Long term (current) use of anticoagulants: Secondary | ICD-10-CM | POA: Diagnosis not present

## 2024-01-19 ENCOUNTER — Emergency Department

## 2024-01-19 ENCOUNTER — Other Ambulatory Visit: Payer: Self-pay

## 2024-01-19 ENCOUNTER — Emergency Department
Admission: EM | Admit: 2024-01-19 | Discharge: 2024-01-19 | Disposition: A | Discharge status: Home / Self Care | Attending: Emergency Medicine | Admitting: Emergency Medicine

## 2024-01-19 DIAGNOSIS — Z85828 Personal history of other malignant neoplasm of skin: Secondary | ICD-10-CM | POA: Insufficient documentation

## 2024-01-19 DIAGNOSIS — S52121A Displaced fracture of head of right radius, initial encounter for closed fracture: Secondary | ICD-10-CM | POA: Diagnosis not present

## 2024-01-19 DIAGNOSIS — Z853 Personal history of malignant neoplasm of breast: Secondary | ICD-10-CM | POA: Diagnosis not present

## 2024-01-19 DIAGNOSIS — E876 Hypokalemia: Secondary | ICD-10-CM | POA: Diagnosis not present

## 2024-01-19 DIAGNOSIS — S42001A Fracture of unspecified part of right clavicle, initial encounter for closed fracture: Secondary | ICD-10-CM | POA: Insufficient documentation

## 2024-01-19 DIAGNOSIS — S42021A Displaced fracture of shaft of right clavicle, initial encounter for closed fracture: Secondary | ICD-10-CM | POA: Diagnosis not present

## 2024-01-19 DIAGNOSIS — Z91010 Allergy to peanuts: Secondary | ICD-10-CM | POA: Insufficient documentation

## 2024-01-19 DIAGNOSIS — R519 Headache, unspecified: Secondary | ICD-10-CM | POA: Diagnosis not present

## 2024-01-19 DIAGNOSIS — J449 Chronic obstructive pulmonary disease, unspecified: Secondary | ICD-10-CM | POA: Insufficient documentation

## 2024-01-19 DIAGNOSIS — M19011 Primary osteoarthritis, right shoulder: Secondary | ICD-10-CM | POA: Diagnosis not present

## 2024-01-19 DIAGNOSIS — Z96653 Presence of artificial knee joint, bilateral: Secondary | ICD-10-CM | POA: Insufficient documentation

## 2024-01-19 DIAGNOSIS — S43014A Anterior dislocation of right humerus, initial encounter: Secondary | ICD-10-CM | POA: Diagnosis not present

## 2024-01-19 DIAGNOSIS — M47812 Spondylosis without myelopathy or radiculopathy, cervical region: Secondary | ICD-10-CM | POA: Diagnosis not present

## 2024-01-19 DIAGNOSIS — S53104A Unspecified dislocation of right ulnohumeral joint, initial encounter: Secondary | ICD-10-CM | POA: Diagnosis not present

## 2024-01-19 DIAGNOSIS — W01198A Fall on same level from slipping, tripping and stumbling with subsequent striking against other object, initial encounter: Secondary | ICD-10-CM | POA: Diagnosis not present

## 2024-01-19 DIAGNOSIS — S199XXA Unspecified injury of neck, initial encounter: Secondary | ICD-10-CM | POA: Diagnosis not present

## 2024-01-19 DIAGNOSIS — S42031A Displaced fracture of lateral end of right clavicle, initial encounter for closed fracture: Secondary | ICD-10-CM | POA: Diagnosis not present

## 2024-01-19 DIAGNOSIS — M25421 Effusion, right elbow: Secondary | ICD-10-CM | POA: Diagnosis not present

## 2024-01-19 DIAGNOSIS — W19XXXA Unspecified fall, initial encounter: Secondary | ICD-10-CM

## 2024-01-19 DIAGNOSIS — R220 Localized swelling, mass and lump, head: Secondary | ICD-10-CM | POA: Diagnosis not present

## 2024-01-19 DIAGNOSIS — Z981 Arthrodesis status: Secondary | ICD-10-CM | POA: Diagnosis not present

## 2024-01-19 DIAGNOSIS — Z7901 Long term (current) use of anticoagulants: Secondary | ICD-10-CM | POA: Insufficient documentation

## 2024-01-19 DIAGNOSIS — M25521 Pain in right elbow: Secondary | ICD-10-CM | POA: Diagnosis present

## 2024-01-19 DIAGNOSIS — M5030 Other cervical disc degeneration, unspecified cervical region: Secondary | ICD-10-CM | POA: Diagnosis not present

## 2024-01-19 LAB — CBC WITH DIFFERENTIAL/PLATELET
Abs Immature Granulocytes: 0.01 10*3/uL (ref 0.00–0.07)
Basophils Absolute: 0 10*3/uL (ref 0.0–0.1)
Basophils Relative: 0 %
Eosinophils Absolute: 0.2 10*3/uL (ref 0.0–0.5)
Eosinophils Relative: 3 %
HCT: 40.7 % (ref 36.0–46.0)
Hemoglobin: 13.5 g/dL (ref 12.0–15.0)
Immature Granulocytes: 0 %
Lymphocytes Relative: 17 %
Lymphs Abs: 1.1 10*3/uL (ref 0.7–4.0)
MCH: 31.3 pg (ref 26.0–34.0)
MCHC: 33.2 g/dL (ref 30.0–36.0)
MCV: 94.2 fL (ref 80.0–100.0)
Monocytes Absolute: 0.5 10*3/uL (ref 0.1–1.0)
Monocytes Relative: 9 %
Neutro Abs: 4.2 10*3/uL (ref 1.7–7.7)
Neutrophils Relative %: 71 %
Platelets: 219 10*3/uL (ref 150–400)
RBC: 4.32 MIL/uL (ref 3.87–5.11)
RDW: 13 % (ref 11.5–15.5)
WBC: 6 10*3/uL (ref 4.0–10.5)
nRBC: 0 % (ref 0.0–0.2)

## 2024-01-19 LAB — COMPREHENSIVE METABOLIC PANEL WITH GFR
ALT: 14 U/L (ref 0–44)
AST: 27 U/L (ref 15–41)
Albumin: 3.4 g/dL — ABNORMAL LOW (ref 3.5–5.0)
Alkaline Phosphatase: 45 U/L (ref 38–126)
Anion gap: 14 (ref 5–15)
BUN: 25 mg/dL — ABNORMAL HIGH (ref 8–23)
CO2: 25 mmol/L (ref 22–32)
Calcium: 8.9 mg/dL (ref 8.9–10.3)
Chloride: 101 mmol/L (ref 98–111)
Creatinine, Ser: 1.35 mg/dL — ABNORMAL HIGH (ref 0.44–1.00)
GFR, Estimated: 37 mL/min — ABNORMAL LOW (ref >60)
Glucose, Bld: 115 mg/dL — ABNORMAL HIGH (ref 70–99)
Potassium: 2.8 mmol/L — ABNORMAL LOW (ref 3.5–5.1)
Sodium: 140 mmol/L (ref 135–145)
Total Bilirubin: 0.6 mg/dL (ref 0.0–1.2)
Total Protein: 6.5 g/dL (ref 6.5–8.1)

## 2024-01-19 LAB — PROTIME-INR
INR: 3.6 — ABNORMAL HIGH (ref 0.8–1.2)
Prothrombin Time: 36.2 s — ABNORMAL HIGH (ref 11.4–15.2)

## 2024-01-19 LAB — TROPONIN I (HIGH SENSITIVITY): Troponin I (High Sensitivity): 14 ng/L (ref <18)

## 2024-01-19 MED ORDER — POTASSIUM CHLORIDE 10 MEQ/100ML IV SOLN
10.0000 meq | Freq: Once | INTRAVENOUS | Status: AC
  Administered 2024-01-19: 10 meq via INTRAVENOUS
  Filled 2024-01-19: qty 100

## 2024-01-19 MED ORDER — ETOMIDATE 2 MG/ML IV SOLN: 10.0000 mg | Freq: Once | INTRAVENOUS | Status: AC

## 2024-01-19 MED ORDER — ETOMIDATE 2 MG/ML IV SOLN
INTRAVENOUS | Status: AC
  Administered 2024-01-19: 10 mg via INTRAVENOUS
  Filled 2024-01-19: qty 10

## 2024-01-19 MED ORDER — FENTANYL CITRATE PF 50 MCG/ML IJ SOSY
25.0000 ug | PREFILLED_SYRINGE | Freq: Once | INTRAMUSCULAR | Status: AC
  Administered 2024-01-19: 25 ug via INTRAVENOUS
  Filled 2024-01-19: qty 1

## 2024-01-19 MED ORDER — FENTANYL CITRATE PF 50 MCG/ML IJ SOSY
50.0000 ug | PREFILLED_SYRINGE | Freq: Once | INTRAMUSCULAR | Status: AC
  Administered 2024-01-19: 50 ug via INTRAVENOUS
  Filled 2024-01-19: qty 1

## 2024-01-19 MED ORDER — ETOMIDATE 2 MG/ML IV SOLN
10.0000 mg | Freq: Once | INTRAVENOUS | Status: AC
  Administered 2024-01-19: 6 mg via INTRAVENOUS
  Filled 2024-01-19: qty 10

## 2024-01-19 MED ORDER — MIDAZOLAM HCL 2 MG/2ML IJ SOLN
2.0000 mg | Freq: Once | INTRAMUSCULAR | Status: AC
  Administered 2024-01-19: 2 mg via INTRAVENOUS
  Filled 2024-01-19: qty 2

## 2024-01-19 MED ORDER — HYDROCODONE-ACETAMINOPHEN 5-325 MG PO TABS: 1.0000 | ORAL_TABLET | Freq: Four times a day (QID) | ORAL | 0 refills | Status: AC | PRN

## 2024-01-19 NOTE — ED Notes (Signed)
 D/C and RX discussed with pt as well as ortho f/up, Sister Ivin Marrow to take pt home. Sling in place. Pt escorted to lobby via wheelchair.

## 2024-01-19 NOTE — ED Notes (Addendum)
 Pt is dr and oriented x4. She is talking with staff.

## 2024-01-19 NOTE — ED Notes (Signed)
 Pt awake and alert at this time, pt is A&Ox4 at this time. Pt denies needs at this time. Call bell in reach, IV K+ infusing at this time.

## 2024-01-19 NOTE — Sedation Documentation (Signed)
 Patient is resting comfortably.

## 2024-01-19 NOTE — ED Provider Notes (Signed)
 [facility] Provider Note    Event Date/Time   First MD Initiated Contact with Patient 01/19/24 [phone]     (approximate)   History   Fall   HPI  Kelly Bishop is a 89 y.o. female who presents to the ED from home status post mechanical fall with right arm and left sided head pain.  Patient was opening the door to let her dog again, got tripped up and fell, striking the left side of her head.  She does take warfarin for atrial fibrillation.  Denies LOC.  Reports right, dominant, shoulder and elbow pain.  Denies vision changes, headache, neck pain, chest pain, shortness of breath, nausea, vomiting or dizziness.     Past Medical History   Past Medical History:  Diagnosis Date  . Actinic keratosis 05/17/2008   L upper lip - bx proven  . Actinic keratosis 10/06/2014   R mid pretibial med - bx proven  . Actinic keratosis 11/23/2020   upper back spinal, bx proven  . Anemia   . Atrial fibrillation ([name])   . B12 deficiency   . Basal cell carcinoma 08/08/2009   R ant deltoid lat   . Basal cell carcinoma 08/08/2009   R ant deltoid med  . Basal cell carcinoma 07/31/2010   R mid pretibial  . Basal cell carcinoma 10/06/2014   L distal ant thigh  . Basal cell carcinoma 10/11/2015   L med distal tricep near elbow  . Basal cell carcinoma 02/19/2017   R prox mandible - excision 05/27/2017  . Basal cell carcinoma 04/23/2017   L distal pretibial   . Basal cell carcinoma 01/25/2020   R forehead above lat brow  . Breast cancer ([name]) 2015   Right  . Breast cancer, right breast ([name])    right breast Invasive mammary carcinoma grade 1  . Calculus of bile duct without cholecystitis and without obstruction   . Cataracts, bilateral   . COPD (chronic obstructive pulmonary disease) ([name])   . Hemorrhoids   . Hypokalemia 05/16/2022  . Lymphopenia   . Osteoarthritis   . Osteoporosis July 2015   seen on DEXA scan-T score of -2.9 in the left neck femur  .  Pancytopenia ([name])   . Personal history of radiation therapy   . Skin cancer   . Spastic dysphonia   . Squamous cell carcinoma of skin 06/22/2007   R mid pretibial  . Squamous cell carcinoma of skin 05/17/2008   R pretibial   . Squamous cell carcinoma of skin 08/08/2009   R dorsum prox wrist  . Squamous cell carcinoma of skin 10/06/2014   L pretibial   . Squamous cell carcinoma of skin 12/08/2017   L pretibial   . Squamous cell carcinoma of skin 07/26/2019   L mid lat pretibial   . Thrombocytopenia ([name])   . Unsteady gait   . Zenker's diverticulum      Active Problem List   Patient Active Problem List   Diagnosis Date Noted  . Hypokalemia 08/18/2023  . Vertigo 08/18/2023  . Hydronephrosis with obstructing calculus, right 05/13/2022  . Cystic mass of pancreas 05/13/2022  . COPD (chronic obstructive pulmonary disease) ([name])   . Acute pyelonephritis   . Cholelithiasis with choledocholithiasis   . Long term (current) use of antibiotics   . Acquired hypothyroidism 09/22/2019  . Chronic kidney disease, stage 3a ([name]) 09/09/2018  . PAD (peripheral artery disease) ([name]) 09/09/2018  . Recurrent major depressive disorder, in partial remission (  [name]) 09/01/2018  . Osteoporosis 10/21/2016  . Carcinoma of overlapping sites of right breast in female, estrogen receptor positive ([name]) 07/22/2016  . Benign essential hypertension 05/15/2016  . Hyperlipidemia, mixed 05/15/2016  . History of breast cancer 08/29/2014  . Atrial fibrillation ([name]) 10/29/2011     Past Surgical History   Past Surgical History:  Procedure Laterality Date  . BREAST EXCISIONAL BIOPSY Left    x 20 years surgical bx  . BREAST EXCISIONAL BIOPSY Left 01/24/2016   ultrasound - Papaloma  . BREAST LUMPECTOMY Right 2015  . BREAST LUMPECTOMY WITH NEEDLE LOCALIZATION Left 03/12/2016   Procedure: BREAST LUMPECTOMY WITH NEEDLE LOCALIZATION;  Surgeon: Benancio Bracket, MD;  Location: [facility] ORS;  Service: General;   Laterality: Left;  . ERCP N/A 05/14/2022   Procedure: ENDOSCOPIC RETROGRADE CHOLANGIOPANCREATOGRAPHY (ERCP);  Surgeon: Marnee Sink, MD;  Location: [facility] ENDOSCOPY;  Service: Endoscopy;  Laterality: N/A;  . HYSTEROSCOPY WITH D & C N/A 07/05/2016   Procedure: DILATATION AND CURETTAGE /HYSTEROSCOPY;  Surgeon: Prescilla Brod, MD;  Location: [facility] ORS;  Service: Gynecology;  Laterality: N/A;  . L1-L4 laminectomy and T6 fusion  May 2011   complicated by postoperative infection, more recently had recurrent pseudomonas infection at scar site March 2015  . REPLACEMENT TOTAL KNEE Bilateral   . ROTATOR CUFF REPAIR Bilateral   . ultrasound guided biopsy of breast  Jan 27, 2014     Home Medications   Prior to Admission medications   Medication Sig Start Date End Date Taking? Authorizing Provider  HYDROcodone-acetaminophen  (NORCO/VICODIN) 5-325 MG tablet Take 1 tablet by mouth every 6 (six) hours as needed for moderate pain (pain score 4-6). 01/19/24  Yes Norlene Beavers, MD  ALPRAZolam  (XANAX ) 0.5 MG tablet Take 0.25 mg by mouth at bedtime.    [provider]  bisacodyl  (DULCOLAX) 5 MG EC tablet Take 10 mg by mouth at bedtime.     [provider]  Calcium Carbonate-Vit D-Min (CALCIUM 600+D PLUS MINERALS) 600-400 MG-UNIT CHEW Chew 1 Dose by mouth daily.     [provider]  Cholecalciferol 2000 units CAPS Take 1 capsule by mouth daily.    [provider]  ciprofloxacin  (CIPRO ) 500 MG tablet Take 500 mg by mouth 2 (two) times daily.     [provider]  Cyanocobalamin  1000 MCG TBCR Take 1,000 mcg by mouth daily.     [provider]  fexofenadine (ALLEGRA) 180 MG tablet Take 180 mg by mouth daily.    [provider]  furosemide (LASIX) 40 MG tablet Take 40 mg by mouth daily as needed.     [provider]  levothyroxine  (SYNTHROID ) 50 MCG tablet Take 50 mcg by mouth every morning. 03/09/22   [provider]  potassium chloride   (KLOR-CON ) 10 MEQ tablet Take 10 mEq by mouth daily as needed (When taking Lasix). 06/19/23   [provider]  venlafaxine  XR (EFFEXOR -XR) 37.5 MG 24 hr capsule Take 37.5 mg by mouth daily. 04/21/22   [provider]  verapamil  (CALAN -SR) 120 MG CR tablet Take 120 mg by mouth daily.    [provider]  warfarin (COUMADIN ) 5 MG tablet Take 5-7.5 mg by mouth daily. 05/29/22   [provider]     Allergies  Ace inhibitors, Beta adrenergic blockers, Morphine  and codeine, Paroxetine hcl, Peanut-containing drug products, and Codeine   Family History   Family History  Problem Relation Age of Onset  . Throat cancer Mother   . Prostate cancer Brother   .  Breast cancer Brother   . Leukemia Brother   . Lung cancer Father      Physical Exam  Triage Vital Signs: ED Triage Vitals  Encounter Vitals Group     BP 01/19/24 0159 (!) 143/73     Systolic BP Percentile --      Diastolic BP Percentile --      Pulse Rate 01/19/24 0159 73     Resp 01/19/24 0159 20     Temp 01/19/24 0203 98.4 F (36.9 C)     Temp src --      SpO2 01/19/24 0159 94 %     Weight 01/19/24 0200 133 lb (60.3 kg)     Height 01/19/24 0200 5\' 3"  (1.6 m)     Head Circumference --      Peak Flow --      Pain Score 01/19/24 0200 7     Pain Loc --      Pain Education --      Exclude from Growth Chart --     Updated Vital Signs: BP (!) 151/75   Pulse 85   Temp 98.2 F (36.8 C) (Axillary)   Resp 20   Ht 5\' 3"  (1.6 m)   Wt 60.3 kg   SpO2 94%   BMI 23.56 kg/m    General: Awake, mild distress.  CV:  RRR.  Good peripheral perfusion.  Resp:  Normal effort.  CTAB. Abd:  Nontender.  No distention.  Other:  Head is atraumatic.  PERRL.  EOMI.  Nose is atraumatic.  No dental malocclusion.  No midline cervical spine tenderness to palpation, step-offs or deformities noted.  Right clavicle tenderness to palpation.  Limited range of motion right shoulder secondary to pain.  Deformity noted to  right elbow.  Limited range of motion secondary to pain.  2+ radial pulse.  Brisk, less than 5-second cap refill.  Equal grip strength compared to the left.   ED Results / Procedures / Treatments  Labs (all labs ordered are listed, but only abnormal results are displayed) Labs Reviewed  COMPREHENSIVE METABOLIC PANEL WITH GFR - Abnormal; Notable for the following components:      Result Value   Potassium 2.8 (*)    Glucose, Bld 115 (*)    BUN 25 (*)    Creatinine, Ser 1.35 (*)    Albumin 3.4 (*)    GFR, Estimated 37 (*)    All other components within normal limits  PROTIME-INR - Abnormal; Notable for the following components:   Prothrombin Time 36.2 (*)    INR 3.6 (*)    All other components within normal limits  CBC WITH DIFFERENTIAL/PLATELET  URINALYSIS, ROUTINE W REFLEX MICROSCOPIC  TROPONIN I (HIGH SENSITIVITY)     EKG  ED ECG REPORT I, Wally Behan J, the attending physician, personally viewed and interpreted this ECG.   Date: 01/19/2024  EKG Time: 0413  Rate: 77  Rhythm: normal sinus rhythm  Axis: Normal  Intervals:none  ST&T Change: Nonspecific    RADIOLOGY I have independently visualized interpreted patient's imaging studies as well as noted the radiology interpretation:  CT head/cervical spine: No acute traumatic injuries  Right shoulder: Distal right clavicular fracture  Right elbow: Posterior dislocation with fracture  Postreduction: Persistent dislocation  Post reduction: Successful relocation  Official radiology report(s): DG Elbow 2 Views Right Result Date: 01/19/2024 CLINICAL DATA:  Post reduction right elbow EXAM: RIGHT ELBOW - 2 VIEW COMPARISON:  Earlier today FINDINGS: Relocated elbow in the lateral projection. Large elbow  joint effusion, known fracture to the radial head and coronoid process which is blunted. IMPRESSION: Relocated elbow in the lateral projection. Radial head and coronoid fractures. Electronically Signed   By: Ronnette Coke M.D.    On: 01/19/2024 06:18   DG Elbow 2 Views Right Result Date: 01/19/2024 CLINICAL DATA:  Post reduction. EXAM: RIGHT ELBOW - 2 VIEW COMPARISON:  Earlier same day FINDINGS: Single lateral view of the right elbow shows persistent dislocation with joint effusion. Fracture fragment seen on previous imaging less well demonstrated on this single lateral film with some obscuration by fiberglass. IMPRESSION: Persistent dislocation of the right elbow with joint effusion. Electronically Signed   By: Donnal Fusi M.D.   On: 01/19/2024 05:54   DG Elbow 2 Views Right Result Date: 01/19/2024 CLINICAL DATA:  Fall, right elbow pain EXAM: RIGHT ELBOW - 2 VIEW COMPARISON:  None Available. FINDINGS: Posterior elbow dislocation. Superimposed intra-articular fracture of the right radial head with fracture fragments in near anatomic alignment. There is a fracture fragment seen within the soft tissues anterior and inferior to the trochlear groove in keeping with a fracture fragment, possibly arising from the coronoid process.l this measures roughly 15 mm in dimension. Extensive surrounding soft tissue swelling. IMPRESSION: 1. Posterior elbow fracture dislocation. Suspected fracture process within the soft tissues anteroinferior to the trochlear groove. 2. Superimposed intra-articular fracture of the right radial head with fracture fragments in near anatomic alignment. Electronically Signed   By: Worthy Heads M.D.   On: 01/19/2024 03:18   CT CERVICAL SPINE WO CONTRAST Result Date: 01/19/2024 CLINICAL DATA:  Fall, head injury EXAM: CT CERVICAL SPINE WITHOUT CONTRAST TECHNIQUE: Multidetector CT imaging of the cervical spine was performed without intravenous contrast. Multiplanar CT image reconstructions were also generated. RADIATION DOSE REDUCTION: This exam was performed according to the departmental dose-optimization program which includes automated exposure control, adjustment of the mA and/or kV according to patient size  and/or use of iterative reconstruction technique. COMPARISON:  None Available. FINDINGS: Alignment: 2 mm retrolisthesis C2-3 and 2-3 mm anterolisthesis C3-4 and C7-T1 noted. Anterior cervical discectomy and fusion with instrumentation C4-C7 with solid incorporation of interbody bone graft. Overall straightening of the cervical spine. Skull base and vertebrae: Craniocervical alignment is normal. The atlantodental interval is not widened. No acute fracture of the cervical spine. Ankylosis of the left facet joints C6-7 and right facet joints C4-5. Osseous structures are diffusely osteopenic. Soft tissues and spinal canal: Evaluation is slightly limited by streak artifact cervical fusion hardware. No prevertebral fluid or swelling. No visible canal hematoma. Disc levels: Intervertebral disc space narrowing and endplate remodeling at C2-C4 and C6-7 in keeping with changes of advanced degenerative disc disease. Prevertebral soft tissues are not thickened on sagittal reformats. No high-grade canal stenosis. Uncovertebral arthrosis and facet arthrosis results in multilevel moderate to severe neuroforaminal narrowing, most severe bilaterally at C3-4 and on the right at C2-3. Upper chest: Negative. Other: None IMPRESSION: 1. No acute fracture of the cervical spine identified. 2. Anterior cervical discectomy and fusion with instrumentation C4-C7 with solid incorporation of interbody bone graft. 3. Multilevel degenerative disc disease and facet arthrosis resulting in multilevel moderate to severe neuroforaminal narrowing, most severe bilaterally at C3-4 and on the right at C2-3. Electronically Signed   By: Worthy Heads M.D.   On: 01/19/2024 03:15   CT Head Wo Contrast Result Date: 01/19/2024 CLINICAL DATA:  Fall, left head injury/pain EXAM: CT HEAD WITHOUT CONTRAST TECHNIQUE: Contiguous axial images were obtained from the base of the  skull through the vertex without intravenous contrast. RADIATION DOSE REDUCTION: This exam  was performed according to the departmental dose-optimization program which includes automated exposure control, adjustment of the mA and/or kV according to patient size and/or use of iterative reconstruction technique. COMPARISON:  08/18/2023 FINDINGS: Brain: Normal anatomic configuration. Parenchymal volume loss is commensurate with the patient's age. Mild periventricular white matter changes are present likely reflecting the sequela of small vessel ischemia. No abnormal intra or extra-axial mass lesion or fluid collection. No abnormal mass effect or midline shift. No evidence of acute intracranial hemorrhage or infarct. Ventricular size is normal. Cerebellum unremarkable. Vascular: No asymmetric hyperdense vasculature at the skull base. Skull: Intact Sinuses/Orbits: Paranasal sinuses are clear. Orbits are unremarkable. Other: Mastoid air cells and middle ear cavities are clear. Mild left frontal scalp soft tissue swelling. IMPRESSION: 1. Mild left frontal scalp soft tissue swelling. No calvarial fracture. No acute intracranial abnormality. Electronically Signed   By: Worthy Heads M.D.   On: 01/19/2024 03:11   DG Shoulder Right Result Date: 01/19/2024 CLINICAL DATA:  Fall, right arm pain, EXAM: RIGHT SHOULDER - 2+ VIEW COMPARISON:  None Available. FINDINGS: There is an acute, impacted, oblique fracture of the distal right clavicle with 1/2 shaft with dorsal displacement of the distal fracture fragment. Fracture plane involves the right clavicle between the expected insertion of the coracoclavicular and acromioclavicular joint. Acromioclavicular joint space is preserved. There is superior subluxation of the humeral head in keeping with disruption and/or degeneration of rotator cuff. Superimposed moderate degenerative arthritis of the glenohumeral articulation. No frank dislocation. IMPRESSION: 1. Acute, impacted, oblique fracture of the distal right clavicle with dorsal displacement of the distal fracture  fragment. Electronically Signed   By: Worthy Heads M.D.   On: 01/19/2024 03:06     PROCEDURES:  Critical Care performed: No  .1-3 Lead EKG Interpretation  Performed by: Norlene Beavers, MD Authorized by: Norlene Beavers, MD     Interpretation: normal     ECG rate:  73   ECG rate assessment: normal     Rhythm: sinus rhythm     Ectopy: none     Conduction: normal   Comments:     Patient placed on cardiac monitor to evaluate for arrhythmias .Reduction of dislocation  Date/Time: 01/19/2024 5:28 AM  Performed by: Norlene Beavers, MD Authorized by: Norlene Beavers, MD  Consent: Verbal consent obtained. Written consent obtained. Risks and benefits: risks, benefits and alternatives were discussed Consent given by: patient Patient understanding: patient states understanding of the procedure being performed Patient consent: the patient's understanding of the procedure matches consent given Procedure consent: procedure consent matches procedure scheduled Relevant documents: relevant documents present and verified Imaging studies: imaging studies available Required items: required blood products, implants, devices, and special equipment available Patient identity confirmed: verbally with patient Time out: Immediately prior to procedure a "time out" was called to verify the correct patient, procedure, equipment, support staff and site/side marked as required. Local anesthesia used: no  Anesthesia: Local anesthesia used: no  Sedation: Patient sedated: yes Sedatives: etomidate and midazolam  Analgesia: fentanyl  Vitals: Vital signs were monitored during sedation.  Patient tolerance: patient tolerated the procedure well with no immediate complications   .Sedation  Date/Time: 01/19/2024 5:40 AM  Performed by: Norlene Beavers, MD Authorized by: Norlene Beavers, MD   Consent:    Consent obtained:  Written and verbal   Consent given by:  Patient   Risks discussed:  Allergic reaction, prolonged  hypoxia  resulting in organ damage, dysrhythmia, prolonged sedation necessitating reversal, inadequate sedation, respiratory compromise necessitating ventilatory assistance and intubation, nausea and vomiting   Alternatives discussed:  Analgesia without sedation and anxiolysis Universal protocol:    Procedure explained and questions answered to patient or proxy's satisfaction: yes     Relevant documents present and verified: yes     Test results available: yes     Imaging studies available: yes     Required blood products, implants, devices, and special equipment available: yes     Immediately prior to procedure, a time out was called: yes     Patient identity confirmed:  Verbally with patient Indications:    Procedure performed:  Dislocation reduction   Procedure necessitating sedation performed by:  Physician performing sedation Pre-sedation assessment:    Time since last food or drink:  1900   ASA classification: class 3 - patient with severe systemic disease     Mouth opening:  3 or more finger widths   Thyromental distance:  3 finger widths   Mallampati score:  II - soft palate, uvula, fauces visible   Neck mobility: reduced     Pre-sedation assessments completed and reviewed: airway patency, cardiovascular function, hydration status, mental status, nausea/vomiting, pain level, respiratory function and temperature   A pre-sedation assessment was completed prior to the start of the procedure Immediate pre-procedure details:    Reassessment: Patient reassessed immediately prior to procedure     Reviewed: vital signs, relevant labs/tests and NPO status     Verified: bag valve mask available, emergency equipment available, intubation equipment available, IV patency confirmed, oxygen available, reversal medications available and suction available   Procedure details (see MAR for exact dosages):    Preoxygenation:  Nasal cannula   Sedation:  Etomidate and midazolam    Intended level of  sedation: deep   Analgesia:  Fentanyl    Intra-procedure monitoring:  Blood pressure monitoring, cardiac monitor, continuous capnometry, continuous pulse oximetry, frequent LOC assessments and frequent vital sign checks   Intra-procedure events: hypoxia     Intra-procedure management:  Supplemental oxygen   Total Provider sedation time (minutes):  5 Post-procedure details:   A post-sedation assessment was completed following the completion of the procedure.   Attendance: Constant attendance by certified staff until patient recovered     Recovery: Patient returned to pre-procedure baseline     Post-sedation assessments completed and reviewed: airway patency, cardiovascular function, hydration status, mental status, nausea/vomiting, pain level, respiratory function and temperature     Patient is stable for discharge or admission: yes     Procedure completion:  Tolerated well, no immediate complications .Sedation  Date/Time: 01/19/2024 6:22 AM  Performed by: Norlene Beavers, MD Authorized by: Norlene Beavers, MD   Consent:    Consent obtained:  Written and verbal   Consent given by:  Patient   Risks discussed:  Allergic reaction, dysrhythmia, inadequate sedation, nausea, prolonged hypoxia resulting in organ damage, prolonged sedation necessitating reversal, respiratory compromise necessitating ventilatory assistance and intubation and vomiting   Alternatives discussed:  Analgesia without sedation and anxiolysis Universal protocol:    Procedure explained and questions answered to patient or proxy's satisfaction: yes     Relevant documents present and verified: yes     Test results available: yes     Imaging studies available: yes     Required blood products, implants, devices, and special equipment available: yes     Immediately prior to procedure, a time out was called: yes  Patient identity confirmed:  Verbally with patient and arm band Indications:    Procedure performed:  Dislocation  reduction   Procedure necessitating sedation performed by:  Physician performing sedation Pre-sedation assessment:    Time since last food or drink:  1900   ASA classification: class 3 - patient with severe systemic disease     Mouth opening:  3 or more finger widths   Thyromental distance:  3 finger widths   Mallampati score:  II - soft palate, uvula, fauces visible   Neck mobility: reduced     Pre-sedation assessments completed and reviewed: airway patency, cardiovascular function, hydration status, mental status, nausea/vomiting, pain level, respiratory function and temperature   A pre-sedation assessment was completed prior to the start of the procedure Immediate pre-procedure details:    Reassessment: Patient reassessed immediately prior to procedure     Reviewed: vital signs, relevant labs/tests and NPO status     Verified: bag valve mask available, emergency equipment available, intubation equipment available, IV patency confirmed, oxygen available, reversal medications available and suction available   Procedure details (see MAR for exact dosages):    Preoxygenation:  Nasal cannula   Sedation:  Etomidate   Intended level of sedation: deep   Analgesia:  Fentanyl    Intra-procedure monitoring:  Blood pressure monitoring, cardiac monitor, continuous capnometry, continuous pulse oximetry, frequent LOC assessments and frequent vital sign checks   Intra-procedure events: none     Total Provider sedation time (minutes):  1 Post-procedure details:   A post-sedation assessment was completed following the completion of the procedure.   Attendance: Constant attendance by certified staff until patient recovered     Recovery: Patient returned to pre-procedure baseline     Post-sedation assessments completed and reviewed: airway patency, cardiovascular function, hydration status, mental status, nausea/vomiting, pain level, respiratory function and temperature     Patient is stable for discharge  or admission: yes     Procedure completion:  Tolerated well, no immediate complications  Muscle relaxation was not achieved with Fentanyl /Versed  so Etomidate was added with good muscle relaxation.  Right elbow reduced, OCL splint applied.  Will obtain postreduction films.   MEDICATIONS ORDERED IN ED: Medications  potassium chloride  10 mEq in 100 mL IVPB (10 mEq Intravenous New Bag/Given 01/19/24 0542)  potassium chloride  10 mEq in 100 mL IVPB (0 mEq Intravenous Stopped 01/19/24 0541)  fentaNYL  (SUBLIMAZE ) injection 25 mcg (25 mcg Intravenous Given 01/19/24 0349)  fentaNYL  (SUBLIMAZE ) injection 50 mcg (50 mcg Intravenous Given 01/19/24 0509)  midazolam  (VERSED ) injection 2 mg (2 mg Intravenous Given 01/19/24 0513)  etomidate (AMIDATE) injection 10 mg (6 mg Intravenous Not Given 01/19/24 0520)  fentaNYL  (SUBLIMAZE ) injection 50 mcg (50 mcg Intravenous Given 01/19/24 0551)  etomidate (AMIDATE) injection 10 mg (6 mg Intravenous Given 01/19/24 0555)     IMPRESSION / MDM / ASSESSMENT AND PLAN / ED COURSE  I reviewed the triage vital signs and the nursing notes.                             89 year old female who presents with mechanical fall with right shoulder and elbow pain.  Differential diagnosis includes but is not limited to ICH, cervical spine injury, fracture, dislocation, musculoskeletal contusion, etc.  I personally reviewed patient's records and note a PCP office visit from 10/28/2023 for cervicalgia.  Patient's presentation is most consistent with acute complicated illness / injury requiring diagnostic workup.  The patient is on the cardiac  monitor to evaluate for evidence of arrhythmia and/or significant heart rate changes.  Laboratory results remarkable for hypokalemia, will replete via IV.  Will check EKG and troponin.  Imaging studies negative for ICH or cervical spine injury.  Positive for clavicle fracture and right posterior elbow dislocation.  Will prepare to reduce under deep  sedation.  Last food/drink at 7 PM.  Patient or history of anesthesia complications.  Clinical Course as of 01/19/24 0701  Mon Jan 19, 2024  0548 Wet read postreduction film: Still appears dislocated.  Unsure if fracture fragment is preventing elbow from being seated in the socket.  Will attempt second reduction. [JS]  0600 Patient tolerated second procedural sedation well.  Will obtain postreduction films. [JS]  [id number] Successful reduction visualized on plain film x-rays.  Offered hospitalization but patient very much desires to be discharged home.  She will be discharged after completion of IV potassium. [JS]  [id number] Patient remains awake and alert.  Will discharge home on Norco as needed and patient will follow-up closely with orthopedics.  Strict return precautions given.  Patient verbalizes understanding and agrees with plan of care. [JS]    Clinical Course User Index [JS] Norlene Beavers, MD     FINAL CLINICAL IMPRESSION(S) / ED DIAGNOSES   Final diagnoses:  Fall, initial encounter  Hypokalemia  Closed dislocation of right elbow, initial encounter  Closed displaced fracture of right clavicle, unspecified part of clavicle, initial encounter  Closed displaced fracture of head of right radius, initial encounter     Rx / DC Orders   ED Discharge Orders          Ordered    HYDROcodone-acetaminophen  (NORCO/VICODIN) 5-325 MG tablet  Every 6 hours PRN        01/19/24 0635             Note:  This document was prepared using Dragon voice recognition software and may include unintentional dictation errors.   Norlene Beavers, MD 01/19/24 [phone]

## 2024-01-19 NOTE — Sedation Documentation (Signed)
 ED Provider at bedside.

## 2024-01-19 NOTE — ED Triage Notes (Addendum)
 Pt c/o a fall that happened about an hour PTA. Pt reports R arm pain and Left sided head pain. Pt does have a little knot. Pt takes blood thinner for afib. Pt a/o x 4. PERRLA.

## 2024-01-19 NOTE — Discharge Instructions (Addendum)
 You may Tylenol  as needed for pain, Norco as needed for more severe pain.  Keep splint clean and dry.  Wear sling as needed for comfort.  Hold 2 doses of your Warfarin, then resume.  Use your walker to help you balance as you walk.  Return to the ER for worsening symptoms, increased swelling, numbness/tingling or other concerns.

## 2024-01-28 DIAGNOSIS — S52121A Displaced fracture of head of right radius, initial encounter for closed fracture: Secondary | ICD-10-CM | POA: Diagnosis not present

## 2024-01-28 DIAGNOSIS — S53104A Unspecified dislocation of right ulnohumeral joint, initial encounter: Secondary | ICD-10-CM | POA: Diagnosis not present

## 2024-01-28 DIAGNOSIS — S52041G Displaced fracture of coronoid process of right ulna, subsequent encounter for closed fracture with delayed healing: Secondary | ICD-10-CM | POA: Diagnosis not present

## 2024-01-28 DIAGNOSIS — M25521 Pain in right elbow: Secondary | ICD-10-CM | POA: Diagnosis not present

## 2024-01-28 DIAGNOSIS — S53114A Anterior dislocation of right ulnohumeral joint, initial encounter: Secondary | ICD-10-CM | POA: Diagnosis not present

## 2024-02-04 DIAGNOSIS — S53104D Unspecified dislocation of right ulnohumeral joint, subsequent encounter: Secondary | ICD-10-CM | POA: Diagnosis not present

## 2024-02-06 ENCOUNTER — Other Ambulatory Visit: Payer: Self-pay | Admitting: Orthopedic Surgery

## 2024-02-06 DIAGNOSIS — M25521 Pain in right elbow: Secondary | ICD-10-CM

## 2024-02-09 ENCOUNTER — Ambulatory Visit
Admission: RE | Admit: 2024-02-09 | Discharge: 2024-02-09 | Disposition: A | Discharge status: Home / Self Care | Source: Ambulatory Visit | Attending: Orthopedic Surgery | Admitting: Orthopedic Surgery

## 2024-02-09 DIAGNOSIS — M25521 Pain in right elbow: Secondary | ICD-10-CM

## 2024-02-09 DIAGNOSIS — S63071A Subluxation of distal end of right ulna, initial encounter: Secondary | ICD-10-CM | POA: Diagnosis not present

## 2024-02-09 DIAGNOSIS — S52601A Unspecified fracture of lower end of right ulna, initial encounter for closed fracture: Secondary | ICD-10-CM | POA: Diagnosis not present

## 2024-02-09 DIAGNOSIS — S52021A Displaced fracture of olecranon process without intraarticular extension of right ulna, initial encounter for closed fracture: Secondary | ICD-10-CM | POA: Diagnosis not present

## 2024-02-11 DIAGNOSIS — S53104A Unspecified dislocation of right ulnohumeral joint, initial encounter: Secondary | ICD-10-CM | POA: Diagnosis not present

## 2024-02-11 DIAGNOSIS — M25321 Other instability, right elbow: Secondary | ICD-10-CM | POA: Diagnosis not present

## 2024-02-11 DIAGNOSIS — S52041A Displaced fracture of coronoid process of right ulna, initial encounter for closed fracture: Secondary | ICD-10-CM | POA: Diagnosis not present

## 2024-02-11 DIAGNOSIS — S52121A Displaced fracture of head of right radius, initial encounter for closed fracture: Secondary | ICD-10-CM | POA: Diagnosis not present

## 2024-02-11 DIAGNOSIS — G8918 Other acute postprocedural pain: Secondary | ICD-10-CM | POA: Diagnosis not present

## 2024-02-11 DIAGNOSIS — X58XXXA Exposure to other specified factors, initial encounter: Secondary | ICD-10-CM | POA: Diagnosis not present

## 2024-02-11 DIAGNOSIS — S53111A Anterior subluxation of right ulnohumeral joint, initial encounter: Secondary | ICD-10-CM | POA: Diagnosis not present

## 2024-02-11 DIAGNOSIS — Y999 Unspecified external cause status: Secondary | ICD-10-CM | POA: Diagnosis not present

## 2024-02-24 DIAGNOSIS — I482 Chronic atrial fibrillation, unspecified: Secondary | ICD-10-CM | POA: Diagnosis not present

## 2024-02-24 DIAGNOSIS — Z7901 Long term (current) use of anticoagulants: Secondary | ICD-10-CM | POA: Diagnosis not present

## 2024-02-24 DIAGNOSIS — M25521 Pain in right elbow: Secondary | ICD-10-CM | POA: Diagnosis not present

## 2024-03-02 DIAGNOSIS — M25521 Pain in right elbow: Secondary | ICD-10-CM | POA: Diagnosis not present

## 2024-03-02 DIAGNOSIS — S52091D Other fracture of upper end of right ulna, subsequent encounter for closed fracture with routine healing: Secondary | ICD-10-CM | POA: Diagnosis not present

## 2024-03-02 DIAGNOSIS — S53104D Unspecified dislocation of right ulnohumeral joint, subsequent encounter: Secondary | ICD-10-CM | POA: Diagnosis not present

## 2024-03-02 DIAGNOSIS — S52121D Displaced fracture of head of right radius, subsequent encounter for closed fracture with routine healing: Secondary | ICD-10-CM | POA: Diagnosis not present

## 2024-03-12 DIAGNOSIS — Z604 Social exclusion and rejection: Secondary | ICD-10-CM | POA: Diagnosis not present

## 2024-03-12 DIAGNOSIS — Z859 Personal history of malignant neoplasm, unspecified: Secondary | ICD-10-CM | POA: Diagnosis not present

## 2024-03-12 DIAGNOSIS — I4891 Unspecified atrial fibrillation: Secondary | ICD-10-CM | POA: Diagnosis not present

## 2024-03-12 DIAGNOSIS — F32A Depression, unspecified: Secondary | ICD-10-CM | POA: Diagnosis not present

## 2024-03-12 DIAGNOSIS — Z79891 Long term (current) use of opiate analgesic: Secondary | ICD-10-CM | POA: Diagnosis not present

## 2024-03-12 DIAGNOSIS — M800AXD Age-related osteoporosis with current pathological fracture, other site, subsequent encounter for fracture with routine healing: Secondary | ICD-10-CM | POA: Diagnosis not present

## 2024-03-12 DIAGNOSIS — Z9181 History of falling: Secondary | ICD-10-CM | POA: Diagnosis not present

## 2024-03-12 DIAGNOSIS — I499 Cardiac arrhythmia, unspecified: Secondary | ICD-10-CM | POA: Diagnosis not present

## 2024-03-12 DIAGNOSIS — Z7901 Long term (current) use of anticoagulants: Secondary | ICD-10-CM | POA: Diagnosis not present

## 2024-03-23 DIAGNOSIS — M25521 Pain in right elbow: Secondary | ICD-10-CM | POA: Diagnosis not present

## 2024-03-23 DIAGNOSIS — I482 Chronic atrial fibrillation, unspecified: Secondary | ICD-10-CM | POA: Diagnosis not present

## 2024-03-23 DIAGNOSIS — Z7901 Long term (current) use of anticoagulants: Secondary | ICD-10-CM | POA: Diagnosis not present

## 2024-04-06 ENCOUNTER — Other Ambulatory Visit: Payer: Self-pay | Admitting: Internal Medicine

## 2024-04-06 DIAGNOSIS — Z1231 Encounter for screening mammogram for malignant neoplasm of breast: Secondary | ICD-10-CM

## 2024-04-20 DIAGNOSIS — E538 Deficiency of other specified B group vitamins: Secondary | ICD-10-CM | POA: Diagnosis not present

## 2024-04-20 DIAGNOSIS — E782 Mixed hyperlipidemia: Secondary | ICD-10-CM | POA: Diagnosis not present

## 2024-04-20 DIAGNOSIS — I482 Chronic atrial fibrillation, unspecified: Secondary | ICD-10-CM | POA: Diagnosis not present

## 2024-04-20 DIAGNOSIS — R739 Hyperglycemia, unspecified: Secondary | ICD-10-CM | POA: Diagnosis not present

## 2024-04-20 DIAGNOSIS — M25521 Pain in right elbow: Secondary | ICD-10-CM | POA: Diagnosis not present

## 2024-04-27 DIAGNOSIS — E538 Deficiency of other specified B group vitamins: Secondary | ICD-10-CM | POA: Diagnosis not present

## 2024-04-27 DIAGNOSIS — I482 Chronic atrial fibrillation, unspecified: Secondary | ICD-10-CM | POA: Diagnosis not present

## 2024-04-27 DIAGNOSIS — Z0000 Encounter for general adult medical examination without abnormal findings: Secondary | ICD-10-CM | POA: Diagnosis not present

## 2024-04-27 DIAGNOSIS — E782 Mixed hyperlipidemia: Secondary | ICD-10-CM | POA: Diagnosis not present

## 2024-04-27 DIAGNOSIS — F339 Major depressive disorder, recurrent, unspecified: Secondary | ICD-10-CM | POA: Diagnosis not present

## 2024-04-27 DIAGNOSIS — F3341 Major depressive disorder, recurrent, in partial remission: Secondary | ICD-10-CM | POA: Diagnosis not present

## 2024-04-27 DIAGNOSIS — I739 Peripheral vascular disease, unspecified: Secondary | ICD-10-CM | POA: Diagnosis not present

## 2024-04-28 ENCOUNTER — Encounter

## 2024-05-03 ENCOUNTER — Ambulatory Visit
Admission: RE | Admit: 2024-05-03 | Discharge: 2024-05-03 | Disposition: A | Discharge status: Home / Self Care | Source: Ambulatory Visit | Attending: Internal Medicine | Admitting: Internal Medicine

## 2024-05-03 DIAGNOSIS — Z1231 Encounter for screening mammogram for malignant neoplasm of breast: Secondary | ICD-10-CM | POA: Diagnosis not present

## 2024-05-18 DIAGNOSIS — M25521 Pain in right elbow: Secondary | ICD-10-CM | POA: Diagnosis not present

## 2024-05-20 ENCOUNTER — Other Ambulatory Visit: Payer: Self-pay

## 2024-05-20 ENCOUNTER — Emergency Department
Admission: EM | Admit: 2024-05-20 | Discharge: 2024-05-20 | Disposition: A | Discharge status: Home / Self Care | Attending: Emergency Medicine | Admitting: Emergency Medicine

## 2024-05-20 ENCOUNTER — Encounter: Payer: Self-pay | Admitting: Emergency Medicine

## 2024-05-20 ENCOUNTER — Emergency Department

## 2024-05-20 DIAGNOSIS — I10 Essential (primary) hypertension: Secondary | ICD-10-CM | POA: Diagnosis not present

## 2024-05-20 DIAGNOSIS — R918 Other nonspecific abnormal finding of lung field: Secondary | ICD-10-CM | POA: Diagnosis not present

## 2024-05-20 DIAGNOSIS — I4891 Unspecified atrial fibrillation: Secondary | ICD-10-CM | POA: Insufficient documentation

## 2024-05-20 DIAGNOSIS — Z7901 Long term (current) use of anticoagulants: Secondary | ICD-10-CM | POA: Diagnosis not present

## 2024-05-20 DIAGNOSIS — I517 Cardiomegaly: Secondary | ICD-10-CM | POA: Diagnosis not present

## 2024-05-20 DIAGNOSIS — I4719 Other supraventricular tachycardia: Secondary | ICD-10-CM | POA: Diagnosis not present

## 2024-05-20 DIAGNOSIS — I482 Chronic atrial fibrillation, unspecified: Secondary | ICD-10-CM | POA: Diagnosis not present

## 2024-05-20 DIAGNOSIS — R0602 Shortness of breath: Secondary | ICD-10-CM | POA: Diagnosis not present

## 2024-05-20 DIAGNOSIS — E782 Mixed hyperlipidemia: Secondary | ICD-10-CM | POA: Diagnosis not present

## 2024-05-20 DIAGNOSIS — I739 Peripheral vascular disease, unspecified: Secondary | ICD-10-CM | POA: Diagnosis not present

## 2024-05-20 LAB — CBC
HCT: 42.6 % (ref 36.0–46.0)
Hemoglobin: 13.6 g/dL (ref 12.0–15.0)
MCH: 30.6 pg (ref 26.0–34.0)
MCHC: 31.9 g/dL (ref 30.0–36.0)
MCV: 95.9 fL (ref 80.0–100.0)
Platelets: 233 K/uL (ref 150–400)
RBC: 4.44 MIL/uL (ref 3.87–5.11)
RDW: 12.9 % (ref 11.5–15.5)
WBC: 5.7 K/uL (ref 4.0–10.5)
nRBC: 0 % (ref 0.0–0.2)

## 2024-05-20 LAB — TROPONIN I (HIGH SENSITIVITY): Troponin I (High Sensitivity): 11 ng/L (ref <18)

## 2024-05-20 LAB — BASIC METABOLIC PANEL WITH GFR
Anion gap: 12 (ref 5–15)
BUN: 20 mg/dL (ref 8–23)
CO2: 24 mmol/L (ref 22–32)
Calcium: 9.2 mg/dL (ref 8.9–10.3)
Chloride: 106 mmol/L (ref 98–111)
Creatinine, Ser: 1.21 mg/dL — ABNORMAL HIGH (ref 0.44–1.00)
GFR, Estimated: 43 mL/min — ABNORMAL LOW (ref >60)
Glucose, Bld: 115 mg/dL — ABNORMAL HIGH (ref 70–99)
Potassium: 3.1 mmol/L — ABNORMAL LOW (ref 3.5–5.1)
Sodium: 142 mmol/L (ref 135–145)

## 2024-05-20 LAB — PROTIME-INR
INR: 1.7 — ABNORMAL HIGH (ref 0.8–1.2)
Prothrombin Time: 21.3 s — ABNORMAL HIGH (ref 11.4–15.2)

## 2024-05-20 MED ORDER — DILTIAZEM HCL 25 MG/5ML IV SOLN
10.0000 mg | Freq: Once | INTRAVENOUS | Status: AC
  Administered 2024-05-20: 10 mg via INTRAVENOUS
  Filled 2024-05-20: qty 5

## 2024-05-20 MED ORDER — SODIUM CHLORIDE 0.9 % IV BOLUS
500.0000 mL | Freq: Once | INTRAVENOUS | Status: AC
  Administered 2024-05-20: 500 mL via INTRAVENOUS

## 2024-05-20 NOTE — Discharge Instructions (Addendum)
 As we discussed please continue to take your verapamil  as written by your doctor.  Please call your cardiologist today to arrange a follow-up appointment hopefully tomorrow.  Return to the emergency department if your heart rate elevates over 120, if you feel short of breath or develop any chest pain.

## 2024-05-20 NOTE — ED Triage Notes (Signed)
 Patient to ED from doctor's appt for SOB since last PM. EKG at doctors office showing HR in the 140's. Hx of afib- takes blood thinners.

## 2024-05-20 NOTE — ED Provider Notes (Signed)
 [facility] Provider Note    Event Date/Time   First MD Initiated Contact with Patient 05/20/24 1138     (approximate)  History   Chief Complaint: Shortness of Breath  HPI  Kelly Bishop is a 89 y.o. female with a past medical history of proximal atrial fibrillation on Coumadin  and verapamil , presents to the emergency department for rapid heart rate and some shortness of breath.  According to the patient she had a routine doctor's appointment today however she states when she woke up this morning she was feeling short of breath and could tell that she was back in A-fib.  Patient states her A-fib comes and goes but has been present more often recently.  Patient denies any chest pain at any point.  No recent illnesses no fever.  Besides feeling somewhat short of breath washed over denies any other symptoms.  Patient states normally when this happens they will get her heart rate down and let her go home if it is under 100.  Physical Exam   Triage Vital Signs: ED Triage Vitals  Encounter Vitals Group     BP 05/20/24 1110 (!) 125/96     Girls Systolic BP Percentile --      Girls Diastolic BP Percentile --      Boys Systolic BP Percentile --      Boys Diastolic BP Percentile --      Pulse Rate 05/20/24 1110 98     Resp 05/20/24 1110 18     Temp 05/20/24 1110 98.5 F (36.9 C)     Temp Source 05/20/24 1110 Oral     SpO2 05/20/24 1110 97 %     Weight 05/20/24 1108 136 lb (61.7 kg)     Height 05/20/24 1108 5' 1 (1.549 m)     Head Circumference --      Peak Flow --      Pain Score 05/20/24 1108 0     Pain Loc --      Pain Education --      Exclude from Growth Chart --     Most recent vital signs: Vitals:   05/20/24 1110  BP: (!) 125/96  Pulse: 98  Resp: 18  Temp: 98.5 F (36.9 C)  SpO2: 97%    General: Awake, no distress.  CV:  Good peripheral perfusion.  Irregular rhythm rate and rhythm 120 to 130 bpm. Resp:  Normal effort.  Equal breath  sounds bilaterally.  Abd:  No distention.  Soft, nontender.  No rebound or guarding. Other:  No significant lower extremity edema   ED Results / Procedures / Treatments   EKG  EKG viewed and interpreted by myself shows atrial fibrillation with a rapid ventricular response at 141 bpm with a narrow QRS, normal axis, normal intervals, nonspecific but no concerning ST changes.  RADIOLOGY  I have reviewed and interpreted the chest x-ray images.  No obvious consolidation seen on my evaluation. Radiology has read the x-ray is cardiomegaly with tortuous aorta no acute process.  MEDICATIONS ORDERED IN ED: Medications  sodium chloride  0.9 % bolus 500 mL (has no administration in time range)  diltiazem  (CARDIZEM ) injection 10 mg (has no administration in time range)     IMPRESSION / MDM / ASSESSMENT AND PLAN / ED COURSE  I reviewed the triage vital signs and the nursing notes.  Patient's presentation is most consistent with acute presentation with potential threat to life or bodily function.  Patient presents to the emergency  department from her doctor's office for rapid heart rate/shortness of breath.  Patient has a history of paroxysmal atrial fibrillation and takes warfarin as well as verapamil  at home.  Patient appears to be in A-fib RVR with a rate around 120 to 130 bpm during my evaluation as high as 140s however on EKG.  Patient has no other complaints besides feeling some fatigue and slight shortness of breath as she often does when she goes into atrial fibrillation per patient.  No chest pain no illnesses no fever.  Vital signs otherwise reassuring.  We will check labs we will dose a small amount of IV fluids as well as 10 mg of diltiazem  and reassess.  Patient agreeable to plan.  After receiving a single dose of IV diltiazem  patient's heart rate has dropped and has maintained in the 70s for the past 30 minutes to an hour or so.  Patient has no symptoms.  Patient's lab work shows a  reassuring CBC, reassuring chemistry, INR of 1.7.  Troponin normal at 11.  I discussed with the patient to continue to take her verapamil  as prescribed.  To follow-up with her cardiologist by calling today or first thing in the morning.  Discussed return precautions if the patient's heart rate were too elevated or she becomes short of breath or has chest pain  FINAL CLINICAL IMPRESSION(S) / ED DIAGNOSES   Atrial fibrillation with rapid ventricular response   Note:  This document was prepared using Dragon voice recognition software and may include unintentional dictation errors.   Dorothyann Drivers, MD 05/20/24 1313

## 2024-05-25 DIAGNOSIS — I482 Chronic atrial fibrillation, unspecified: Secondary | ICD-10-CM | POA: Diagnosis not present

## 2024-05-25 DIAGNOSIS — Z7901 Long term (current) use of anticoagulants: Secondary | ICD-10-CM | POA: Diagnosis not present

## 2024-05-28 DIAGNOSIS — I482 Chronic atrial fibrillation, unspecified: Secondary | ICD-10-CM | POA: Diagnosis not present

## 2024-05-28 DIAGNOSIS — E876 Hypokalemia: Secondary | ICD-10-CM | POA: Diagnosis not present

## 2024-06-02 DIAGNOSIS — Z7901 Long term (current) use of anticoagulants: Secondary | ICD-10-CM | POA: Diagnosis not present

## 2024-06-02 DIAGNOSIS — I10 Essential (primary) hypertension: Secondary | ICD-10-CM | POA: Diagnosis not present

## 2024-06-02 DIAGNOSIS — I739 Peripheral vascular disease, unspecified: Secondary | ICD-10-CM | POA: Diagnosis not present

## 2024-06-02 DIAGNOSIS — E782 Mixed hyperlipidemia: Secondary | ICD-10-CM | POA: Diagnosis not present

## 2024-06-02 DIAGNOSIS — I482 Chronic atrial fibrillation, unspecified: Secondary | ICD-10-CM | POA: Diagnosis not present

## 2024-06-22 DIAGNOSIS — I482 Chronic atrial fibrillation, unspecified: Secondary | ICD-10-CM | POA: Diagnosis not present

## 2024-06-30 DIAGNOSIS — I482 Chronic atrial fibrillation, unspecified: Secondary | ICD-10-CM | POA: Diagnosis not present

## 2024-06-30 DIAGNOSIS — Z7901 Long term (current) use of anticoagulants: Secondary | ICD-10-CM | POA: Diagnosis not present

## 2024-07-22 DIAGNOSIS — Z0000 Encounter for general adult medical examination without abnormal findings: Secondary | ICD-10-CM | POA: Diagnosis not present

## 2024-07-22 DIAGNOSIS — I482 Chronic atrial fibrillation, unspecified: Secondary | ICD-10-CM | POA: Diagnosis not present

## 2024-07-27 DIAGNOSIS — I482 Chronic atrial fibrillation, unspecified: Secondary | ICD-10-CM | POA: Diagnosis not present

## 2024-08-25 ENCOUNTER — Ambulatory Visit

## 2024-08-25 DIAGNOSIS — D489 Neoplasm of uncertain behavior, unspecified: Secondary | ICD-10-CM | POA: Diagnosis not present

## 2024-08-25 DIAGNOSIS — Z872 Personal history of diseases of the skin and subcutaneous tissue: Secondary | ICD-10-CM | POA: Diagnosis not present

## 2024-08-25 DIAGNOSIS — W908XXA Exposure to other nonionizing radiation, initial encounter: Secondary | ICD-10-CM | POA: Diagnosis not present

## 2024-08-25 DIAGNOSIS — L578 Other skin changes due to chronic exposure to nonionizing radiation: Secondary | ICD-10-CM

## 2024-08-25 DIAGNOSIS — L821 Other seborrheic keratosis: Secondary | ICD-10-CM | POA: Diagnosis not present

## 2024-08-25 DIAGNOSIS — Z85828 Personal history of other malignant neoplasm of skin: Secondary | ICD-10-CM | POA: Diagnosis not present

## 2024-08-25 DIAGNOSIS — L820 Inflamed seborrheic keratosis: Secondary | ICD-10-CM

## 2024-08-25 DIAGNOSIS — C4441 Basal cell carcinoma of skin of scalp and neck: Secondary | ICD-10-CM

## 2024-08-25 DIAGNOSIS — C4442 Squamous cell carcinoma of skin of scalp and neck: Secondary | ICD-10-CM | POA: Diagnosis not present

## 2024-08-25 DIAGNOSIS — L814 Other melanin hyperpigmentation: Secondary | ICD-10-CM

## 2024-08-25 NOTE — Patient Instructions (Addendum)

## 2024-08-25 NOTE — Progress Notes (Signed)
 Subjective   Kelly Bishop is a 89 y.o. female who presents for the following: Lesion(s) of concern . Patient is new patient  Today patient reports: Area of concern on the left side of her neck. Lesion is not painful but has bled multiple times over the past two weeks.   Review of Systems:    No other skin or systemic complaints except as noted in HPI or Assessment and Plan.  The following portions of the chart were reviewed this encounter and updated as appropriate: medications, allergies, medical history  Relevant Medical History:  Personal history of non melanoma skin cancer - see medical history for full details   Objective  (SKPE) Well appearing patient in no apparent distress; mood and affect are within normal limits. Examination was performed of the: Sun Exposed Exam: Scalp, head, eyes, ears, nose, lips, neck, upper extremities, hands, fingers, fingernails  Examination notable for: Lentigo/lentigines: Scattered pigmented macules that are tan to brown in color and are somewhat non-uniform in shape and concentrated in the sun-exposed areas, Seborrheic Keratosis(es): Stuck-on appearing keratotic papule(s) on the trunk, none  irritated with redness, crusting, edema, and/or partial avulsion, Actinic Damage/Elastosis: chronic sun damage: dyspigmentation, telangiectasia, and wrinkling  Examination limited by: Clothing and Patient deferred removal     Left Anterior Neck 1.3cm pink plaque   Right Axilla (2) Stuck on waxy paps with erythema  Assessment & Plan  (SKAP)   BENIGN SKIN FINDINGS  - Lentigines  - Seborrheic keratoses - Reassurance provided regarding the benign appearance of lesions noted on exam today; no treatment is indicated in the absence of symptoms/changes. - Reinforced importance of photoprotective strategies including liberal and frequent sunscreen use of a broad-spectrum SPF 30 or greater, use of protective clothing, and sun avoidance for prevention of  cutaneous malignancy and photoaging.  Counseled patient on the importance of regular self-skin monitoring as well as routine clinical skin examinations as scheduled.   ACTINIC DAMAGE - Chronic condition, secondary to cumulative UV/sun exposure - Recommend daily broad spectrum sunscreen SPF 30+ to sun-exposed areas, reapply every 2 hours as needed.  - Staying in the shade or wearing long sleeves, sun glasses (UVA+UVB protection) and wide brim hats (4-inch brim around the entire circumference of the hat) are also recommended for sun protection.  - Call for new or changing lesions.  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 Would like to minimize excisions / do least invasive tx. Opted to [facility] today in clinic   Level of service outlined above   Patient instructions (SKPI)   Procedures, orders, diagnosis for this visit:  NEOPLASM OF UNCERTAIN BEHAVIOR Left Anterior Neck Skin / nail biopsy Type of biopsy: tangential   Informed consent: discussed and consent obtained   Timeout: patient name, date of birth, surgical site, and procedure verified   Procedure prep:  Patient was prepped and draped in usual sterile fashion Prep type:  Isopropyl alcohol Anesthesia: the lesion was anesthetized in a standard fashion   Anesthetic:  1% lidocaine  w/ epinephrine  1-100,000 buffered w/ 8.4% NaHCO3 Instrument used: DermaBlade   Hemostasis achieved with: pressure and aluminum chloride   Outcome: patient tolerated procedure well   Post-procedure details: sterile dressing applied and wound care instructions given   Dressing type: bandage and petrolatum  Destruction of lesion Complexity: simple   Destruction method: electrodesiccation and curettage   Informed consent: discussed and consent obtained   Timeout:  patient  name, date of birth, surgical site, and procedure verified Procedure prep:  Patient was prepped and draped in usual sterile fashion Prep type:  Isopropyl alcohol Anesthesia: the lesion was anesthetized in a standard fashion   Anesthetic:  1% lidocaine  w/ epinephrine  1-100,000 local infiltration Curettage performed in three different directions: Yes   Electrodesiccation performed over the curetted area: Yes   Curettage cycles:  3 Final wound size (cm):  1.3 Hemostasis achieved with:  aluminum chloride and electrodesiccation Outcome: patient tolerated procedure well with no complications   Post-procedure details: sterile dressing applied and wound care instructions given   Dressing type: bandage and petrolatum    Specimen 1 - Surgical pathology Differential Diagnosis: SCC vs BCC vs Other  Check Margins: No INFLAMED SEBORRHEIC KERATOSIS (2) Right Axilla (2) Symptomatic, irritating, patient would like treated. Destruction of lesion - Right Axilla (2) Complexity: simple   Destruction method: cryotherapy   Informed consent: discussed and consent obtained   Timeout:  patient name, date of birth, surgical site, and procedure verified Lesion destroyed using liquid nitrogen: Yes   Region frozen until ice ball extended beyond lesion: Yes   Cryo cycles: 1 or 2. Outcome: patient tolerated procedure well with no complications   Post-procedure details: wound care instructions given     Neoplasm of uncertain behavior -     Skin / nail biopsy -     Destruction of lesion -     Surgical pathology; Standing  Inflamed seborrheic keratosis -     Destruction of lesion    Return to clinic: Return if symptoms worsen or fail to improve.  I, Emerick Ege, CMA am acting as scribe for Lauraine JAYSON Kanaris, MD.   Documentation: I have reviewed the above documentation for accuracy and completeness, and I agree with the above.  Lauraine JAYSON Kanaris, MD

## 2024-08-26 LAB — SURGICAL PATHOLOGY

## 2024-08-30 ENCOUNTER — Ambulatory Visit: Payer: Self-pay

## 2024-08-30 NOTE — Telephone Encounter (Signed)
-----   Message from Lauraine JAYSON Kanaris sent at 08/30/2024 10:56 AM EST ----- Please notify patient with biopsy results:       1. Skin, left anterior neck :       WELL DIFFERENTIATED SQUAMOUS CELL CARCINOMA   Treated w/ ED&C in clinic See note - pt prefers minimally invasive tx options / no surgeries / no follow up unless lesions are bothersome  ----- Message ----- From: Interface, Lab In [facility] Sent: 08/26/2024   6:19 PM EST To: Lauraine JAYSON Kanaris, MD

## 2024-08-30 NOTE — Progress Notes (Signed)
 LMTRC
